# Patient Record
Sex: Female | Born: 1985 | Hispanic: Yes | Marital: Married | Smoking: Never smoker
Health system: Southern US, Community
[De-identification: ages and names within clinical notes are randomized; demographics above are authoritative.]

## PROBLEM LIST (undated history)

## (undated) ENCOUNTER — Inpatient Hospital Stay (HOSPITAL_COMMUNITY): Payer: Self-pay

## (undated) DIAGNOSIS — O09299 Supervision of pregnancy with other poor reproductive or obstetric history, unspecified trimester: Secondary | ICD-10-CM

## (undated) DIAGNOSIS — O139 Gestational [pregnancy-induced] hypertension without significant proteinuria, unspecified trimester: Secondary | ICD-10-CM

## (undated) DIAGNOSIS — Z789 Other specified health status: Secondary | ICD-10-CM

## (undated) HISTORY — DX: Supervision of pregnancy with other poor reproductive or obstetric history, unspecified trimester: O09.299

## (undated) HISTORY — PX: APPENDECTOMY: SHX54

---

## 2011-06-12 NOTE — L&D Delivery Note (Signed)
Attestation of Attending Supervision of Resident: Evaluation and management procedures were performed by the [facility] Resident under my supervision.  I have seen and examined the patient, reviewed the resident's note and chart, and I agree with the management and plan.  I was present for entire second and third stage of labor.  There were no complications.  Anibal Henderson, M.D. 03/29/2012 1:40 AM

## 2011-06-12 NOTE — L&D Delivery Note (Signed)
Delivery Note At 8:16 PM a viable and healthy female was delivered via Vaginal, Spontaneous Delivery (Presentation: Occiput Anterior).  APGAR: 8,9; weight 7 lb 1.6 oz (3221 g).   Placenta status: spontaneous, intact, with significant calcification.  Cord: 3-vessel.     Anesthesia: Epidural  Episiotomy:none   Lacerations: First degree vaginal laceration (repaired); bilateral periurethral lacerations, hemostatic (unrepaired) Suture Repair: 2.0 vicryl Est. Blood Loss (mL): 700  Mom to postpartum.  Baby to mother's skin. stable.  Felix Pacini 03/28/2012, 8:37 PM

## 2011-08-10 ENCOUNTER — Encounter (HOSPITAL_COMMUNITY): Payer: Self-pay | Admitting: *Deleted

## 2011-08-10 ENCOUNTER — Inpatient Hospital Stay (HOSPITAL_COMMUNITY)
Admission: AD | Admit: 2011-08-10 | Discharge: 2011-08-10 | Disposition: A | Discharge status: Home Health | Payer: Self-pay | Source: Ambulatory Visit | Attending: Family Medicine | Admitting: Family Medicine

## 2011-08-10 ENCOUNTER — Inpatient Hospital Stay (HOSPITAL_COMMUNITY): Payer: Self-pay

## 2011-08-10 DIAGNOSIS — B9689 Other specified bacterial agents as the cause of diseases classified elsewhere: Secondary | ICD-10-CM | POA: Insufficient documentation

## 2011-08-10 DIAGNOSIS — N760 Acute vaginitis: Secondary | ICD-10-CM | POA: Insufficient documentation

## 2011-08-10 DIAGNOSIS — O2390 Unspecified genitourinary tract infection in pregnancy, unspecified trimester: Secondary | ICD-10-CM | POA: Insufficient documentation

## 2011-08-10 DIAGNOSIS — O4690 Antepartum hemorrhage, unspecified, unspecified trimester: Secondary | ICD-10-CM

## 2011-08-10 DIAGNOSIS — A499 Bacterial infection, unspecified: Secondary | ICD-10-CM | POA: Insufficient documentation

## 2011-08-10 DIAGNOSIS — O209 Hemorrhage in early pregnancy, unspecified: Secondary | ICD-10-CM | POA: Insufficient documentation

## 2011-08-10 HISTORY — DX: Other specified health status: Z78.9

## 2011-08-10 LAB — URINALYSIS, ROUTINE W REFLEX MICROSCOPIC
Bilirubin Urine: NEGATIVE
Ketones, ur: NEGATIVE mg/dL
Nitrite: NEGATIVE
Protein, ur: NEGATIVE mg/dL
Urobilinogen, UA: 0.2 mg/dL (ref 0.0–1.0)
pH: 7 (ref 5.0–8.0)

## 2011-08-10 LAB — CBC
HCT: 39 % (ref 36.0–46.0)
Hemoglobin: 13.2 g/dL (ref 12.0–15.0)
MCH: 29.7 pg (ref 26.0–34.0)
MCHC: 33.8 g/dL (ref 30.0–36.0)
MCV: 87.8 fL (ref 78.0–100.0)
Platelets: 234 K/uL (ref 150–400)
RBC: 4.44 MIL/uL (ref 3.87–5.11)
RDW: 12.6 % (ref 11.5–15.5)
WBC: 13.2 K/uL — ABNORMAL HIGH (ref 4.0–10.5)

## 2011-08-10 LAB — WET PREP, GENITAL
Trich, Wet Prep: NONE SEEN
Yeast Wet Prep HPF POC: NONE SEEN

## 2011-08-10 LAB — ABO/RH: ABO/RH(D): AB NEG

## 2011-08-10 MED ORDER — METRONIDAZOLE 500 MG PO TABS: 500.0000 mg | ORAL_TABLET | Freq: Two times a day (BID) | ORAL | Status: AC

## 2011-08-10 MED ORDER — RHO D IMMUNE GLOBULIN 1500 UNIT/2ML IJ SOLN
300.0000 ug | Freq: Once | INTRAMUSCULAR | Status: AC
  Administered 2011-08-10: 300 ug via INTRAMUSCULAR

## 2011-08-10 MED ORDER — ACETAMINOPHEN-CODEINE #3 300-30 MG PO TABS
1.0000 | ORAL_TABLET | Freq: Once | ORAL | Status: AC
  Administered 2011-08-10: 1 via ORAL
  Filled 2011-08-10: qty 1

## 2011-08-10 MED ORDER — FLUCONAZOLE 150 MG PO TABS
150.0000 mg | ORAL_TABLET | Freq: Once | ORAL | Status: AC
  Administered 2011-08-10: 150 mg via ORAL
  Filled 2011-08-10: qty 1

## 2011-08-10 NOTE — ED Provider Notes (Signed)
History     Chief Complaint  Patient presents with  . Vaginal Bleeding  . Abdominal Cramping   HPI  Patient reports light bleeding on toilet paper at 10:00am with wiping. She is also having mild cramping that started at the same time.  No UTI symptoms.  +vaginal itching, no abnormal discharge.     Past Medical History  Diagnosis Date  . No pertinent past medical history     History reviewed. No pertinent past surgical history.  History reviewed. No pertinent family history.  History  Substance Use Topics  . Smoking status: Never Smoker   . Smokeless tobacco: Not on file  . Alcohol Use: No    Allergies: No Known Allergies  Prescriptions prior to admission  Medication Sig Dispense Refill  . Prenatal Vit-Fe Fumarate-FA (PRENATAL MULTIVITAMIN) TABS Take 1 tablet by mouth daily.        Review of Systems  Gastrointestinal: Positive for abdominal pain (cramping).  Genitourinary:       Vaginal spotting; vaginal itching  All other systems reviewed and are negative.   Physical Exam   Blood pressure 105/63, pulse 86, temperature 99.1 F (37.3 C), temperature source Oral, resp. rate 18, height 5' 4.5" (1.638 m), weight 75.524 kg (166 lb 8 oz), last menstrual period 06/14/2011, SpO2 100.00%.  Physical Exam  Constitutional: She is oriented to person, place, and time. She appears well-developed and well-nourished. No distress.  HENT:  Head: Normocephalic.  Neck: Normal range of motion. Neck supple.  Cardiovascular: Normal rate, regular rhythm and normal heart sounds.   Respiratory: Effort normal and breath sounds normal. No respiratory distress.  GI: Soft. She exhibits no mass. There is no tenderness. There is no rebound and no guarding.  Genitourinary: There is bleeding (scant) around the vagina.  Neurological: She is alert and oriented to person, place, and time.  Skin: Skin is warm and dry.    MAU Course  Procedures  Results for orders placed during the hospital  encounter of 08/10/11 (from the past 24 hour(s))  URINALYSIS, ROUTINE W REFLEX MICROSCOPIC     Status: Abnormal   Collection Time   08/10/11  3:45 PM      Component Value Range   Color, Urine YELLOW  YELLOW    APPearance HAZY (*) CLEAR    Specific Gravity, Urine 1.015  1.005 - 1.030    pH 7.0  5.0 - 8.0    Glucose, UA NEGATIVE  NEGATIVE (mg/dL)   Hgb urine dipstick LARGE (*) NEGATIVE    Bilirubin Urine NEGATIVE  NEGATIVE    Ketones, ur NEGATIVE  NEGATIVE (mg/dL)   Protein, ur NEGATIVE  NEGATIVE (mg/dL)   Urobilinogen, UA 0.2  0.0 - 1.0 (mg/dL)   Nitrite NEGATIVE  NEGATIVE    Leukocytes, UA TRACE (*) NEGATIVE   URINE MICROSCOPIC-ADD ON     Status: Abnormal   Collection Time   08/10/11  3:45 PM      Component Value Range   Squamous Epithelial / LPF MANY (*) RARE    WBC, UA 0-2  <3 (WBC/hpf)   Bacteria, UA FEW (*) RARE   ABO/RH     Status: Normal   Collection Time   08/10/11  5:37 PM      Component Value Range   ABO/RH(D) AB NEG    RH IG WORKUP     Status: Normal (Preliminary result)   Collection Time   08/10/11  5:37 PM      Component Value Range   Gestational  Age(Wks) 7     ABO/RH(D) AB NEG     Antibody Screen NEG     Unit Number [id number]     Blood Component Type RHIG     Unit division 00     Status of Unit ISSUED     Transfusion Status OK TO TRANSFUSE    CBC     Status: Abnormal   Collection Time   08/10/11  5:38 PM      Component Value Range   WBC 13.2 (*) 4.0 - 10.5 (K/uL)   RBC 4.44  3.87 - 5.11 (MIL/uL)   Hemoglobin 13.2  12.0 - 15.0 (g/dL)   HCT 63.0  16.0 - 10.9 (%)   MCV 87.8  78.0 - 100.0 (fL)   MCH 29.7  26.0 - 34.0 (pg)   MCHC 33.8  30.0 - 36.0 (g/dL)   RDW 32.3  55.7 - 32.2 (%)   Platelets 234  150 - 400 (K/uL)  HCG, QUANTITATIVE, PREGNANCY     Status: Abnormal   Collection Time   08/10/11  5:42 PM      Component Value Range   hCG, Beta Chain, Quant, S 02542 (*) <5 (mIU/mL)  WET PREP, GENITAL     Status: Abnormal   Collection Time   08/10/11  5:45 PM       Component Value Range   Yeast Wet Prep HPF POC NONE SEEN  NONE SEEN    Trich, Wet Prep NONE SEEN  NONE SEEN    Clue Cells Wet Prep HPF POC FEW (*) NONE SEEN    WBC, Wet Prep HPF POC FEW (*) NONE SEEN      Assessment and Plan  Viable Intrauterine Pregnancy Bleeding in Early Pregnancy Bacterial Vaginosis  Plan: DC to home RX Flagyl Begin prenatal care Bleeding precautions  [facility] 08/10/2011, 5:35 PM

## 2011-08-10 NOTE — Progress Notes (Signed)
Patient states she took a positive home pregnancy test on Feb. 13, 2013 and followed up with positive pregnancy test at [facility] on Feb. 18, 2013.  Patient started some light bleeding on toilet paper at 10:00 am only when she wipes. She is also having mild cramping that started at the same time.

## 2011-08-10 NOTE — Progress Notes (Signed)
Pt states, " I started having light vaginal bleeding at 10 am today with low abdominal cramping. This same happened two weeks ago."

## 2011-08-10 NOTE — Discharge Instructions (Signed)
Prenatal Care Providers °[facility]    [facility] ° Phone- [phone]     Phone: [phone] °         °[facility]                      [facility] ° @[place]     Phone: [phone] ° Phone: [phone] °        [facility] °[facility]     Phone: [phone] ° Phone: [phone]   °        [facility] °[facility] [place]                hone: [phone] ° Phone: [phone] °        [facility] °Dr. Bernard Marshall      Phone: [phone] ° Phone: [phone] °        [facility] °[facility]                Phone: [phone] ° [facility]’s [facility]  ° Phone:[phone]    [facility] °         Phone: [phone] °[facility] °  Phone: [phone] ° ° °Bacterial Vaginosis °Bacterial vaginosis (BV) is a vaginal infection where the normal balance of bacteria in the vagina is disrupted. The normal balance is then replaced by an overgrowth of certain bacteria. There are several different kinds of bacteria that can cause BV. BV is the most common vaginal infection in women of childbearing age. °CAUSES  °· The cause of BV is not fully understood. BV develops when there is an increase or imbalance of harmful bacteria.  °· Some activities or behaviors can upset the normal balance of bacteria in the vagina and put women at increased risk including:  °· Having a new sex partner or multiple sex partners.  °· Douching.  °· Using an intrauterine device (IUD) for contraception.  °· It is not clear what role sexual activity plays in the development of BV. However, women that have never had sexual intercourse are rarely infected with BV.  °Women do not get BV from toilet seats, bedding, swimming pools or from touching objects around them.  °SYMPTOMS  °· Grey vaginal discharge.  °· A fish-like odor with discharge, especially after sexual intercourse.   °· Itching or burning of the vagina and vulva.  °· Burning or pain with urination.  °· Some women have no signs or symptoms at all.  °DIAGNOSIS  °Your caregiver must examine the vagina for signs of BV. Your caregiver will perform lab tests and look at the sample of vaginal fluid through a microscope. They will look for bacteria and abnormal cells (clue cells), a pH test higher than 4.5, and a positive amine test all associated with BV.  °RISKS AND COMPLICATIONS  °· Pelvic inflammatory disease (PID).  °· Infections following gynecology surgery.  °· Developing HIV.  °· Developing herpes virus.  °TREATMENT  °Sometimes BV will clear up without treatment. However, all women with symptoms of BV should be treated to avoid complications, especially if gynecology surgery is planned. Female partners generally do not need to be treated. However, BV may spread between female sex partners so treatment is helpful in preventing a recurrence of BV.  °· BV may be treated with antibiotics. The antibiotics come in either pill or vaginal cream forms.   be used with nonpregnant or pregnant women, but the recommended dosages differ. These antibiotics are not harmful to the baby.   BV can recur after treatment. If this happens, a second round of antibiotics will often be prescribed.   Treatment is important for pregnant women. If not treated, BV can cause a premature delivery, especially for a pregnant woman who had a premature birth in the past. All pregnant women who have symptoms of BV should be checked and treated.   For chronic reoccurrence of BV, treatment with a type of prescribed gel vaginally twice a week is helpful.  HOME CARE INSTRUCTIONS   Finish all medication as directed by your caregiver.   Do not have sex until treatment is completed.   Tell your sexual partner that you have a vaginal infection. They should see their caregiver and be treated if they have problems, such as a mild rash or itching.    Practice safe sex. Use condoms. Only have 1 sex partner.  PREVENTION  Basic prevention steps can help reduce the risk of upsetting the natural balance of bacteria in the vagina and developing BV:  Do not have sexual intercourse (be abstinent).   Do not douche.   Use all of the medicine prescribed for treatment of BV, even if the signs and symptoms go away.   Tell your sex partner if you have BV. That way, they can be treated, if needed, to prevent reoccurrence.  SEEK MEDICAL CARE IF:   Your symptoms are not improving after 3 days of treatment.   You have increased discharge, pain, or fever.  MAKE SURE YOU:   Understand these instructions.   Will watch your condition.   Will get help right away if you are not doing well or get worse.  FOR MORE INFORMATION  Division of STD Prevention (DSTDP), Centers for Disease Control and Prevention: [employer] American Social Health Association (ASHA): www.ashastd.org  Document Released: 05/28/2005 Document Revised: 02/07/2011 Document Reviewed: 11/18/2008 [facility] Patient Information 2012 [place], [place].Bacterial Vaginosis Bacterial vaginosis (BV) is a vaginal infection where the normal balance of bacteria in the vagina is disrupted. The normal balance is then replaced by an overgrowth of certain bacteria. There are several different kinds of bacteria that can cause BV. BV is the most common vaginal infection in women of childbearing age. CAUSES   The cause of BV is not fully understood. BV develops when there is an increase or imbalance of harmful bacteria.   Some activities or behaviors can upset the normal balance of bacteria in the vagina and put women at increased risk including:   Having a new sex partner or multiple sex partners.   Douching.   Using an intrauterine device (IUD) for contraception.   It is not clear what role sexual activity plays in the development of BV. However, women that have never had sexual intercourse  are rarely infected with BV.  Women do not get BV from toilet seats, bedding, swimming pools or from touching objects around them.  SYMPTOMS   Grey vaginal discharge.   A fish-like odor with discharge, especially after sexual intercourse.   Itching or burning of the vagina and vulva.   Burning or pain with urination.   Some women have no signs or symptoms at all.  DIAGNOSIS  Your caregiver must examine the vagina for signs of BV. Your caregiver will perform lab tests and look at the sample of vaginal fluid through a microscope. They will look for bacteria and abnormal cells (clue  cells), a pH test higher than 4.5, and a positive amine test all associated with BV.  RISKS AND COMPLICATIONS   Pelvic inflammatory disease (PID).   Infections following gynecology surgery.   Developing HIV.   Developing herpes virus.  TREATMENT  Sometimes BV will clear up without treatment. However, all women with symptoms of BV should be treated to avoid complications, especially if gynecology surgery is planned. Female partners generally do not need to be treated. However, BV may spread between female sex partners so treatment is helpful in preventing a recurrence of BV.   BV may be treated with antibiotics. The antibiotics come in either pill or vaginal cream forms. Either can be used with nonpregnant or pregnant women, but the recommended dosages differ. These antibiotics are not harmful to the baby.   BV can recur after treatment. If this happens, a second round of antibiotics will often be prescribed.   Treatment is important for pregnant women. If not treated, BV can cause a premature delivery, especially for a pregnant woman who had a premature birth in the past. All pregnant women who have symptoms of BV should be checked and treated.   For chronic reoccurrence of BV, treatment with a type of prescribed gel vaginally twice a week is helpful.  HOME CARE INSTRUCTIONS   Finish all medication as  directed by your caregiver.   Do not have sex until treatment is completed.   Tell your sexual partner that you have a vaginal infection. They should see their caregiver and be treated if they have problems, such as a mild rash or itching.   Practice safe sex. Use condoms. Only have 1 sex partner.  PREVENTION  Basic prevention steps can help reduce the risk of upsetting the natural balance of bacteria in the vagina and developing BV:  Do not have sexual intercourse (be abstinent).   Do not douche.   Use all of the medicine prescribed for treatment of BV, even if the signs and symptoms go away.   Tell your sex partner if you have BV. That way, they can be treated, if needed, to prevent reoccurrence.  SEEK MEDICAL CARE IF:   Your symptoms are not improving after 3 days of treatment.   You have increased discharge, pain, or fever.  MAKE SURE YOU:   Understand these instructions.   Will watch your condition.   Will get help right away if you are not doing well or get worse.  FOR MORE INFORMATION  Division of STD Prevention (DSTDP), Centers for Disease Control and Prevention: [employer]Apps[employer] American Social Health Association (ASHA): www.ashastd.org  Document Released: 05/28/2005 Document Revised: 02/07/2011 Document Reviewed: 11/18/2008 [facility] Patient Information 2012 [place], [place].

## 2011-08-11 LAB — RH IG WORKUP (INCLUDES ABO/RH)
ABO/RH(D): AB NEG
Antibody Screen: NEGATIVE
Unit division: 0

## 2011-08-11 LAB — GC/CHLAMYDIA PROBE AMP, GENITAL
Chlamydia, DNA Probe: NEGATIVE
GC Probe Amp, Genital: NEGATIVE

## 2011-08-13 LAB — URINE CULTURE
Colony Count: 65000
Culture  Setup Time: 201303020247

## 2011-08-15 ENCOUNTER — Telehealth: Payer: Self-pay | Admitting: Advanced Practice Midwife

## 2011-08-15 NOTE — Telephone Encounter (Signed)
Pt to obtain [facility] records to begin prenatal care in [facility].

## 2011-08-19 NOTE — ED Provider Notes (Signed)
Chart reviewed and agree with management and plan.  

## 2011-08-20 LAB — OB RESULTS CONSOLE RPR: RPR: NONREACTIVE

## 2011-08-20 LAB — OB RESULTS CONSOLE RUBELLA ANTIBODY, IGM: Rubella: NON-IMMUNE/NOT IMMUNE

## 2011-08-20 LAB — OB RESULTS CONSOLE HIV ANTIBODY (ROUTINE TESTING): HIV: NONREACTIVE

## 2011-08-20 LAB — OB RESULTS CONSOLE ABO/RH: RH Type: NEGATIVE

## 2011-08-21 ENCOUNTER — Encounter: Payer: Self-pay | Admitting: Family

## 2011-08-21 DIAGNOSIS — O2340 Unspecified infection of urinary tract in pregnancy, unspecified trimester: Secondary | ICD-10-CM

## 2011-08-21 DIAGNOSIS — B951 Streptococcus, group B, as the cause of diseases classified elsewhere: Secondary | ICD-10-CM | POA: Insufficient documentation

## 2011-10-04 ENCOUNTER — Telehealth: Payer: Self-pay | Admitting: Family

## 2011-10-04 MED ORDER — PENICILLIN V POTASSIUM 500 MG PO TABS: 500.0000 mg | ORAL_TABLET | Freq: Three times a day (TID) | ORAL | Status: AC

## 2011-10-04 NOTE — Telephone Encounter (Signed)
Pt has a UTI; RX sent to [facility] on [address] for penicillin, three times a day x 7 days.

## 2011-10-15 ENCOUNTER — Other Ambulatory Visit (HOSPITAL_COMMUNITY): Payer: Self-pay | Admitting: *Deleted

## 2011-10-15 DIAGNOSIS — Z3689 Encounter for other specified antenatal screening: Secondary | ICD-10-CM

## 2011-11-06 ENCOUNTER — Ambulatory Visit (HOSPITAL_COMMUNITY)
Admission: RE | Admit: 2011-11-06 | Discharge: 2011-11-06 | Disposition: A | Discharge status: Home / Self Care | Payer: Self-pay | Source: Ambulatory Visit | Attending: Family | Admitting: Family

## 2011-11-06 DIAGNOSIS — Z363 Encounter for antenatal screening for malformations: Secondary | ICD-10-CM | POA: Insufficient documentation

## 2011-11-06 DIAGNOSIS — Z1389 Encounter for screening for other disorder: Secondary | ICD-10-CM | POA: Insufficient documentation

## 2011-11-06 DIAGNOSIS — Z3689 Encounter for other specified antenatal screening: Secondary | ICD-10-CM

## 2011-11-06 DIAGNOSIS — O358XX0 Maternal care for other (suspected) fetal abnormality and damage, not applicable or unspecified: Secondary | ICD-10-CM | POA: Insufficient documentation

## 2012-03-03 ENCOUNTER — Other Ambulatory Visit (HOSPITAL_COMMUNITY): Payer: Self-pay | Admitting: Family

## 2012-03-03 DIAGNOSIS — O321XX0 Maternal care for breech presentation, not applicable or unspecified: Secondary | ICD-10-CM

## 2012-03-05 ENCOUNTER — Ambulatory Visit (HOSPITAL_COMMUNITY)
Admission: RE | Admit: 2012-03-05 | Discharge: 2012-03-05 | Disposition: A | Discharge status: Home / Self Care | Payer: Self-pay | Source: Ambulatory Visit | Attending: Family | Admitting: Family

## 2012-03-05 DIAGNOSIS — Z3689 Encounter for other specified antenatal screening: Secondary | ICD-10-CM | POA: Insufficient documentation

## 2012-03-05 DIAGNOSIS — O321XX0 Maternal care for breech presentation, not applicable or unspecified: Secondary | ICD-10-CM

## 2012-03-27 ENCOUNTER — Inpatient Hospital Stay (HOSPITAL_COMMUNITY)
Admission: AD | Admit: 2012-03-27 | Discharge: 2012-03-27 | Disposition: A | Discharge status: Home / Self Care | Payer: Self-pay | Source: Ambulatory Visit | Attending: Family Medicine | Admitting: Family Medicine

## 2012-03-27 ENCOUNTER — Encounter (HOSPITAL_COMMUNITY): Payer: Self-pay | Admitting: *Deleted

## 2012-03-27 ENCOUNTER — Inpatient Hospital Stay (HOSPITAL_COMMUNITY)
Admission: AD | Admit: 2012-03-27 | Discharge: 2012-03-30 | DRG: 774 | Disposition: A | Discharge status: Home / Self Care | Payer: Medicaid Other | Source: Ambulatory Visit | Attending: Family Medicine | Admitting: Family Medicine

## 2012-03-27 DIAGNOSIS — IMO0002 Reserved for concepts with insufficient information to code with codable children: Secondary | ICD-10-CM | POA: Diagnosis present

## 2012-03-27 DIAGNOSIS — O1404 Mild to moderate pre-eclampsia, complicating childbirth: Secondary | ICD-10-CM

## 2012-03-27 DIAGNOSIS — O99892 Other specified diseases and conditions complicating childbirth: Secondary | ICD-10-CM | POA: Diagnosis present

## 2012-03-27 DIAGNOSIS — Z22330 Carrier of Group B streptococcus: Secondary | ICD-10-CM

## 2012-03-27 DIAGNOSIS — IMO0001 Reserved for inherently not codable concepts without codable children: Secondary | ICD-10-CM

## 2012-03-27 DIAGNOSIS — O479 False labor, unspecified: Secondary | ICD-10-CM | POA: Insufficient documentation

## 2012-03-27 LAB — URINALYSIS, ROUTINE W REFLEX MICROSCOPIC
Glucose, UA: NEGATIVE mg/dL
Leukocytes, UA: NEGATIVE
Protein, ur: 100 mg/dL — AB
pH: 6 (ref 5.0–8.0)

## 2012-03-27 LAB — URINE MICROSCOPIC-ADD ON

## 2012-03-27 LAB — CBC
HCT: 39.1 % (ref 36.0–46.0)
Hemoglobin: 13.5 g/dL (ref 12.0–15.0)
MCH: 29.9 pg (ref 26.0–34.0)
MCHC: 34.5 g/dL (ref 30.0–36.0)
MCV: 86.7 fL (ref 78.0–100.0)
RBC: 4.51 MIL/uL (ref 3.87–5.11)

## 2012-03-27 NOTE — MAU Note (Signed)
Pt states she started having contractions at 0430 this morning. Pt states contractions are irregular 10-30 min.

## 2012-03-27 NOTE — MAU Provider Note (Signed)
History     CSN: [id number]  Arrival date and time: 03/27/12 1421   None     Chief Complaint  Patient presents with  . Contractions   HPI  Bridget Huffman 26 y.o. G1P0. [redacted]w[redacted]d   Patient presents today having contractions that began this morning at 04:30 and have been constant since then, every 15 minutes. She has been able to talk through these contractions. She has had no leaking or gush of fluid. The has been no vaginal bleeding. She reports normal fetal movement. [facility]. Her next appointment is tomorrow. She reports that they checked her cervix 2 weeks ago and there was no dilation. She reports no complications with this pregnancy; no HTN, diabetes, or pre-eclampsia.   OB History    Grav Para Term Preterm Abortions TAB SAB Ect Mult Living   1               Past Medical History  Diagnosis Date  . No pertinent past medical history     Past Surgical History  Procedure Date  . No past surgeries     Family History  Problem Relation Age of Onset  . Alcohol abuse Neg Hx   . Arthritis Neg Hx   . Asthma Neg Hx   . Birth defects Neg Hx   . Cancer Neg Hx   . COPD Neg Hx   . Depression Neg Hx   . Diabetes Neg Hx   . Drug abuse Neg Hx   . Early death Neg Hx   . Hearing loss Neg Hx   . Heart disease Neg Hx   . Hyperlipidemia Neg Hx   . Hypertension Neg Hx   . Learning disabilities Neg Hx   . Kidney disease Neg Hx   . Mental illness Neg Hx   . Mental retardation Neg Hx   . Miscarriages / Stillbirths Neg Hx   . Stroke Neg Hx   . Vision loss Neg Hx     History  Substance Use Topics  . Smoking status: Never Smoker   . Smokeless tobacco: Not on file  . Alcohol Use: No    Allergies: No Known Allergies  Prescriptions prior to admission  Medication Sig Dispense Refill  . omega-3 acid ethyl esters (LOVAZA) 1 G capsule Take 1 g by mouth daily.      . Prenatal Vit-Fe Fumarate-FA (PRENATAL MULTIVITAMIN) TABS Take 1 tablet by  mouth daily.        Review of Systems  Constitutional: Negative for fever and chills.  Eyes: Negative for blurred vision and double vision.  Respiratory: Negative for shortness of breath.   Cardiovascular: Negative for chest pain, palpitations and leg swelling.  Gastrointestinal: Positive for abdominal pain. Negative for nausea, vomiting and diarrhea.  Genitourinary: Negative for dysuria, urgency and frequency.  Musculoskeletal: Negative for myalgias.  Neurological: Negative for dizziness, loss of consciousness and headaches.   Physical Exam   Blood pressure 142/88, pulse 74, temperature 98.8 F (37.1 C), temperature source Oral, resp. rate 18, height 5\' 3"  (1.6 m), weight 100.018 kg (220 lb 8 oz), last menstrual period 06/14/2011, SpO2 100.00%.  Physical Exam  Nursing note and vitals reviewed. Constitutional: She is oriented to person, place, and time. She appears well-developed and well-nourished. No distress (appears comfortable).  HENT:  Head: Normocephalic.  Eyes: Conjunctivae normal and EOM are normal. No scleral icterus.  Neck: Normal range of motion. Neck supple.  Cardiovascular: Normal rate, regular rhythm, normal heart  sounds and intact distal pulses.  Exam reveals no gallop and no friction rub.   No murmur heard. Respiratory: Effort normal and breath sounds normal. No respiratory distress. She has no wheezes. She has no rales.  GI: There is tenderness.  Musculoskeletal: Normal range of motion. She exhibits no edema and no tenderness.  Neurological: She is alert and oriented to person, place, and time.  Skin: Skin is warm and dry. No rash noted. She is not diaphoretic. No erythema.  Psychiatric: She has a normal mood and affect. Her behavior is normal. Thought content normal.    MAU Course  Procedures  Fetal monitoring:  Fetal heart rate - 130, variables, no decels, contractions noted every 5 minutes (patient not perceiving all of these contractions)  Cervix: 1cm  (external os), soft, thick, high, very posterior  Assessment and Plan  26 y.o. G1P0000 at [redacted]w[redacted]d with mild-moderate contractions every 15 minutes.  Likely early labor with some softening of cervix but remote from delivery. Discharge home with labor precautions.   Follow up at [facility] tomorrow.  Winfield Cunas 03/27/2012, 4:05 PM   I saw and examined patient and reviewed NST. I agree with above. Napoleon Form, MD

## 2012-03-27 NOTE — MAU Note (Signed)
Patient is in for labor eval. She states that she is having contractions q80m. Denies lof or vaginal bleeding. She reports good fetal movement

## 2012-03-27 NOTE — MAU Note (Signed)
Pt was here earlier today for labor check and sent home.  She states contractions are not 5 min apart and have been for the past two hours.

## 2012-03-27 NOTE — MAU Provider Note (Signed)
History     CSN: [id number]  Arrival date and time: 03/27/12 2132   None     Chief Complaint  Patient presents with  . Contractions   HPI26 y.o. G1P0000 at [redacted]w[redacted]d with contractions since 4:30 this morning. Seen in MAU this morning, contractions 15 minutes apart, now 5 minutes apart. No bleeding, loss of fluid. Baby moving well.  Prenatal care at [facility] Department. Checked last week, not dilated. This morning dilated to 1, thick, very posterior. No complications with this pregnancy.  OB History    Grav Para Term Preterm Abortions TAB SAB Ect Mult Living   1 0 0 0 0 0 0 0 0 0       Past Medical History  Diagnosis Date  . No pertinent past medical history     Past Surgical History  Procedure Date  . No past surgeries     Fam Hx:  Non-contributory  History  Substance Use Topics  . Smoking status: Never Smoker   . Smokeless tobacco: Not on file  . Alcohol Use: No    Allergies: No Known Allergies  Prescriptions prior to admission  Medication Sig Dispense Refill  . omega-3 acid ethyl esters (LOVAZA) 1 G capsule Take 1 g by mouth daily.      . Prenatal Vit-Fe Fumarate-FA (PRENATAL MULTIVITAMIN) TABS Take 1 tablet by mouth daily.        Review of Systems  Constitutional: Negative for fever and chills.  Eyes: Negative for blurred vision and double vision.  Respiratory: Negative for cough and shortness of breath.   Cardiovascular: Negative for chest pain.  Gastrointestinal: Negative for nausea, vomiting, diarrhea and constipation.  Genitourinary: Negative for dysuria.  Musculoskeletal: Negative for back pain.  Neurological: Negative for dizziness, weakness and headaches.   Physical Exam   Blood pressure 141/96, pulse 76, temperature 98.3 F (36.8 C), temperature source Oral, resp. rate 20, height 5\' 3"  (1.6 m), weight 99.791 kg (220 lb), last menstrual period 06/14/2011.  Physical Exam  Constitutional: She is oriented to person, place, and time. She appears  well-developed and well-nourished. No distress.  HENT:  Head: Normocephalic and atraumatic.  Eyes: Conjunctivae normal and EOM are normal.  Neck: Normal range of motion. Neck supple.  Cardiovascular: Normal rate, regular rhythm and normal heart sounds.   Respiratory: Effort normal and breath sounds normal. No respiratory distress.  GI: Soft. Bowel sounds are normal. There is no tenderness. There is no rebound.  Genitourinary: Vagina normal and uterus normal.  Musculoskeletal: Normal range of motion. She exhibits no edema and no tenderness.  Neurological: She is alert and oriented to person, place, and time.  Skin: Skin is warm and dry.  Psychiatric: She has a normal mood and affect.   Results for orders placed during the hospital encounter of 03/27/12 (from the past 24 hour(s))  URINALYSIS, ROUTINE W REFLEX MICROSCOPIC     Status: Abnormal   Collection Time   03/27/12 10:31 PM      Component Value Range   Color, Urine YELLOW  YELLOW   APPearance CLEAR  CLEAR   Specific Gravity, Urine 1.015  1.005 - 1.030   pH 6.0  5.0 - 8.0   Glucose, UA NEGATIVE  NEGATIVE mg/dL   Hgb urine dipstick TRACE (*) NEGATIVE   Bilirubin Urine NEGATIVE  NEGATIVE   Ketones, ur NEGATIVE  NEGATIVE mg/dL   Protein, ur 409 (*) NEGATIVE mg/dL   Urobilinogen, UA 0.2  0.0 - 1.0 mg/dL   Nitrite NEGATIVE  NEGATIVE  Leukocytes, UA NEGATIVE  NEGATIVE  URINE MICROSCOPIC-ADD ON     Status: Abnormal   Collection Time   03/27/12 10:31 PM      Component Value Range   Squamous Epithelial / LPF FEW (*) RARE   WBC, UA 0-2  <3 WBC/hpf   RBC / HPF 0-2  <3 RBC/hpf   Bacteria, UA FEW (*) RARE  PROTEIN / CREATININE RATIO, URINE     Status: Abnormal   Collection Time   03/27/12 10:45 PM      Component Value Range   Creatinine, Urine 45.35     Total Protein, Urine 86.9     PROTEIN CREATININE RATIO 1.92 (*) 0.00 - 0.15  COMPREHENSIVE METABOLIC PANEL     Status: Abnormal   Collection Time   03/27/12 11:35 PM       Component Value Range   Sodium 139  135 - 145 mEq/L   Potassium 3.8  3.5 - 5.1 mEq/L   Chloride 103  96 - 112 mEq/L   CO2 22  19 - 32 mEq/L   Glucose, Bld 85  70 - 99 mg/dL   BUN 8  6 - 23 mg/dL   Creatinine, Ser 5.40  0.50 - 1.10 mg/dL   Calcium 9.1  8.4 - 98.1 mg/dL   Total Protein 6.1  6.0 - 8.3 g/dL   Albumin 2.9 (*) 3.5 - 5.2 g/dL   AST 17  0 - 37 U/L   ALT 14  0 - 35 U/L   Alkaline Phosphatase 340 (*) 39 - 117 U/L   Total Bilirubin 0.2 (*) 0.3 - 1.2 mg/dL   GFR calc non Af Amer >90  >90 mL/min   GFR calc Af Amer >90  >90 mL/min  CBC     Status: Abnormal   Collection Time   03/27/12 11:35 PM      Component Value Range   WBC 15.1 (*) 4.0 - 10.5 K/uL   RBC 4.51  3.87 - 5.11 MIL/uL   Hemoglobin 13.5  12.0 - 15.0 g/dL   HCT 19.1  47.8 - 29.5 %   MCV 86.7  78.0 - 100.0 fL   MCH 29.9  26.0 - 34.0 pg   MCHC 34.5  30.0 - 36.0 g/dL   RDW 62.1  30.8 - 65.7 %   Platelets 176  150 - 400 K/uL  RPR     Status: Normal   Collection Time   03/27/12 11:35 PM      Component Value Range   RPR NON REACTIVE  NON REACTIVE     MAU Course  Procedures    Assessment and Plan  26 y.o. G1P0000 at [redacted]w[redacted]d with false labor vs early labor. Walk, recheck. High BP:  Four BP in 140s/90s and on earlier this morning. No BP over 130s/80s at prenatal visits.  100 mg/dl Proteinuria, has had only trace or no protein in office.   Admit to L&D, induce/augment labr  Napoleon Form 03/27/2012, 10:36 PM

## 2012-03-27 NOTE — MAU Note (Signed)
Patient is in with c/o ct x q66m. She denies any vaginal bleeding or lof. She reports good fetal movement. She gets her [facility] care [facility] de[facility].

## 2012-03-28 ENCOUNTER — Inpatient Hospital Stay (HOSPITAL_COMMUNITY): Payer: Medicaid Other | Admitting: Anesthesiology

## 2012-03-28 ENCOUNTER — Encounter (HOSPITAL_COMMUNITY): Payer: Self-pay | Admitting: Anesthesiology

## 2012-03-28 ENCOUNTER — Encounter (HOSPITAL_COMMUNITY): Payer: Self-pay

## 2012-03-28 DIAGNOSIS — IMO0002 Reserved for concepts with insufficient information to code with codable children: Secondary | ICD-10-CM

## 2012-03-28 DIAGNOSIS — O9989 Other specified diseases and conditions complicating pregnancy, childbirth and the puerperium: Secondary | ICD-10-CM

## 2012-03-28 LAB — CBC
HCT: 35.5 % — ABNORMAL LOW (ref 36.0–46.0)
MCH: 29.6 pg (ref 26.0–34.0)
MCHC: 33.8 g/dL (ref 30.0–36.0)
MCV: 87.7 fL (ref 78.0–100.0)
RDW: 14.2 % (ref 11.5–15.5)

## 2012-03-28 LAB — PREPARE RBC (CROSSMATCH)

## 2012-03-28 LAB — COMPREHENSIVE METABOLIC PANEL
ALT: 14 U/L (ref 0–35)
AST: 17 U/L (ref 0–37)
Albumin: 2.9 g/dL — ABNORMAL LOW (ref 3.5–5.2)
CO2: 22 mEq/L (ref 19–32)
Calcium: 9.1 mg/dL (ref 8.4–10.5)
Creatinine, Ser: 0.56 mg/dL (ref 0.50–1.10)
Sodium: 139 mEq/L (ref 135–145)
Total Protein: 6.1 g/dL (ref 6.0–8.3)

## 2012-03-28 LAB — PROTEIN / CREATININE RATIO, URINE: Creatinine, Urine: 45.35 mg/dL

## 2012-03-28 MED ORDER — LACTATED RINGERS IV SOLN: 500.0000 mL | Freq: Once | INTRAVENOUS | Status: DC

## 2012-03-28 MED ORDER — IBUPROFEN 600 MG PO TABS
600.0000 mg | ORAL_TABLET | Freq: Four times a day (QID) | ORAL | Status: DC
  Administered 2012-03-29 – 2012-03-30 (×7): 600 mg via ORAL
  Filled 2012-03-28 (×7): qty 1

## 2012-03-28 MED ORDER — IBUPROFEN 600 MG PO TABS: 600.0000 mg | ORAL_TABLET | Freq: Four times a day (QID) | ORAL | Status: DC | PRN

## 2012-03-28 MED ORDER — LABETALOL HCL 5 MG/ML IV SOLN
20.0000 mg | INTRAVENOUS | Status: DC | PRN
  Filled 2012-03-28: qty 4

## 2012-03-28 MED ORDER — SIMETHICONE 80 MG PO CHEW: 80.0000 mg | CHEWABLE_TABLET | ORAL | Status: DC | PRN

## 2012-03-28 MED ORDER — ONDANSETRON HCL 4 MG/2ML IJ SOLN: 4.0000 mg | INTRAMUSCULAR | Status: DC | PRN

## 2012-03-28 MED ORDER — OXYCODONE-ACETAMINOPHEN 5-325 MG PO TABS: 1.0000 | ORAL_TABLET | ORAL | Status: DC | PRN

## 2012-03-28 MED ORDER — ONDANSETRON HCL 4 MG/2ML IJ SOLN
4.0000 mg | Freq: Four times a day (QID) | INTRAMUSCULAR | Status: DC | PRN
  Administered 2012-03-28: 4 mg via INTRAVENOUS
  Filled 2012-03-28: qty 2

## 2012-03-28 MED ORDER — MAGNESIUM SULFATE 40 G IN LACTATED RINGERS - SIMPLE
2.0000 g/h | INTRAVENOUS | Status: AC
  Administered 2012-03-29: 2 g/h via INTRAVENOUS
  Filled 2012-03-28: qty 500

## 2012-03-28 MED ORDER — FENTANYL 2.5 MCG/ML BUPIVACAINE 1/10 % EPIDURAL INFUSION (WH - ANES)
INTRAMUSCULAR | Status: AC
  Filled 2012-03-28: qty 125

## 2012-03-28 MED ORDER — MISOPROSTOL 200 MCG PO TABS: 800.0000 ug | ORAL_TABLET | Freq: Once | ORAL | Status: DC

## 2012-03-28 MED ORDER — SENNOSIDES-DOCUSATE SODIUM 8.6-50 MG PO TABS
2.0000 | ORAL_TABLET | Freq: Every day | ORAL | Status: DC
  Administered 2012-03-28 – 2012-03-29 (×2): 2 via ORAL
  Filled 2012-03-28: qty 2

## 2012-03-28 MED ORDER — PHENYLEPHRINE 40 MCG/ML (10ML) SYRINGE FOR IV PUSH (FOR BLOOD PRESSURE SUPPORT): 80.0000 ug | PREFILLED_SYRINGE | INTRAVENOUS | Status: DC | PRN

## 2012-03-28 MED ORDER — PENICILLIN G POTASSIUM 5000000 UNITS IJ SOLR
5.0000 10*6.[IU] | Freq: Once | INTRAVENOUS | Status: AC
  Administered 2012-03-28: 5 10*6.[IU] via INTRAVENOUS
  Filled 2012-03-28: qty 5

## 2012-03-28 MED ORDER — DEXTROSE 5 % IV SOLN
2.5000 10*6.[IU] | INTRAVENOUS | Status: DC
  Administered 2012-03-28 (×4): 2.5 10*6.[IU] via INTRAVENOUS
  Filled 2012-03-28 (×6): qty 2.5

## 2012-03-28 MED ORDER — ONDANSETRON HCL 4 MG PO TABS: 4.0000 mg | ORAL_TABLET | ORAL | Status: DC | PRN

## 2012-03-28 MED ORDER — INFLUENZA VIRUS VACC SPLIT PF IM SUSP
0.5000 mL | INTRAMUSCULAR | Status: AC
  Administered 2012-03-30: 0.5 mL via INTRAMUSCULAR
  Filled 2012-03-28 (×2): qty 0.5

## 2012-03-28 MED ORDER — DIPHENHYDRAMINE HCL 50 MG/ML IJ SOLN: 12.5000 mg | INTRAMUSCULAR | Status: DC | PRN

## 2012-03-28 MED ORDER — LIDOCAINE HCL (PF) 1 % IJ SOLN
30.0000 mL | INTRAMUSCULAR | Status: DC | PRN
  Filled 2012-03-28: qty 30

## 2012-03-28 MED ORDER — CITRIC ACID-SODIUM CITRATE 334-500 MG/5ML PO SOLN: 30.0000 mL | ORAL | Status: DC | PRN

## 2012-03-28 MED ORDER — WITCH HAZEL-GLYCERIN EX PADS: 1.0000 "application " | MEDICATED_PAD | CUTANEOUS | Status: DC | PRN

## 2012-03-28 MED ORDER — MISOPROSTOL 200 MCG PO TABS
ORAL_TABLET | ORAL | Status: AC
  Administered 2012-03-28: 800 ug
  Filled 2012-03-28: qty 4

## 2012-03-28 MED ORDER — PRENATAL MULTIVITAMIN CH
1.0000 | ORAL_TABLET | Freq: Every day | ORAL | Status: DC
  Administered 2012-03-29 – 2012-03-30 (×2): 1 via ORAL
  Filled 2012-03-28 (×2): qty 1

## 2012-03-28 MED ORDER — LIDOCAINE HCL (PF) 1 % IJ SOLN
INTRAMUSCULAR | Status: DC | PRN
  Administered 2012-03-28 (×3): 4 mL

## 2012-03-28 MED ORDER — EPHEDRINE 5 MG/ML INJ: 10.0000 mg | INTRAVENOUS | Status: DC | PRN

## 2012-03-28 MED ORDER — FENTANYL 2.5 MCG/ML BUPIVACAINE 1/10 % EPIDURAL INFUSION (WH - ANES)
14.0000 mL/h | INTRAMUSCULAR | Status: DC
  Administered 2012-03-28 (×3): 14 mL/h via EPIDURAL
  Filled 2012-03-28 (×2): qty 125

## 2012-03-28 MED ORDER — OXYTOCIN BOLUS FROM INFUSION
500.0000 mL | INTRAVENOUS | Status: DC
  Administered 2012-03-28: 500 mL via INTRAVENOUS
  Filled 2012-03-28 (×72): qty 500

## 2012-03-28 MED ORDER — FENTANYL CITRATE 0.05 MG/ML IJ SOLN: 100.0000 ug | INTRAMUSCULAR | Status: DC | PRN

## 2012-03-28 MED ORDER — LANOLIN HYDROUS EX OINT: TOPICAL_OINTMENT | CUTANEOUS | Status: DC | PRN

## 2012-03-28 MED ORDER — TERBUTALINE SULFATE 1 MG/ML IJ SOLN: 0.2500 mg | Freq: Once | INTRAMUSCULAR | Status: DC | PRN

## 2012-03-28 MED ORDER — TETANUS-DIPHTH-ACELL PERTUSSIS 5-2.5-18.5 LF-MCG/0.5 IM SUSP
0.5000 mL | Freq: Once | INTRAMUSCULAR | Status: AC
  Administered 2012-03-29: 0.5 mL via INTRAMUSCULAR
  Filled 2012-03-28 (×2): qty 0.5

## 2012-03-28 MED ORDER — OXYTOCIN 40 UNITS IN LACTATED RINGERS INFUSION - SIMPLE MED
62.5000 mL/h | INTRAVENOUS | Status: DC
  Administered 2012-03-28 (×2): 62.5 mL/h via INTRAVENOUS

## 2012-03-28 MED ORDER — PHENYLEPHRINE 40 MCG/ML (10ML) SYRINGE FOR IV PUSH (FOR BLOOD PRESSURE SUPPORT)
PREFILLED_SYRINGE | INTRAVENOUS | Status: AC
  Filled 2012-03-28: qty 5

## 2012-03-28 MED ORDER — OXYCODONE-ACETAMINOPHEN 5-325 MG PO TABS
1.0000 | ORAL_TABLET | ORAL | Status: DC | PRN
  Administered 2012-03-30 (×3): 1 via ORAL
  Filled 2012-03-28 (×3): qty 1

## 2012-03-28 MED ORDER — LACTATED RINGERS IV SOLN
INTRAVENOUS | Status: DC
  Administered 2012-03-28 (×2): via INTRAVENOUS

## 2012-03-28 MED ORDER — OXYTOCIN 40 UNITS IN LACTATED RINGERS INFUSION - SIMPLE MED
1.0000 m[IU]/min | INTRAVENOUS | Status: DC
  Administered 2012-03-28: 2 m[IU]/min via INTRAVENOUS
  Filled 2012-03-28 (×2): qty 1000

## 2012-03-28 MED ORDER — MAGNESIUM SULFATE BOLUS VIA INFUSION
6.0000 g | Freq: Once | INTRAVENOUS | Status: AC
  Administered 2012-03-28: 6 g via INTRAVENOUS
  Filled 2012-03-28: qty 500

## 2012-03-28 MED ORDER — LACTATED RINGERS IV SOLN: 500.0000 mL | INTRAVENOUS | Status: DC | PRN

## 2012-03-28 MED ORDER — MAGNESIUM SULFATE 40 G IN LACTATED RINGERS - SIMPLE
2.0000 g/h | INTRAVENOUS | Status: DC
  Administered 2012-03-28 (×2): 2 g/h via INTRAVENOUS
  Filled 2012-03-28 (×2): qty 500

## 2012-03-28 MED ORDER — OXYTOCIN 40 UNITS IN LACTATED RINGERS INFUSION - SIMPLE MED: 62.5000 mL/h | INTRAVENOUS | Status: DC | PRN

## 2012-03-28 MED ORDER — ACETAMINOPHEN 325 MG PO TABS: 650.0000 mg | ORAL_TABLET | ORAL | Status: DC | PRN

## 2012-03-28 MED ORDER — BENZOCAINE-MENTHOL 20-0.5 % EX AERO
1.0000 "application " | INHALATION_SPRAY | CUTANEOUS | Status: DC | PRN
  Administered 2012-03-30: 1 via TOPICAL
  Filled 2012-03-28 (×2): qty 56

## 2012-03-28 MED ORDER — DIBUCAINE 1 % RE OINT
1.0000 "application " | TOPICAL_OINTMENT | RECTAL | Status: DC | PRN
  Filled 2012-03-28: qty 28

## 2012-03-28 MED ORDER — DIPHENHYDRAMINE HCL 25 MG PO CAPS: 25.0000 mg | ORAL_CAPSULE | Freq: Four times a day (QID) | ORAL | Status: DC | PRN

## 2012-03-28 MED ORDER — EPHEDRINE 5 MG/ML INJ
INTRAVENOUS | Status: AC
  Filled 2012-03-28: qty 4

## 2012-03-28 MED ORDER — ZOLPIDEM TARTRATE 5 MG PO TABS: 5.0000 mg | ORAL_TABLET | Freq: Every evening | ORAL | Status: DC | PRN

## 2012-03-28 MED ORDER — OXYTOCIN 10 UNIT/ML IJ SOLN: 40.0000 [IU] | INTRAVENOUS | Status: DC

## 2012-03-28 NOTE — Progress Notes (Signed)
Patient ID: Bridget Huffman, female   DOB: Jul 31, 1985, 26 y.o.   MRN: [id number] Bridget Huffman is a 26 y.o. G1P0000 at [redacted]w[redacted]d admitted for IOL due to pre-eclampsia  Subjective: She is feeling a little pressure. She is tired.  Objective: BP 155/76  Pulse 102  Temp 99.7 F (37.6 C) (Oral)  Resp 18  Ht 5\' 3"  (1.6 m)  Wt 99.791 kg (220 lb)  BMI 38.97 kg/m2  SpO2 98%  LMP 06/14/2011 I/O last 3 completed shifts: In: 905 [P.O.:240; I.V.:415; IV Piggyback:250] Out: 400 [Urine:400] Total I/O In: 2627.1 [P.O.:820; I.V.:1507.1; IV Piggyback:300] Out: 550 [Urine:550]  FHT:  FHR: 140 bpm, variability: moderate,  accelerations:  Present,  decelerations:  Absent UC:   Every 3-5 minutes SVE:   10/100/0 Labs: Lab Results  Component Value Date   WBC 15.1* 03/27/2012   HGB 13.5 03/27/2012   HCT 39.1 03/27/2012   MCV 86.7 03/27/2012   PLT 176 03/27/2012    Assessment / Plan: 26 y.o. G1P0000 at [redacted]w[redacted]d admitted for IOL due to pre-eclampsia AROM at 0905, clear fluid Labor: start pitocin Preeclampsia:  on magnesium sulfate and no signs or symptoms of toxicity Fetal Wellbeing:  Category I Pain Control:  Epidural I/D:  GBS+, PCN Anticipated MOD:  NSVD  Vamsi Apfel 03/28/2012, 5:34 PM

## 2012-03-28 NOTE — Progress Notes (Signed)
Bridget Huffman is a 26 y.o. G1P0000 at [redacted]w[redacted]d admitted for IOL due to pre-eclampsia  Subjective: Epidural placed  Objective: BP 143/75  Pulse 101  Temp 98.3 F (36.8 C) (Oral)  Resp 18  Ht 5\' 3"  (1.6 m)  Wt 99.791 kg (220 lb)  BMI 38.97 kg/m2  SpO2 98%  LMP 06/14/2011   Total I/O In: 635 [P.O.:120; I.V.:265; IV Piggyback:250] Out: -   FHT:  FHR: 125 bpm, variability: moderate,  accelerations:  Present,  decelerations:  Absent UC:   none SVE:   Dilation: 3 Effacement (%): 20 Station: -2 Exam by:: Erasmo Downer RN  Labs: Lab Results  Component Value Date   WBC 15.1* 03/27/2012   HGB 13.5 03/27/2012   HCT 39.1 03/27/2012   MCV 86.7 03/27/2012   PLT 176 03/27/2012    Assessment / Plan: 26 y.o. G1P0000 at [redacted]w[redacted]d admitted for IOL due to pre-eclampsia  Labor: start pitocin Preeclampsia:  on magnesium sulfate and no signs or symptoms of toxicity Fetal Wellbeing:  Category I Pain Control:  Epidural I/D:  GBS+, PCN Anticipated MOD:  NSVD  Sonia Side 03/28/2012, 5:21 AM

## 2012-03-28 NOTE — Progress Notes (Addendum)
Patient ID: Bridget Huffman, female   DOB: Dec 18, 1985, 26 y.o.   MRN: [id number] Bridget Huffman is a 26 y.o. G1P0000 at [redacted]w[redacted]d admitted for IOL due to pre-eclampsia  Subjective: Patient is comfortable wit no complaints. Blood pressures are stable.  Objective: BP 131/87  Pulse 92  Temp 98.1 F (36.7 C) (Oral)  Resp 18  Ht 5\' 3"  (1.6 m)  Wt 99.791 kg (220 lb)  BMI 38.97 kg/m2  SpO2 98%  LMP 06/14/2011 I/O last 3 completed shifts: In: 905 [P.O.:240; I.V.:415; IV Piggyback:250] Out: 400 [Urine:400] Total I/O In: 914.6 [P.O.:360; I.V.:454.6; IV Piggyback:100] Out: -   FHT:  FHR: 125 bpm, variability: moderate,  accelerations:  Present,  decelerations:  Absent UC:   Every 1-2 minutes SVE:   Dilation: 4.5 Effacement (%): 90 Station: -2 Exam by:: Dr. Claiborne Billings  Labs: Lab Results  Component Value Date   WBC 15.1* 03/27/2012   HGB 13.5 03/27/2012   HCT 39.1 03/27/2012   MCV 86.7 03/27/2012   PLT 176 03/27/2012    Assessment / Plan: 26 y.o. G1P0000 at [redacted]w[redacted]d admitted for IOL due to pre-eclampsia AROM at 0905, clear fluid Labor: start pitocin Preeclampsia:  on magnesium sulfate and no signs or symptoms of toxicity Fetal Wellbeing:  Category I Pain Control:  Epidural I/D:  GBS+, PCN Anticipated MOD:  NSVD  Kuneff, Renee 03/28/2012, 9:44 AM

## 2012-03-28 NOTE — MAU Note (Signed)
OUT TO WALK WITH INSTRUCTIONS AND FAMILY

## 2012-03-28 NOTE — Progress Notes (Signed)
Patient ID: [name], female   DOB: [birth year], 26 y.o.   MRN: [id number] Bridget Huffman is a 26 y.o. G1P0000 at [redacted]w[redacted]d admitted for IOL due to pre-eclampsia  Subjective: She is feeling a little pressure. Having a small epidural window midline.  Objective: BP 140/94  Pulse 99  Temp 99 F (37.2 C) (Oral)  Resp 20  Ht 5\' 3"  (1.6 m)  Wt 99.791 kg (220 lb)  BMI 38.97 kg/m2  SpO2 98%  LMP 06/14/2011 I/O last 3 completed shifts: In: 905 [P.O.:240; I.V.:415; IV Piggyback:250] Out: 400 [Urine:400] Total I/O In: 1787.1 [P.O.:580; I.V.:1007.1; IV Piggyback:200] Out: 325 [Urine:325]  FHT:  FHR: 125 bpm, variability: moderate,  accelerations:  Present,  decelerations:  Absent UC:   Every 1-2 minutes SVE:   9/100/0 Labs: Lab Results  Component Value Date   WBC 15.1* 03/27/2012   HGB 13.5 03/27/2012   HCT 39.1 03/27/2012   MCV 86.7 03/27/2012   PLT 176 03/27/2012    Assessment / Plan: 26 y.o. G1P0000 at [redacted]w[redacted]d admitted for IOL due to pre-eclampsia AROM at 0905, clear fluid Labor: start pitocin Preeclampsia:  on magnesium sulfate and no signs or symptoms of toxicity Fetal Wellbeing:  Category I Pain Control:  Epidural I/D:  GBS+, PCN Anticipated MOD:  NSVD  Kuneff, Renee 03/28/2012, 2:14 PM

## 2012-03-28 NOTE — Anesthesia Procedure Notes (Signed)
Epidural Patient location during procedure: OB Start time: 03/28/2012 3:53 AM  Staffing Performed by: anesthesiologist   Preanesthetic Checklist Completed: patient identified, site marked, surgical consent, pre-op evaluation, timeout performed, IV checked, risks and benefits discussed and monitors and equipment checked  Epidural Patient position: sitting Prep: site prepped and draped and DuraPrep Patient monitoring: continuous pulse ox and blood pressure Approach: midline Injection technique: LOR air  Needle:  Needle type: Tuohy  Needle gauge: 17 G Needle length: 9 cm and 9 Needle insertion depth: 7 cm Catheter type: closed end flexible Catheter size: 19 Gauge Catheter at skin depth: 12 cm Test dose: negative  Assessment Events: blood not aspirated, injection not painful, no injection resistance, negative IV test and no paresthesia  Additional Notes Discussed risk of headache, infection, bleeding, nerve injury and failed or incomplete block.  Patient voices understanding and wishes to proceed. Reason for block:procedure for pain

## 2012-03-28 NOTE — Anesthesia Preprocedure Evaluation (Signed)
Anesthesia Evaluation  Patient identified by MRN, date of birth, ID band Patient awake    Reviewed: Allergy & Precautions, H&P , NPO status , Patient's Chart, lab work & pertinent test results, reviewed documented beta blocker date and time   History of Anesthesia Complications Negative for: history of anesthetic complications  Airway Mallampati: III TM Distance: >3 FB Neck ROM: full    Dental  (+) Teeth Intact   Pulmonary neg pulmonary ROS,  breath sounds clear to auscultation        Cardiovascular hypertension (severe preeclampsia on magnesium), Rhythm:regular Rate:Normal     Neuro/Psych negative neurological ROS  negative psych ROS   GI/Hepatic negative GI ROS, Neg liver ROS,   Endo/Other  Morbid obesity  Renal/GU negative Renal ROS     Musculoskeletal   Abdominal   Peds  Hematology negative hematology ROS (+)   Anesthesia Other Findings   Reproductive/Obstetrics (+) Pregnancy                           Anesthesia Physical Anesthesia Plan  ASA: III  Anesthesia Plan: Epidural   Post-op Pain Management:    Induction:   Airway Management Planned:   Additional Equipment:   Intra-op Plan:   Post-operative Plan:   Informed Consent: I have reviewed the patients History and Physical, chart, labs and discussed the procedure including the risks, benefits and alternatives for the proposed anesthesia with the patient or authorized representative who has indicated his/her understanding and acceptance.     Plan Discussed with:   Anesthesia Plan Comments:         Anesthesia Quick Evaluation

## 2012-03-28 NOTE — Progress Notes (Signed)
I have reviewed records and fetal heart tracings. Agree with above. Napoleon Form, MD

## 2012-03-28 NOTE — H&P (Signed)
Bridget Huffman is a 26 y.o. female presenting for contractions.   Seen in MAU this morning, contractions 15 minutes apart, now 5 minutes apart. No bleeding, loss of fluid. Baby moving well.   Noted to have BP 140s/90s in MAU. Had one high BP during prior MAU admission. Denies headache, vision changes, RUQ pain.  Prenatal care at [facility] Department. Checked last week, not dilated. This morning dilated to 1, thick, very posterior. No complications with this pregnancy.   Maternal Medical History:  Reason for admission: Reason for admission: contractions.  Reason for Admission:   nauseaContractions: Onset was 13-24 hours ago.   Frequency: regular.   Perceived severity is strong.    Fetal activity: Perceived fetal activity is normal.   Last perceived fetal movement was within the past hour.    Prenatal complications: no prenatal complications Prenatal Complications - Diabetes: none.    OB History    Grav Para Term Preterm Abortions TAB SAB Ect Mult Living   1 0 0 0 0 0 0 0 0 0      Past Medical History  Diagnosis Date  . No pertinent past medical history    Past Surgical History  Procedure Date  . No past surgeries    Family History: family history is negative for Alcohol abuse, and Arthritis, and Asthma, and Birth defects, and Cancer, and COPD, and Depression, and Diabetes, and Drug abuse, and Early death, and Hearing loss, and Heart disease, and Hyperlipidemia, and Hypertension, and Learning disabilities, and Kidney disease, and Mental illness, and Mental retardation, and Miscarriages / Stillbirths, and Stroke, and Vision loss, . Social History:  reports that she has never smoked. She does not have any smokeless tobacco history on file. She reports that she does not drink alcohol or use illicit drugs.   Prenatal Transfer Tool  Maternal Diabetes: No Genetic Screening: Declined Too late Maternal Ultrasounds/Referrals: Normal Fetal Ultrasounds or other Referrals:   None Maternal Substance Abuse:  No Significant Maternal Medications:  None Significant Maternal Lab Results:  Lab values include: Group B Strep positive, Rh negative Other Comments:  None  Review of Systems  Constitutional: Negative for fever and chills.  Eyes: Negative for blurred vision and double vision.  Respiratory: Negative for cough and shortness of breath.   Cardiovascular: Negative for chest pain.  Gastrointestinal: Positive for abdominal pain. Negative for nausea, vomiting, diarrhea and constipation.  Genitourinary: Negative for dysuria.  Musculoskeletal: Negative for back pain.  Neurological: Negative for dizziness and headaches.    Dilation: 3 Effacement (%): 20 Station: -2 Exam by:: D Nelson RN Blood pressure 143/75, pulse 101, temperature 98.3 F (36.8 C), temperature source Oral, resp. rate 18, height 5\' 3"  (1.6 m), weight 99.791 kg (220 lb), last menstrual period 06/14/2011, SpO2 98.00%. Maternal Exam:  Uterine Assessment: Contraction strength is moderate.  Contraction frequency is regular.   Abdomen: Fundal height is Appropriate for gestational age.   Fetal presentation: vertex  Pelvis: adequate for delivery.   Cervix: Cervix evaluated by digital exam.     Fetal Exam Fetal Monitor Review: Mode: ultrasound.   Baseline rate: 125.  Variability: moderate (6-25 bpm).   Pattern: accelerations present and no decelerations.    Fetal State Assessment: Category I - tracings are normal.     Physical Exam  Constitutional: She is oriented to person, place, and time. She appears well-developed and well-nourished. No distress.  HENT:  Head: Normocephalic and atraumatic.  Eyes: Conjunctivae normal and EOM are normal.  Neck: Normal range of motion.  Neck supple.  Cardiovascular: Normal rate, regular rhythm and normal heart sounds.   Respiratory: Effort normal and breath sounds normal. No respiratory distress.  GI: Soft. There is no tenderness. There is no rebound  and no guarding.  Musculoskeletal: Normal range of motion. She exhibits no edema and no tenderness.  Neurological: She is alert and oriented to person, place, and time.  Skin: Skin is warm and dry.  Psychiatric: She has a normal mood and affect.   Cervix:  1.5/80/-3  Results for orders placed during the hospital encounter of 03/27/12 (from the past 24 hour(s))  URINALYSIS, ROUTINE W REFLEX MICROSCOPIC     Status: Abnormal   Collection Time   03/27/12 10:31 PM      Component Value Range   Color, Urine YELLOW  YELLOW   APPearance CLEAR  CLEAR   Specific Gravity, Urine 1.015  1.005 - 1.030   pH 6.0  5.0 - 8.0   Glucose, UA NEGATIVE  NEGATIVE mg/dL   Hgb urine dipstick TRACE (*) NEGATIVE   Bilirubin Urine NEGATIVE  NEGATIVE   Ketones, ur NEGATIVE  NEGATIVE mg/dL   Protein, ur 454 (*) NEGATIVE mg/dL   Urobilinogen, UA 0.2  0.0 - 1.0 mg/dL   Nitrite NEGATIVE  NEGATIVE   Leukocytes, UA NEGATIVE  NEGATIVE  URINE MICROSCOPIC-ADD ON     Status: Abnormal   Collection Time   03/27/12 10:31 PM      Component Value Range   Squamous Epithelial / LPF FEW (*) RARE   WBC, UA 0-2  <3 WBC/hpf   RBC / HPF 0-2  <3 RBC/hpf   Bacteria, UA FEW (*) RARE  PROTEIN / CREATININE RATIO, URINE     Status: Abnormal   Collection Time   03/27/12 10:45 PM      Component Value Range   Creatinine, Urine 45.35     Total Protein, Urine 86.9     PROTEIN CREATININE RATIO 1.92 (*) 0.00 - 0.15  COMPREHENSIVE METABOLIC PANEL     Status: Abnormal   Collection Time   03/27/12 11:35 PM      Component Value Range   Sodium 139  135 - 145 mEq/L   Potassium 3.8  3.5 - 5.1 mEq/L   Chloride 103  96 - 112 mEq/L   CO2 22  19 - 32 mEq/L   Glucose, Bld 85  70 - 99 mg/dL   BUN 8  6 - 23 mg/dL   Creatinine, Ser 0.98  0.50 - 1.10 mg/dL   Calcium 9.1  8.4 - 11.9 mg/dL   Total Protein 6.1  6.0 - 8.3 g/dL   Albumin 2.9 (*) 3.5 - 5.2 g/dL   AST 17  0 - 37 U/L   ALT 14  0 - 35 U/L   Alkaline Phosphatase 340 (*) 39 - 117 U/L    Total Bilirubin 0.2 (*) 0.3 - 1.2 mg/dL   GFR calc non Af Amer >90  >90 mL/min   GFR calc Af Amer >90  >90 mL/min  CBC     Status: Abnormal   Collection Time   03/27/12 11:35 PM      Component Value Range   WBC 15.1 (*) 4.0 - 10.5 K/uL   RBC 4.51  3.87 - 5.11 MIL/uL   Hemoglobin 13.5  12.0 - 15.0 g/dL   HCT 14.7  82.9 - 56.2 %   MCV 86.7  78.0 - 100.0 fL   MCH 29.9  26.0 - 34.0 pg   MCHC 34.5  30.0 - 36.0 g/dL   RDW 16.1  09.6 - 04.5 %   Platelets 176  150 - 400 K/uL    Prenatal labs: ABO, Rh: AB/Negative/-- (03/11 0000) Antibody: Positive (03/11 0000) Rubella: Nonimmune (03/11 0000) RPR: Nonreactive (03/11 0000)  HBsAg: Negative (03/11 0000)  HIV: Non-reactive (03/11 0000)  GBS: Positive (03/01 0000)   Assessment/Plan: 26 y.o. G1P0000 at [redacted]w[redacted]d with 1. Early labor. Changed from 1/thick/high in AM 10/17 to 3/80/-2, regular contractions. 2.  Mild Preeclampsia:  BP in 140s/90s in AM and again in PM. Proteinuria.  Admit for labor/labor augmentation. Start magnesium. Monitor BP. Labetalol IV for >160/110. 3.  GBS positive:  PCN 4.  RhNeg - received Rhogam at 28 weeks. Rh work-up after delivery and treat with Rhogam if needed.   Napoleon Form 03/28/2012, 5:05 AM

## 2012-03-29 DIAGNOSIS — O1404 Mild to moderate pre-eclampsia, complicating childbirth: Secondary | ICD-10-CM

## 2012-03-29 LAB — CBC
HCT: 31.4 % — ABNORMAL LOW (ref 36.0–46.0)
MCH: 30.1 pg (ref 26.0–34.0)
MCV: 87.5 fL (ref 78.0–100.0)
Platelets: 168 10*3/uL (ref 150–400)
RBC: 3.59 MIL/uL — ABNORMAL LOW (ref 3.87–5.11)

## 2012-03-29 MED ORDER — LACTATED RINGERS IV SOLN
INTRAVENOUS | Status: DC
  Administered 2012-03-29: 15:00:00 via INTRAVENOUS

## 2012-03-29 NOTE — Progress Notes (Signed)
1600- pt c/o dizziness when assist5ed to stand at the side of the bed. Unable to walk to the bathroom with assistance. Assisted to the bedside commode.

## 2012-03-29 NOTE — Progress Notes (Signed)
1500- pt assited to sit up on the side of the bed. C/o dizziness. Instructed to call rn for assistance when getting out of bed.

## 2012-03-29 NOTE — Progress Notes (Signed)
Post Partum Day 1 Subjective: no complaints, tolerating PO and Foley catheter in place this am  Objective: Blood pressure 103/58, pulse 99, temperature 99.1 F (37.3 C), temperature source Oral, resp. rate 18, height 5\' 3"  (1.6 m), weight 99.791 kg (220 lb), last menstrual period 06/14/2011, SpO2 98.00%, unknown if currently breastfeeding.  Physical Exam:  General: alert, cooperative and no distress Lochia: appropriate, small amount Uterine Fundus: firm, -1 Incision: N/A DVT Evaluation: No evidence of DVT seen on physical exam. Negative Homan's sign. No cords or calf tenderness. No significant calf/ankle edema.   Basename 03/29/12 0548 03/28/12 2230  HGB 10.8* 12.0  HCT 31.4* 35.5*    Assessment/Plan: Plan for discharge tomorrow and Breastfeeding Foley catheter to be removed this am   LOS: 2 days   LEFTWICH-KIRBY, LISA 03/29/2012, 7:25 AM

## 2012-03-30 LAB — TYPE AND SCREEN
ABO/RH(D): AB NEG
Antibody Screen: NEGATIVE
Unit division: 0
Unit division: 0
Unit division: 0
Unit division: 0

## 2012-03-30 MED ORDER — IBUPROFEN 600 MG PO TABS: 600.0000 mg | ORAL_TABLET | Freq: Four times a day (QID) | ORAL | Status: DC | PRN

## 2012-03-30 MED ORDER — MEASLES, MUMPS & RUBELLA VAC ~~LOC~~ INJ
0.5000 mL | INJECTION | Freq: Once | SUBCUTANEOUS | Status: AC
  Administered 2012-03-30: 0.5 mL via SUBCUTANEOUS
  Filled 2012-03-30: qty 0.5

## 2012-03-30 MED ORDER — RHO D IMMUNE GLOBULIN 1500 UNIT/2ML IJ SOLN
300.0000 ug | Freq: Once | INTRAMUSCULAR | Status: AC
  Administered 2012-03-30: 300 ug via INTRAMUSCULAR
  Filled 2012-03-30: qty 2

## 2012-03-30 NOTE — Discharge Summary (Signed)
Obstetric Discharge Summary Reason for Admission: onset of labor Prenatal Procedures: none Intrapartum Procedures: spontaneous vaginal delivery and GBS prophylaxis Postpartum Procedures: 24hrs magnesium postpartum Complications-Operative and Postpartum: 1st degree perineal laceration Hemoglobin  Date Value Range Status  03/29/2012 10.8* 12.0 - 15.0 g/dL Final     HCT  Date Value Range Status  03/29/2012 31.4* 36.0 - 46.0 % Final   Temp:  [97.7 F (36.5 C)-98.9 F (37.2 C)] 97.7 F (36.5 C) (10/20 0520) Pulse Rate:  [78-109] 78  (10/20 0520) Resp:  [18-20] 18  (10/20 0520) BP: (113-133)/(59-89) 133/77 mmHg (10/20 0520) SpO2:  [98 %-99 %] 99 % (10/20 0520)  Denies ha, scotomata, ruq/epigastric pain, vomiting.  Slightly nauseated this am, drinking ginger ale.  Physical Exam:  General: alert, cooperative and no distress Lochia: appropriate Uterine Fundus: firm Incision: n/a DVT Evaluation: No evidence of DVT seen on physical exam. Negative Homan's sign. No cords or calf tenderness. No significant calf/ankle edema.  Discharge Diagnoses: Term Pregnancy-delivered and Preelampsia  Discharge Information: Date: 03/30/2012 Activity: pelvic rest Diet: routine Medications: PNV and Ibuprofen Condition: stable Instructions: refer to practice specific booklet Discharge to: home Follow-up Information    Follow up with [facility] [place]. Schedule an appointment as soon as possible for a visit in 1 week. (to check blood pressure.  Then 4-6 weeks for postpartum visit. Return to hospital for headache not relieved by tylenol, vision changes, pain under your right breast, nausea and vomiting, or other concerns. )    Contact information:   [address] [place] [address] [phone]         Newborn Data: Live born female  Birth Weight: 7 lb 1.6 oz (3221 g) APGAR: 8, 9  Home with mother.  Breastfeeding, condoms for contraception, OP circumcision.  Marge Duncans 03/30/2012, 8:33 AM

## 2012-03-30 NOTE — Progress Notes (Addendum)
Post Partum Day 2 Subjective: up ad lib, voiding and tolerating PO, a little nauseated this am. Denies ha, scotomata, ruq/epigastric pain, or vomiting.   Objective: Blood pressure 133/77, pulse 78, temperature 97.7 F (36.5 C), temperature source Oral, resp. rate 18, height 5\' 3"  (1.6 m), weight 99.791 kg (220 lb), last menstrual period 06/14/2011, SpO2 99.00%, unknown if currently breastfeeding. BPs last 24hrs 113-120s/60-80s  Physical Exam:  General: alert, cooperative and no distress Lochia: appropriate Uterine Fundus: firm Incision: n/a DVT Evaluation: No evidence of DVT seen on physical exam. Negative Homan's sign. No cords or calf tenderness. No significant calf/ankle edema.   Basename 03/29/12 0548 03/28/12 2230  HGB 10.8* 12.0  HCT 31.4* 35.5*    Assessment/Plan: Breastfeeding, lactation Research scientist (medical). D/C home today. Condoms for contraception. OP circumcision.    LOS: 3 days   Marge Duncans 03/30/2012, 7:25 AM

## 2012-03-30 NOTE — Anesthesia Postprocedure Evaluation (Signed)
  Anesthesia Post-op Note  Patient: Bridget Huffman  Procedure(s) Performed: * No procedures listed *  Patient Location: Mother/Baby  Anesthesia Type: Epidural  Level of Consciousness: awake  Airway and Oxygen Therapy: Patient Spontanous Breathing  Post-op Pain: mild  Post-op Assessment: Patient's Cardiovascular Status Stable and Respiratory Function Stable  Post-op Vital Signs: stable  Complications: No apparent anesthesia complications

## 2012-03-31 LAB — RH IG WORKUP (INCLUDES ABO/RH): Unit division: 0

## 2012-03-31 NOTE — Progress Notes (Signed)
Post discharge chart review completed.  

## 2012-04-02 ENCOUNTER — Ambulatory Visit (HOSPITAL_COMMUNITY): Admission: RE | Admit: 2012-04-02 | Payer: Self-pay | Source: Ambulatory Visit

## 2012-04-02 ENCOUNTER — Ambulatory Visit (HOSPITAL_COMMUNITY)
Admission: RE | Admit: 2012-04-02 | Discharge: 2012-04-02 | Disposition: A | Discharge status: Home / Self Care | Payer: Self-pay | Source: Ambulatory Visit | Attending: Family Medicine | Admitting: Family Medicine

## 2012-04-02 NOTE — Progress Notes (Addendum)
Adult Lactation Consultation Outpatient Visit Note  Patient Name: Bridget Huffman Date of Birth: [birth year]                                      BW 7-4 Gestational Age at Delivery: [redacted]w[redacted]d                  todays weight 6-10.1 Type of Delivery:   Breastfeeding History: Frequency of Breastfeeding: every 2-3 hours  Length of Feeding: 10 mins Voids: 3-4 Stools: 4 dark green and 1 yellow  Supplementing / Method: Pumping:  Type of Pump:Medela    Frequency:none  Volume:    Comments:  Mother was discharged from hospital with sore nipples and 7% weight loss. [facility] was [facility] at [facility] yesterday and infant lost up to 10 %. Md informed mother to return on Friday and states she may need to supplement. Mother very concerned and doesn't want to give infant formula. She states she gave one bottle with one ounce yesterday. Mother has slight positional strips on both nipples,  Mother states she had breast implants and is concerned that she will not have enough milk,. Consultation Evaluation:  Observed mother latching infant with shallow latch in cradle hold. Inst mother in proper latch with good depth. Infant sustained latch for 15 mins. Mother has good milk supply . Breast are firm and full. Hand expressed 8 ml into bottle and infant was fed with #5 french feeding tube.Infant took 8ml of EBM. Also inst mother in use of SNS as needed.  Initial Feeding Assessment: Pre-feed Weight:3008 Post-feed Weight:3030 Amount Transferred:77ml Comments:infant sustained latch for 15 mins.  Additional Feeding Assessment: Pre-feed Weight:3030 Post-feed [id number] Amount Transferred:68ml Comments:latched well for 20 mins.  Additional Feeding Assessment: Pre-feed Weight: Post-feed Weight: Amount Transferred: Comments:  Total Breast milk Transferred this Visit: 50 ml  Total Supplement Given: 8ml EBM with #5 french feeding tube  Additional Interventions: Mother inst  to cue base feed infant and at least every 3hours. Encouraged wake up exercises with infant as well as skin to skin with all feedings for stimulation. Mother was taught breast compression . inst mother to use #5 french feeding tube to supplement infant with at least 15-20 ml of EBM with each feeding. Mother to pump at least 1-2 times daily . Mother very receptive to teaching. Follow up in one week .  Follow-Up  October 30 at 9am    Stevan Born [facility] 04/02/2012, 1:10 PM

## 2012-04-03 ENCOUNTER — Ambulatory Visit (HOSPITAL_COMMUNITY): Payer: Medicaid Other

## 2012-04-09 ENCOUNTER — Ambulatory Visit (HOSPITAL_COMMUNITY)
Admission: RE | Admit: 2012-04-09 | Discharge: 2012-04-09 | Disposition: A | Discharge status: Home / Self Care | Payer: Self-pay | Source: Ambulatory Visit | Attending: Internal Medicine | Admitting: Internal Medicine

## 2012-04-09 NOTE — Progress Notes (Addendum)
Adult Lactation Consultation Outpatient Visit Note  Patient Name: Bridget Huffman Date of Birth: 1986/05/20 Gestational Age at Delivery: [redacted]w[redacted]d Type of Delivery: Vaginal del on 03/28/2012 -baby Boy "Bridget Huffman" -Birth  weight =6-8 oz,                             Consult today is a F/U [facility] consult - Per mom Bridget Huffman feeds well at the breast,but I don't think he is getting enough so I've been supplementing a few times. Per mom I had implants 7 years ago and I had no  Breast changes during my pregnancy.                             Breastfeeding History: Frequency of Breastfeeding:  Length of Feeding:  Voids: >5  Stools: 4-5 ( Yellowish seedy )   Supplementing / Method: DEBP Medela ( Advanced)  Pumping:  Type of Pump:see above    Frequency:after most feeding ( days and evenings ), 20 mins   Volume:  3 oz in am , 1 oz in the evening   Comments: LC recommended to mom to continue pumping ( see below for details )     Consultation Evaluation: Assessing both breast - appear Full bilaterally but tubular in shape . Left nipple small positional strip upper portion of nipple ( scabbed) , Right breast pinky red but no break down , mom denies engorgement or plugged ducts. After mom massage breast and hand express, quick flow of milk noted bilaterally                                                 Last feeding per mom - 12 N- 8 mins , and at 11am 10-15 mins - due to 2 recent feedings baby may not be overly hungry for consult  Initial Feeding Assessment: Left breast - cross cradle position  Pre-feed Weight: 7.5.6 oz 3336g  Post-feed Weight: 7.6.6 oz 3360g  Amount Transferred:24 ml  Comments: Prior to latch - had mom massage breast , hand express, and then latch worked on depth . Infant sustained latch for 15 mins and was in a consistent swallowing pattern , increased with breast compressions.   Additional Feeding Assessment: Right breast  Pre-feed Weight: reweigh after wet diaper- 7.6.0 oz 3344g    Post-feed Weight: 7.6.7 oz 3364g  Amount Transferred:59ml  Comments: Switched to right breast - Infant latched deeper with flanged lips and sustained a consistent latch with multiply swallows. Mom reported comfort .   Additional Feeding Assessment: re- latched  Pre-feed Weight: 7.6.7 oz 3364g  Post-feed Weight:7.6.7 oz 3366g  Amount Transferred:25ml  Comments: Consistent pattern , latch with depth .   Total Breast milk Transferred this Visit: 48 ml  Total Supplement Given: none   Lactation Plan of Care: Working on increasing milk supply                                            1) For mom - naps , rest , plenty flds, esp. H20 , Nutritious measl and snacks  2) Feed every 2-3 hours and when showing feeding cues                                             3) Skln to skin feedings                                             4) Prior to latch Breast massage , hand express, Latch ( work on depth at the breast ), checks babies lip line                                                 For flanged lips , breast compressions during feeding.                                             5) Feed 1st breast 15-20 mins if in a consistent pattern let the baby finish and then offer 2nd breast                                             6) Due to questionable milk supply challenges supplement with expressed with EBM or formula 25-30 ml                                                   If feeding going on longer than 40 mins and Bridget Huffman doesn't seem satisfied at the breast                                             7) Post pump after 4-6 feedings for 10-15 mins and save milk    Follow-Up- 11/6 Wednesday 9am lactation consult at [facility]                    - Per mo 11/18 @ [facility] with dr. Tobey Bride     Lactation Concerns - due to report of no breast changes - extra pumping is indicated and supplemented as needed . In the mean time monitor weight and  F/U .    Kathrin Greathouse 04/09/2012, 1:32 PM

## 2012-04-16 ENCOUNTER — Ambulatory Visit (HOSPITAL_COMMUNITY)
Admission: RE | Admit: 2012-04-16 | Discharge: 2012-04-16 | Disposition: A | Discharge status: Home / Self Care | Payer: Self-pay | Source: Ambulatory Visit | Attending: Family Medicine | Admitting: Family Medicine

## 2012-04-16 NOTE — Progress Notes (Signed)
Adult Lactation Consultation Outpatient Visit Note  Patient Name: Bridget Huffman Date of Birth: Apr 17, 1986 Gestational Age at Delivery: Unknown Type of Delivery: vag  Breastfeeding History: Frequency of Breastfeeding: 14 times in 24 Length of Feeding: 15-20 Voids: 6+ Stools: 5  Supplementing / Method: Bottle gets 2 ounces of formula every other day. Pumping:  Type of Pump:PIS   Frequency: once every other day  Volume:2-3 oz    Comments:  Encouraged mom to empty her breast at least every 4 hours over night. Occasionally her mother feeds the baby while she sleeps.  Explained FIL in layman's terms to her.  Consultation Evaluation:  Initial Feeding Assessment: Pre-feed Weight:3560 Post-feed Weight:3610 Amount Transferred:50 Comments: Great job.  Additional Feeding Assessment: Pre-feed Weight:3610 Post-feed [id number]:[id number] Amount Transferred:34 Comments:  Total Breast milk Transferred this Visit: 84 Total Supplement Given: 0  Additional Interventions:  Helped to fine tune postioning so that pt would have greater comfort and the baby would achieve a deeper latch.  Pt reassured about MS.  Couplet is doing great.  Encouraged BF support group. Follow-Up PRN   Soyla Dryer 04/16/2012, 9:39 AM

## 2013-11-13 ENCOUNTER — Ambulatory Visit: Payer: Medicaid Other | Attending: Internal Medicine

## 2014-02-08 ENCOUNTER — Ambulatory Visit: Payer: Self-pay | Admitting: Internal Medicine

## 2014-02-09 ENCOUNTER — Encounter: Payer: Self-pay | Admitting: Internal Medicine

## 2014-02-09 ENCOUNTER — Other Ambulatory Visit (HOSPITAL_COMMUNITY)
Admission: RE | Admit: 2014-02-09 | Discharge: 2014-02-09 | Disposition: A | Discharge status: Home / Self Care | Payer: Self-pay | Source: Ambulatory Visit | Attending: Internal Medicine | Admitting: Internal Medicine

## 2014-02-09 ENCOUNTER — Ambulatory Visit: Payer: Self-pay | Attending: Internal Medicine | Admitting: Internal Medicine

## 2014-02-09 VITALS — BP 109/78 | HR 78 | Temp 98.1°F | Resp 16 | Ht 65.0 in | Wt 169.0 lb

## 2014-02-09 DIAGNOSIS — Z01419 Encounter for gynecological examination (general) (routine) without abnormal findings: Secondary | ICD-10-CM | POA: Insufficient documentation

## 2014-02-09 DIAGNOSIS — Z0000 Encounter for general adult medical examination without abnormal findings: Secondary | ICD-10-CM

## 2014-02-09 DIAGNOSIS — Z124 Encounter for screening for malignant neoplasm of cervix: Secondary | ICD-10-CM | POA: Insufficient documentation

## 2014-02-09 LAB — COMPLETE METABOLIC PANEL WITH GFR
ALBUMIN: 4.5 g/dL (ref 3.5–5.2)
ALK PHOS: 56 U/L (ref 39–117)
ALT: 13 U/L (ref 0–35)
AST: 15 U/L (ref 0–37)
BUN: 8 mg/dL (ref 6–23)
CO2: 28 mEq/L (ref 19–32)
CREATININE: 0.76 mg/dL (ref 0.50–1.10)
Calcium: 9.1 mg/dL (ref 8.4–10.5)
Chloride: 103 mEq/L (ref 96–112)
GFR, Est African American: >89 mL/min
GLUCOSE: 90 mg/dL (ref 70–99)
POTASSIUM: 4.6 meq/L (ref 3.5–5.3)
Sodium: 137 mEq/L (ref 135–145)
Total Bilirubin: 0.5 mg/dL (ref 0.2–1.2)
Total Protein: 6.6 g/dL (ref 6.0–8.3)

## 2014-02-09 LAB — CBC WITH DIFFERENTIAL/PLATELET
BASOS PCT: 0 % (ref 0–1)
Basophils Absolute: 0 10*3/uL (ref 0.0–0.1)
EOS ABS: 0.6 10*3/uL (ref 0.0–0.7)
EOS PCT: 8 % — AB (ref 0–5)
HEMATOCRIT: 40.2 % (ref 36.0–46.0)
HEMOGLOBIN: 14 g/dL (ref 12.0–15.0)
Lymphocytes Relative: 42 % (ref 12–46)
Lymphs Abs: 3 10*3/uL (ref 0.7–4.0)
MCH: 30.2 pg (ref 26.0–34.0)
MCHC: 34.8 g/dL (ref 30.0–36.0)
MCV: 86.8 fL (ref 78.0–100.0)
MONO ABS: 0.6 10*3/uL (ref 0.1–1.0)
MONOS PCT: 8 % (ref 3–12)
NEUTROS ABS: 3 10*3/uL (ref 1.7–7.7)
Neutrophils Relative %: 42 % — ABNORMAL LOW (ref 43–77)
Platelets: 258 10*3/uL (ref 150–400)
RBC: 4.63 MIL/uL (ref 3.87–5.11)
RDW: 13.4 % (ref 11.5–15.5)
WBC: 7.2 10*3/uL (ref 4.0–10.5)

## 2014-02-09 LAB — LIPID PANEL
CHOL/HDL RATIO: 2.7 ratio
CHOLESTEROL: 104 mg/dL (ref 0–200)
HDL: 39 mg/dL — ABNORMAL LOW (ref >39)
LDL Cholesterol: 50 mg/dL (ref 0–99)
TRIGLYCERIDES: 74 mg/dL (ref <150)
VLDL: 15 mg/dL (ref 0–40)

## 2014-02-09 LAB — TSH: TSH: 2.456 u[IU]/mL (ref 0.350–4.500)

## 2014-02-09 NOTE — Progress Notes (Signed)
Pt comes in to establish care for annual physical with pap smear Refused STD screening this time No medical hx reported Denies vaginal d/c or abdominal pain LMP- 2 weeks ago. Last pap normal C/o irregular menses after having baby x 1 year ago. Denies clots

## 2014-02-09 NOTE — Progress Notes (Signed)
Patient ID: Bridget Huffman, female   DOB: 08-Sep-1985, 28 y.o.   MRN: [id number]   Bridget Huffman, is a 28 y.o. female  [id number]  [id number]  DOB - 04/04/1986  CC:  Chief Complaint  Patient presents with  . Establish Care  . Annual Exam  . Gynecologic Exam       HPI: Bridget Huffman is a 28 y.o. female here today to establish medical care. Patient has no significant past medical history here today for annual physical examination. She is also requesting Pap smear. She is not on any medication. Last Pap smear was normal. Last menstrual period was 2 weeks ago normal. She does not smoke cigarettes, she does not drink alcohol. Last childbirth was one year ago, menstrual period not regular since that delivery. Patient has No headache, No chest pain, No abdominal pain - No Nausea, No new weakness tingling or numbness, No Cough - SOB.  No Known Allergies Past Medical History  Diagnosis Date  . No pertinent past medical history    Current Outpatient Prescriptions on File Prior to Visit  Medication Sig Dispense Refill  . ibuprofen (ADVIL,MOTRIN) 600 MG tablet Take 1 tablet (600 mg total) by mouth every 6 (six) hours as needed for pain.  30 tablet  1  . omega-3 acid ethyl esters (LOVAZA) 1 G capsule Take 1 g by mouth daily.      . Prenatal Vit-Fe Fumarate-FA (PRENATAL MULTIVITAMIN) TABS Take 1 tablet by mouth daily.       No current facility-administered medications on file prior to visit.   Family History  Problem Relation Age of Onset  . Alcohol abuse Neg Hx   . Arthritis Neg Hx   . Asthma Neg Hx   . Birth defects Neg Hx   . Cancer Neg Hx   . COPD Neg Hx   . Depression Neg Hx   . Diabetes Neg Hx   . Drug abuse Neg Hx   . Early death Neg Hx   . Hearing loss Neg Hx   . Heart disease Neg Hx   . Hyperlipidemia Neg Hx   . Learning disabilities Neg Hx   . Kidney disease Neg Hx   . Mental illness Neg Hx   . Mental retardation Neg Hx   . Miscarriages /  Stillbirths Neg Hx   . Stroke Neg Hx   . Vision loss Neg Hx   . Hypertension Mother    History   Social History  . Marital Status: Married    Spouse Name: N/A    Number of Children: N/A  . Years of Education: N/A   Occupational History  . Not on file.   Social History Main Topics  . Smoking status: Never Smoker   . Smokeless tobacco: Not on file  . Alcohol Use: No  . Drug Use: No  . Sexual Activity: Yes   Other Topics Concern  . Not on file   Social History Narrative  . No narrative on file    Review of Systems: Constitutional: Negative for fever, chills, diaphoresis, activity change, appetite change and fatigue. HENT: Negative for ear pain, nosebleeds, congestion, facial swelling, rhinorrhea, neck pain, neck stiffness and ear discharge.  Eyes: Negative for pain, discharge, redness, itching and visual disturbance. Respiratory: Negative for cough, choking, chest tightness, shortness of breath, wheezing and stridor.  Cardiovascular: Negative for chest pain, palpitations and leg swelling. Gastrointestinal: Negative for abdominal distention. Genitourinary: Negative for dysuria, urgency, frequency, hematuria, flank pain, decreased urine volume, difficulty urinating  and dyspareunia.  Musculoskeletal: Negative for back pain, joint swelling, arthralgia and gait problem. Neurological: Negative for dizziness, tremors, seizures, syncope, facial asymmetry, speech difficulty, weakness, light-headedness, numbness and headaches.  Hematological: Negative for adenopathy. Does not bruise/bleed easily. Psychiatric/Behavioral: Negative for hallucinations, behavioral problems, confusion, dysphoric mood, decreased concentration and agitation.    Objective:   Filed Vitals:   02/09/14 1107  BP: 109/78  Pulse: 78  Temp: 98.1 F (36.7 C)  Resp: 16    Physical Exam: Constitutional: Patient appears well-developed and well-nourished. No distress. HENT: Normocephalic, atraumatic, External  right and left ear normal. Oropharynx is clear and moist.  Eyes: Conjunctivae and EOM are normal. PERRLA, no scleral icterus. Neck: Normal ROM. Neck supple. No JVD. No tracheal deviation. No thyromegaly. CVS: RRR, S1/S2 +, no murmurs, no gallops, no carotid bruit.  Pulmonary: Effort and breath sounds normal, no stridor, rhonchi, wheezes, rales.  Abdominal: Soft. BS +, no distension, tenderness, rebound or guarding.  Musculoskeletal: Normal range of motion. No edema and no tenderness.  Lymphadenopathy: No lymphadenopathy noted, cervical, inguinal or axillary Neuro: Alert. Normal reflexes, muscle tone coordination. No cranial nerve deficit. Skin: Skin is warm and dry. No rash noted. Not diaphoretic. No erythema. No pallor. Psychiatric: Normal mood and affect. Behavior, judgment, thought content normal. Pelvic Exam: Cervix normal in appearance, external genitalia normal, no adnexal masses or tenderness, no cervical motion tenderness, rectovaginal septum normal, uterus normal size, shape, and consistency and vagina normal without discharge   Lab Results  Component Value Date   WBC 22.5* 03/29/2012   HGB 10.8* 03/29/2012   HCT 31.4* 03/29/2012   MCV 87.5 03/29/2012   PLT 168 03/29/2012   Lab Results  Component Value Date   CREATININE 0.56 03/27/2012   BUN 8 03/27/2012   NA 139 03/27/2012   K 3.8 03/27/2012   CL 103 03/27/2012   CO2 22 03/27/2012    No results found for this basename: HGBA1C   Lipid Panel  No results found for this basename: chol, trig, hdl, cholhdl, vldl, ldlcalc       Assessment and plan:   1. Annual physical exam  - CBC with Differential - COMPLETE METABOLIC PANEL WITH GFR - POCT glycosylated hemoglobin (Hb A1C) - Lipid panel - TSH - Urinalysis, Complete  2. Pap smear for cervical cancer screening  - Cytology - PAP  Patient was extensively counseled about nutrition and exercise.  Return in about 1 year (around 02/10/2015), or if symptoms worsen or  fail to improve, for Annual Physical.  The patient was given clear instructions to go to ER or return to medical center if symptoms don't improve, worsen or new problems develop. The patient verbalized understanding. The patient was told to call to get lab results if they haven't heard anything in the next week.     This note has been created with Education officer, environmental. Any transcriptional errors are unintentional.    Jeanann Lewandowsky, MD, MHA, FACP, FAAP [facility] [place], [place] [phone]   02/09/2014, 11:45 AM

## 2014-02-09 NOTE — Patient Instructions (Signed)
Exercise to Stay Healthy Exercise helps you become and stay healthy. EXERCISE IDEAS AND TIPS Choose exercises that:  You enjoy.  Fit into your day. You do not need to exercise really hard to be healthy. You can do exercises at a slow or medium level and stay healthy. You can:  Stretch before and after working out.  Try yoga, Pilates, or tai chi.  Lift weights.  Walk fast, swim, jog, run, climb stairs, bicycle, dance, or rollerskate.  Take aerobic classes. Exercises that burn about 150 calories:  Running 1  miles in 15 minutes.  Playing volleyball for 45 to 60 minutes.  Washing and waxing a car for 45 to 60 minutes.  Playing touch football for 45 minutes.  Walking 1  miles in 35 minutes.  Pushing a stroller 1  miles in 30 minutes.  Playing basketball for 30 minutes.  Raking leaves for 30 minutes.  Bicycling 5 miles in 30 minutes.  Walking 2 miles in 30 minutes.  Dancing for 30 minutes.  Shoveling snow for 15 minutes.  Swimming laps for 20 minutes.  Walking up stairs for 15 minutes.  Bicycling 4 miles in 15 minutes.  Gardening for 30 to 45 minutes.  Jumping rope for 15 minutes.  Washing windows or floors for 45 to 60 minutes. Document Released: 06/30/2010 Document Revised: 08/20/2011 Document Reviewed: 06/30/2010 Exit[employer] Patient Information 2015 [employer]. This information is not intended to replace advice given to you by your health care provider. Make sure you discuss any questions you have with your health care provider.  

## 2014-02-10 LAB — URINALYSIS, COMPLETE
Bacteria, UA: NONE SEEN
Bilirubin Urine: NEGATIVE
CASTS: NONE SEEN
Crystals: NONE SEEN
GLUCOSE, UA: NEGATIVE mg/dL
Ketones, ur: NEGATIVE mg/dL
LEUKOCYTES UA: NEGATIVE
Nitrite: NEGATIVE
PH: 6 (ref 5.0–8.0)
Protein, ur: NEGATIVE mg/dL
Specific Gravity, Urine: 1.022 (ref 1.005–1.030)
Urobilinogen, UA: 0.2 mg/dL (ref 0.0–1.0)

## 2014-02-10 LAB — CYTOLOGY - PAP

## 2014-03-10 ENCOUNTER — Ambulatory Visit: Payer: Self-pay

## 2014-04-12 ENCOUNTER — Encounter: Payer: Self-pay | Admitting: Internal Medicine

## 2014-05-30 ENCOUNTER — Emergency Department (HOSPITAL_COMMUNITY)
Admission: EM | Admit: 2014-05-30 | Discharge: 2014-05-30 | Disposition: A | Discharge status: Home / Self Care | Payer: Medicaid Other | Attending: Emergency Medicine | Admitting: Emergency Medicine

## 2014-05-30 ENCOUNTER — Encounter (HOSPITAL_COMMUNITY): Payer: Self-pay | Admitting: Nurse Practitioner

## 2014-05-30 ENCOUNTER — Emergency Department (HOSPITAL_COMMUNITY): Payer: Medicaid Other

## 2014-05-30 DIAGNOSIS — N8329 Other ovarian cysts: Secondary | ICD-10-CM | POA: Diagnosis not present

## 2014-05-30 DIAGNOSIS — N83209 Unspecified ovarian cyst, unspecified side: Secondary | ICD-10-CM

## 2014-05-30 DIAGNOSIS — R102 Pelvic and perineal pain: Secondary | ICD-10-CM

## 2014-05-30 LAB — WET PREP, GENITAL
TRICH WET PREP: NONE SEEN
YEAST WET PREP: NONE SEEN

## 2014-05-30 LAB — BASIC METABOLIC PANEL
ANION GAP: 12 (ref 5–15)
BUN: 11 mg/dL (ref 6–23)
CHLORIDE: 99 meq/L (ref 96–112)
CO2: 24 mEq/L (ref 19–32)
CREATININE: 0.61 mg/dL (ref 0.50–1.10)
Calcium: 9.1 mg/dL (ref 8.4–10.5)
GFR calc Af Amer: >90 mL/min (ref >90)
GFR calc non Af Amer: >90 mL/min (ref >90)
Glucose, Bld: 111 mg/dL — ABNORMAL HIGH (ref 70–99)
Potassium: 3.8 mEq/L (ref 3.7–5.3)
Sodium: 135 mEq/L — ABNORMAL LOW (ref 137–147)

## 2014-05-30 LAB — CBC WITH DIFFERENTIAL/PLATELET
BASOS ABS: 0 10*3/uL (ref 0.0–0.1)
Basophils Relative: 0 % (ref 0–1)
Eosinophils Absolute: 0.1 10*3/uL (ref 0.0–0.7)
Eosinophils Relative: 0 % (ref 0–5)
HCT: 40.4 % (ref 36.0–46.0)
HEMOGLOBIN: 13.8 g/dL (ref 12.0–15.0)
LYMPHS PCT: 11 % — AB (ref 12–46)
Lymphs Abs: 1.8 10*3/uL (ref 0.7–4.0)
MCH: 29.6 pg (ref 26.0–34.0)
MCHC: 34.2 g/dL (ref 30.0–36.0)
MCV: 86.7 fL (ref 78.0–100.0)
Monocytes Absolute: 0.6 10*3/uL (ref 0.1–1.0)
Monocytes Relative: 4 % (ref 3–12)
NEUTROS ABS: 13.4 10*3/uL — AB (ref 1.7–7.7)
Neutrophils Relative %: 85 % — ABNORMAL HIGH (ref 43–77)
Platelets: 230 10*3/uL (ref 150–400)
RBC: 4.66 MIL/uL (ref 3.87–5.11)
RDW: 12.4 % (ref 11.5–15.5)
WBC: 15.9 10*3/uL — ABNORMAL HIGH (ref 4.0–10.5)

## 2014-05-30 LAB — URINALYSIS, ROUTINE W REFLEX MICROSCOPIC
BILIRUBIN URINE: NEGATIVE
GLUCOSE, UA: NEGATIVE mg/dL
Hgb urine dipstick: NEGATIVE
Leukocytes, UA: NEGATIVE
Nitrite: NEGATIVE
PH: 5.5 (ref 5.0–8.0)
Protein, ur: NEGATIVE mg/dL
Specific Gravity, Urine: 1.028 (ref 1.005–1.030)
Urobilinogen, UA: 0.2 mg/dL (ref 0.0–1.0)

## 2014-05-30 LAB — POC URINE PREG, ED: Preg Test, Ur: NEGATIVE

## 2014-05-30 MED ORDER — ONDANSETRON HCL 4 MG/2ML IJ SOLN
4.0000 mg | Freq: Once | INTRAMUSCULAR | Status: AC
  Administered 2014-05-30: 4 mg via INTRAVENOUS
  Filled 2014-05-30: qty 2

## 2014-05-30 MED ORDER — ONDANSETRON 4 MG PO TBDP: 4.0000 mg | ORAL_TABLET | Freq: Three times a day (TID) | ORAL | Status: DC | PRN

## 2014-05-30 MED ORDER — KETOROLAC TROMETHAMINE 30 MG/ML IJ SOLN
30.0000 mg | Freq: Once | INTRAMUSCULAR | Status: AC
  Administered 2014-05-30: 30 mg via INTRAVENOUS
  Filled 2014-05-30: qty 1

## 2014-05-30 MED ORDER — HYDROCODONE-ACETAMINOPHEN 5-325 MG PO TABS: 1.0000 | ORAL_TABLET | ORAL | Status: DC | PRN

## 2014-05-30 MED ORDER — MORPHINE SULFATE 4 MG/ML IJ SOLN
4.0000 mg | INTRAMUSCULAR | Status: DC | PRN
  Administered 2014-05-30 (×2): 4 mg via INTRAVENOUS
  Filled 2014-05-30 (×2): qty 1

## 2014-05-30 MED ORDER — ONDANSETRON 4 MG PO TBDP
8.0000 mg | ORAL_TABLET | Freq: Once | ORAL | Status: AC
  Administered 2014-05-30: 8 mg via ORAL
  Filled 2014-05-30: qty 2

## 2014-05-30 MED ORDER — NAPROXEN 500 MG PO TABS: 500.0000 mg | ORAL_TABLET | Freq: Two times a day (BID) | ORAL | Status: DC

## 2014-05-30 MED ORDER — SODIUM CHLORIDE 0.9 % IV SOLN
Freq: Once | INTRAVENOUS | Status: AC
  Administered 2014-05-30: 13:00:00 via INTRAVENOUS

## 2014-05-30 NOTE — ED Notes (Signed)
Patient transported to Ultrasound 

## 2014-05-30 NOTE — ED Provider Notes (Addendum)
CSN: [id number]     Arrival date & time 05/30/14  1148 History   First MD Initiated Contact with Patient 05/30/14 1221     Chief Complaint  Patient presents with  . Pelvic Pain      HPI  Patient presents for evaluation of pelvic pain. She wakened this morning and upon turning over in bed had a sudden onset of midline lower abdominal pelvic pain. No vaginal bleeding or discharge. Last period 2 months ago. Not unusual for her. States she typically has a period about every 2-3 months. No pain with ambulation or cough. Pain is not changed in location since onset.  No recent discharge. No history of STDs. Denies any new partners or change in sexual habits. No diarrhea. Some mild nausea vomiting after her pain started.  Past Medical History  Diagnosis Date  . No pertinent past medical history    Past Surgical History  Procedure Laterality Date  . No past surgeries     Family History  Problem Relation Age of Onset  . Alcohol abuse Neg Hx   . Arthritis Neg Hx   . Asthma Neg Hx   . Birth defects Neg Hx   . Cancer Neg Hx   . COPD Neg Hx   . Depression Neg Hx   . Diabetes Neg Hx   . Drug abuse Neg Hx   . Early death Neg Hx   . Hearing loss Neg Hx   . Heart disease Neg Hx   . Hyperlipidemia Neg Hx   . Learning disabilities Neg Hx   . Kidney disease Neg Hx   . Mental illness Neg Hx   . Mental retardation Neg Hx   . Miscarriages / Stillbirths Neg Hx   . Stroke Neg Hx   . Vision loss Neg Hx   . Hypertension Mother    History  Substance Use Topics  . Smoking status: Never Smoker   . Smokeless tobacco: Not on file  . Alcohol Use: No   OB History    Gravida Para Term Preterm AB TAB SAB Ectopic Multiple Living   1 1 1  0 0 0 0 0 0 1     Review of Systems  Constitutional: Negative for fever, chills, diaphoresis, appetite change and fatigue.  HENT: Negative for mouth sores, sore throat and trouble swallowing.   Eyes: Negative for visual disturbance.  Respiratory: Negative for  cough, chest tightness, shortness of breath and wheezing.   Cardiovascular: Negative for chest pain.  Gastrointestinal: Positive for nausea and vomiting. Negative for abdominal pain, diarrhea and abdominal distention.  Endocrine: Negative for polydipsia, polyphagia and polyuria.  Genitourinary: Positive for pelvic pain. Negative for dysuria, frequency and hematuria.  Musculoskeletal: Negative for gait problem.  Skin: Negative for color change, pallor and rash.  Neurological: Negative for dizziness, syncope, light-headedness and headaches.  Hematological: Does not bruise/bleed easily.  Psychiatric/Behavioral: Negative for behavioral problems and confusion.      Allergies  Review of patient's allergies indicates no known allergies.  Home Medications   Prior to Admission medications   Medication Sig Start Date End Date Taking? Authorizing Provider  HYDROcodone-acetaminophen (NORCO/VICODIN) 5-325 MG per tablet Take 1 tablet by mouth every 4 (four) hours as needed. 05/30/14   Rolland PorterMark Keylin Podolsky, MD  ibuprofen (ADVIL,MOTRIN) 600 MG tablet Take 1 tablet (600 mg total) by mouth every 6 (six) hours as needed for pain. 03/30/12   Marge DuncansKimberly Randall Booker, CNM  naproxen (NAPROSYN) 500 MG tablet Take 1 tablet (500 mg total)  by mouth 2 (two) times daily. 05/30/14   Rolland PorterMark Allecia Bells, MD  ondansetron (ZOFRAN ODT) 4 MG disintegrating tablet Take 1 tablet (4 mg total) by mouth every 8 (eight) hours as needed for nausea. 05/30/14   Rolland PorterMark Jakie Debow, MD   BP 119/84 mmHg  Pulse 74  Temp(Src) 98 F (36.7 C) (Oral)  Resp 20  Ht 5\' 3"  (1.6 m)  Wt 165 lb (74.844 kg)  BMI 29.24 kg/m2  SpO2 100% Physical Exam  Constitutional: She is oriented to person, place, and time. She appears well-developed and well-nourished. No distress.  HENT:  Head: Normocephalic.  Eyes: Conjunctivae are normal. Pupils are equal, round, and reactive to light. No scleral icterus.  Neck: Normal range of motion. Neck supple. No thyromegaly present.    Cardiovascular: Normal rate and regular rhythm.  Exam reveals no gallop and no friction rub.   No murmur heard. Pulmonary/Chest: Effort normal and breath sounds normal. No respiratory distress. She has no wheezes. She has no rales.  Abdominal: Soft. Bowel sounds are normal. She exhibits no distension. There is no tenderness. There is no rebound.    Genitourinary:    Musculoskeletal: Normal range of motion.  Neurological: She is alert and oriented to person, place, and time.  Skin: Skin is warm and dry. No rash noted.  Psychiatric: She has a normal mood and affect. Her behavior is normal.    ED Course  Procedures (including critical care time) Labs Review Labs Reviewed  WET PREP, GENITAL - Abnormal; Notable for the following:    Clue Cells Wet Prep HPF POC FEW (*)    WBC, Wet Prep HPF POC FEW (*)    All other components within normal limits  URINALYSIS, ROUTINE W REFLEX MICROSCOPIC - Abnormal; Notable for the following:    APPearance CLOUDY (*)    Ketones, ur >80 (*)    All other components within normal limits  CBC WITH DIFFERENTIAL - Abnormal; Notable for the following:    WBC 15.9 (*)    Neutrophils Relative % 85 (*)    Neutro Abs 13.4 (*)    Lymphocytes Relative 11 (*)    All other components within normal limits  BASIC METABOLIC PANEL - Abnormal; Notable for the following:    Sodium 135 (*)    Glucose, Bld 111 (*)    All other components within normal limits  GC/CHLAMYDIA PROBE AMP  POC URINE PREG, ED    Imaging Review [place] Transvaginal Non-ob  05/30/2014   CLINICAL DATA:  Pelvic pain for 1 day.  Bilateral.  EXAM: TRANSABDOMINAL AND TRANSVAGINAL ULTRASOUND OF PELVIS  TECHNIQUE: Both transabdominal and transvaginal ultrasound examinations of the pelvis were performed. Transabdominal technique was performed for global imaging of the pelvis including uterus, ovaries, adnexal regions, and pelvic cul-de-sac. It was necessary to proceed with endovaginal exam following the  transabdominal exam to visualize the ovaries.  COMPARISON:  None  FINDINGS: Uterus  Measurements: Normal in size at 8.2 x 3.4 x 5.7 cm. No fibroids or other mass visualized.  Endometrium  Thickness: Normal in thickness for premenopausal female at 8 mm. No focal abnormality visualized.  Right ovary  Measurements: Normal in size at 3.6 x 2.6 x 2.6 cm. Normal small follicles.  Left ovary  Measurements: Normal in size at 3.7 x 1.8 x 2.6 cm. Normal small follicles.  Other findings  Small amount free fluid the posterior cul-de-sac  IMPRESSION: 1. Small amount free fluid. 2. Normal uterus and ovaries.   Electronically Signed   By:  Genevive Bi M.D.   On: 05/30/2014 14:43   US Pelvis Complete  05/30/2014   CLINICAL DATA:  Pelvic pain for 1 day.  Bilateral.  EXAM: TRANSABDOMINAL AND TRANSVAGINAL ULTRASOUND OF PELVIS  TECHNIQUE: Both transabdominal and transvaginal ultrasound examinations of the pelvis were performed. Transabdominal technique was performed for global imaging of the pelvis including uterus, ovaries, adnexal regions, and pelvic cul-de-sac. It was necessary to proceed with endovaginal exam following the transabdominal exam to visualize the ovaries.  COMPARISON:  None  FINDINGS: Uterus  Measurements: Normal in size at 8.2 x 3.4 x 5.7 cm. No fibroids or other mass visualized.  Endometrium  Thickness: Normal in thickness for premenopausal female at 8 mm. No focal abnormality visualized.  Right ovary  Measurements: Normal in size at 3.6 x 2.6 x 2.6 cm. Normal small follicles.  Left ovary  Measurements: Normal in size at 3.7 x 1.8 x 2.6 cm. Normal small follicles.  Other findings  Small amount free fluid the posterior cul-de-sac  IMPRESSION: 1. Small amount free fluid. 2. Normal uterus and ovaries.   Electronically Signed   By: Genevive Bi M.D.   On: 05/30/2014 14:43     EKG Interpretation None      MDM   Final diagnoses:  Pelvic pain in female  Cyst of ovary, unspecified laterality     Pelvic exam shows only mild midline tenderness. No marked cervical motion tenderness. No adnexal tenderness. No asymmetry right greater than left. Repeat abdominal exam does not show any localization or concerning right lower quadrant tenderness. With this being sudden onset of symptoms, abnormal menstrual cycle, and pelvic fluid I think this is very likely rupture of ovarian cyst. Discussed this with her at length. I told her that this should really do nothing but continue to improve. If she has any worsening pain or symptoms, develops fever, redeveloped nausea or any other symptoms and asked her to recheck here. Otherwise home, rest, anti-inflammatories, limited pain medication.    Rolland Porter, MD 05/30/14 1609  Rolland Porter, MD 05/31/14 [phone]

## 2014-05-30 NOTE — ED Notes (Signed)
She c/o sudden onset lower abd/pelvic pain this morning with n/v. She denies bowel/bladder changes, denies vaginal discharge or bleeding. She reports her last menstrual period was 2 months ago but her cycles are irregular.

## 2014-05-30 NOTE — ED Notes (Signed)
Patient returned from Ultrasound. 

## 2014-05-30 NOTE — Discharge Instructions (Signed)
When an ovarian cyst ruptures, it can cause sudden pain similar to what you experience this morning. Your ultrasound shows fluid, likely from the ruptured cyst. No current ovarian cyst is noted. Pain from a ruptured ovarian cyst can be sudden and severe, but should do nothing but slowly get better. If you have any worsening of pain. Change in location of pain, fever, recurrence of nausea or vomiting, or any other worsening symptoms and please return for reevaluation.  Abdominal Pain Many things can cause abdominal pain. Usually, abdominal pain is not caused by a disease and will improve without treatment. It can often be observed and treated at home. Your health care provider will do a physical exam and possibly order blood tests and X-rays to help determine the seriousness of your pain. However, in many cases, more time must pass before a clear cause of the pain can be found. Before that point, your health care provider may not know if you need more testing or further treatment. HOME CARE INSTRUCTIONS  Monitor your abdominal pain for any changes. The following actions may help to alleviate any discomfort you are experiencing:  Only take over-the-counter or prescription medicines as directed by your health care provider.  Do not take laxatives unless directed to do so by your health care provider.  Try a clear liquid diet (broth, tea, or water) as directed by your health care provider. Slowly move to a bland diet as tolerated. SEEK MEDICAL CARE IF:  You have unexplained abdominal pain.  You have abdominal pain associated with nausea or diarrhea.  You have pain when you urinate or have a bowel movement.  You experience abdominal pain that wakes you in the night.  You have abdominal pain that is worsened or improved by eating food.  You have abdominal pain that is worsened with eating fatty foods.  You have a fever. SEEK IMMEDIATE MEDICAL CARE IF:   Your pain does not go away within 2  hours.  You keep throwing up (vomiting).  Your pain is felt only in portions of the abdomen, such as the right side or the left lower portion of the abdomen.  You pass bloody or black tarry stools. MAKE SURE YOU:  Understand these instructions.   Will watch your condition.   Will get help right away if you are not doing well or get worse.  Document Released: 03/07/2005 Document Revised: 06/02/2013 Document Reviewed: 02/04/2013 [facility] Patient Information 2015 [place], [place]. This information is not intended to replace advice given to you by your health care provider. Make sure you discuss any questions you have with your health care provider.  Pelvic Pain Female pelvic pain can be caused by many different things and start from a variety of places. Pelvic pain refers to pain that is located in the lower half of the abdomen and between your hips. The pain may occur over a short period of time (acute) or may be reoccurring (chronic). The cause of pelvic pain may be related to disorders affecting the female reproductive organs (gynecologic), but it may also be related to the bladder, kidney stones, an intestinal complication, or muscle or skeletal problems. Getting help right away for pelvic pain is important, especially if there has been severe, sharp, or a sudden onset of unusual pain. It is also important to get help right away because some types of pelvic pain can be life threatening.  CAUSES  Below are only some of the causes of pelvic pain. The causes of pelvic pain  can be in one of several categories.   Gynecologic.  Pelvic inflammatory disease.  Sexually transmitted infection.  Ovarian cyst or a twisted ovarian ligament (ovarian torsion).  Uterine lining that grows outside the uterus (endometriosis).  Fibroids, cysts, or tumors.  Ovulation.  Pregnancy.  Pregnancy that occurs outside the uterus (ectopic pregnancy).  Miscarriage.  Labor.  Abruption of the placenta  or ruptured uterus.  Infection.  Uterine infection (endometritis).  Bladder infection.  Diverticulitis.  Miscarriage related to a uterine infection (septic abortion).  Bladder.  Inflammation of the bladder (cystitis).  Kidney stone(s).  Gastrointestinal.  Constipation.  Diverticulitis.  Neurologic.  Trauma.  Feeling pelvic pain because of mental or emotional causes (psychosomatic).  Cancers of the bowel or pelvis. EVALUATION  Your caregiver will want to take a careful history of your concerns. This includes recent changes in your health, a careful gynecologic history of your periods (menses), and a sexual history. Obtaining your family history and medical history is also important. Your caregiver may suggest a pelvic exam. A pelvic exam will help identify the location and severity of the pain. It also helps in the evaluation of which organ system may be involved. In order to identify the cause of the pelvic pain and be properly treated, your caregiver may order tests. These tests may include:   A pregnancy test.  Pelvic ultrasonography.  An X-ray exam of the abdomen.  A urinalysis or evaluation of vaginal discharge.  Blood tests. HOME CARE INSTRUCTIONS   Only take over-the-counter or prescription medicines for pain, discomfort, or fever as directed by your caregiver.   Rest as directed by your caregiver.   Eat a balanced diet.   Drink enough fluids to make your urine clear or pale yellow, or as directed.   Avoid sexual intercourse if it causes pain.   Apply warm or cold compresses to the lower abdomen depending on which one helps the pain.   Avoid stressful situations.   Keep a journal of your pelvic pain. Write down when it started, where the pain is located, and if there are things that seem to be associated with the pain, such as food or your menstrual cycle.  Follow up with your caregiver as directed.  SEEK MEDICAL CARE IF:  Your medicine  does not help your pain.  You have abnormal vaginal discharge. SEEK IMMEDIATE MEDICAL CARE IF:   You have heavy bleeding from the vagina.   Your pelvic pain increases.   You feel light-headed or faint.   You have chills.   You have pain with urination or blood in your urine.   You have uncontrolled diarrhea or vomiting.   You have a fever or persistent symptoms for more than 3 days.  You have a fever and your symptoms suddenly get worse.   You are being physically or sexually abused.  MAKE SURE YOU:  Understand these instructions.  Will watch your condition.  Will get help if you are not doing well or get worse. Document Released: 04/24/2004 Document Revised: 10/12/2013 Document Reviewed: 09/17/2011 [facility] Patient Information 2015 [place], [place]. This information is not intended to replace advice given to you by your health care provider. Make sure you discuss any questions you have with your health care provider.  Ovarian Cyst An ovarian cyst is a sac filled with fluid or blood. This sac is attached to the ovary. Some cysts go away on their own. Other cysts need treatment.  HOME CARE   Only take medicine as told  by your doctor.  Follow up with your doctor as told.  Get regular pelvic exams and Pap tests. GET HELP IF:  Your periods are late, not regular, or painful.  You stop having periods.  Your belly (abdominal) or pelvic pain does not go away.  Your belly becomes large or puffy (swollen).  You have a hard time peeing (totally emptying your bladder).  You have pressure on your bladder.  You have pain during sex.  You feel fullness, pressure, or discomfort in your belly.  You lose weight for no reason.  You feel sick most of the time.  You have a hard time pooping (constipation).  You do not feel like eating.  You develop pimples (acne).  You have an increase in hair on your body and face.  You are gaining weight for no  reason.  You think you are pregnant. GET HELP RIGHT AWAY IF:   Your belly pain gets worse.  You feel sick to your stomach (nauseous), and you throw up (vomit).  You have a fever that comes on fast.  You have belly pain while pooping (bowel movement).  Your periods are heavier than usual. MAKE SURE YOU:   Understand these instructions.  Will watch your condition.  Will get help right away if you are not doing well or get worse. Document Released: 11/14/2007 Document Revised: 03/18/2013 Document Reviewed: 02/02/2013 [facility] Patient Information 2015 [place], [place]. This information is not intended to replace advice given to you by your health care provider. Make sure you discuss any questions you have with your health care provider.

## 2014-05-31 ENCOUNTER — Inpatient Hospital Stay (HOSPITAL_COMMUNITY)
Admission: EM | Admit: 2014-05-31 | Discharge: 2014-06-03 | DRG: 340 | Disposition: A | Discharge status: Home / Self Care | Payer: Medicaid Other | Attending: General Surgery | Admitting: General Surgery

## 2014-05-31 ENCOUNTER — Encounter (HOSPITAL_COMMUNITY): Admission: EM | Disposition: A | Discharge status: Home / Self Care | Payer: Self-pay | Source: Home / Self Care

## 2014-05-31 ENCOUNTER — Observation Stay (HOSPITAL_COMMUNITY): Payer: Medicaid Other | Admitting: Certified Registered Nurse Anesthetist

## 2014-05-31 ENCOUNTER — Encounter (HOSPITAL_COMMUNITY): Payer: Self-pay | Admitting: Family Medicine

## 2014-05-31 ENCOUNTER — Emergency Department (HOSPITAL_COMMUNITY): Payer: Medicaid Other

## 2014-05-31 DIAGNOSIS — K353 Acute appendicitis with localized peritonitis, without perforation or gangrene: Secondary | ICD-10-CM

## 2014-05-31 DIAGNOSIS — K352 Acute appendicitis with generalized peritonitis: Principal | ICD-10-CM | POA: Diagnosis present

## 2014-05-31 DIAGNOSIS — T1490XA Injury, unspecified, initial encounter: Secondary | ICD-10-CM

## 2014-05-31 DIAGNOSIS — K3532 Acute appendicitis with perforation and localized peritonitis, without abscess: Secondary | ICD-10-CM | POA: Diagnosis present

## 2014-05-31 DIAGNOSIS — K3580 Unspecified acute appendicitis: Secondary | ICD-10-CM | POA: Diagnosis present

## 2014-05-31 DIAGNOSIS — Z8249 Family history of ischemic heart disease and other diseases of the circulatory system: Secondary | ICD-10-CM

## 2014-05-31 HISTORY — PX: LAPAROSCOPIC APPENDECTOMY: SHX408

## 2014-05-31 LAB — URINALYSIS, ROUTINE W REFLEX MICROSCOPIC
Bilirubin Urine: NEGATIVE
Glucose, UA: NEGATIVE mg/dL
Hgb urine dipstick: NEGATIVE
Ketones, ur: 15 mg/dL — AB
LEUKOCYTES UA: NEGATIVE
Nitrite: NEGATIVE
PH: 6.5 (ref 5.0–8.0)
PROTEIN: NEGATIVE mg/dL
Specific Gravity, Urine: <1.005 — ABNORMAL LOW (ref 1.005–1.030)
Urobilinogen, UA: 1 mg/dL (ref 0.0–1.0)

## 2014-05-31 LAB — COMPREHENSIVE METABOLIC PANEL
ALT: 10 U/L (ref 0–35)
AST: 15 U/L (ref 0–37)
Albumin: 4.1 g/dL (ref 3.5–5.2)
Alkaline Phosphatase: 62 U/L (ref 39–117)
Anion gap: 14 (ref 5–15)
BUN: 14 mg/dL (ref 6–23)
CALCIUM: 8.9 mg/dL (ref 8.4–10.5)
CO2: 25 mEq/L (ref 19–32)
Chloride: 97 mEq/L (ref 96–112)
Creatinine, Ser: 0.72 mg/dL (ref 0.50–1.10)
GFR calc non Af Amer: >90 mL/min (ref >90)
Glucose, Bld: 115 mg/dL — ABNORMAL HIGH (ref 70–99)
Potassium: 3.7 mEq/L (ref 3.7–5.3)
Sodium: 136 mEq/L — ABNORMAL LOW (ref 137–147)
TOTAL PROTEIN: 7.4 g/dL (ref 6.0–8.3)
Total Bilirubin: 1.1 mg/dL (ref 0.3–1.2)

## 2014-05-31 LAB — LIPASE, BLOOD: Lipase: 24 U/L (ref 11–59)

## 2014-05-31 LAB — CBC WITH DIFFERENTIAL/PLATELET
BASOS ABS: 0.1 10*3/uL (ref 0.0–0.1)
BASOS PCT: 1 % (ref 0–1)
EOS ABS: 0 10*3/uL (ref 0.0–0.7)
Eosinophils Relative: 0 % (ref 0–5)
HCT: 39 % (ref 36.0–46.0)
Hemoglobin: 13.6 g/dL (ref 12.0–15.0)
Lymphocytes Relative: 12 % (ref 12–46)
Lymphs Abs: 1.4 10*3/uL (ref 0.7–4.0)
MCH: 30.2 pg (ref 26.0–34.0)
MCHC: 34.9 g/dL (ref 30.0–36.0)
MCV: 86.5 fL (ref 78.0–100.0)
Monocytes Absolute: 0.8 10*3/uL (ref 0.1–1.0)
Monocytes Relative: 7 % (ref 3–12)
Neutro Abs: 9.7 10*3/uL — ABNORMAL HIGH (ref 1.7–7.7)
Neutrophils Relative %: 80 % — ABNORMAL HIGH (ref 43–77)
PLATELETS: 203 10*3/uL (ref 150–400)
RBC: 4.51 MIL/uL (ref 3.87–5.11)
RDW: 12.4 % (ref 11.5–15.5)
WBC: 12 10*3/uL — AB (ref 4.0–10.5)

## 2014-05-31 LAB — GC/CHLAMYDIA PROBE AMP
CT PROBE, AMP APTIMA: NEGATIVE
GC Probe RNA: NEGATIVE

## 2014-05-31 SURGERY — APPENDECTOMY, LAPAROSCOPIC
Anesthesia: General | Site: Abdomen

## 2014-05-31 MED ORDER — PROPOFOL 10 MG/ML IV BOLUS
INTRAVENOUS | Status: AC
  Filled 2014-05-31: qty 20

## 2014-05-31 MED ORDER — OXYCODONE HCL 5 MG/5ML PO SOLN: 5.0000 mg | Freq: Once | ORAL | Status: DC | PRN

## 2014-05-31 MED ORDER — HYDROMORPHONE HCL 1 MG/ML IJ SOLN
0.2500 mg | INTRAMUSCULAR | Status: DC | PRN
  Administered 2014-05-31: 0.5 mg via INTRAVENOUS

## 2014-05-31 MED ORDER — OXYCODONE HCL 5 MG PO TABS: 5.0000 mg | ORAL_TABLET | Freq: Once | ORAL | Status: DC | PRN

## 2014-05-31 MED ORDER — DEXAMETHASONE SODIUM PHOSPHATE 4 MG/ML IJ SOLN
INTRAMUSCULAR | Status: AC
  Filled 2014-05-31: qty 2

## 2014-05-31 MED ORDER — SUCCINYLCHOLINE CHLORIDE 20 MG/ML IJ SOLN
INTRAMUSCULAR | Status: DC | PRN
  Administered 2014-05-31: 100 mg via INTRAVENOUS

## 2014-05-31 MED ORDER — LACTATED RINGERS IV SOLN
INTRAVENOUS | Status: DC | PRN
  Administered 2014-05-31 (×2): via INTRAVENOUS

## 2014-05-31 MED ORDER — LIDOCAINE HCL (CARDIAC) 20 MG/ML IV SOLN
INTRAVENOUS | Status: DC | PRN
  Administered 2014-05-31: 80 mg via INTRAVENOUS

## 2014-05-31 MED ORDER — PROPOFOL 10 MG/ML IV BOLUS
INTRAVENOUS | Status: DC | PRN
  Administered 2014-05-31: 150 mg via INTRAVENOUS

## 2014-05-31 MED ORDER — MIDAZOLAM HCL 2 MG/2ML IJ SOLN
INTRAMUSCULAR | Status: AC
  Filled 2014-05-31: qty 2

## 2014-05-31 MED ORDER — IOHEXOL 300 MG/ML  SOLN
100.0000 mL | Freq: Once | INTRAMUSCULAR | Status: AC | PRN
  Administered 2014-05-31: 100 mL via INTRAVENOUS

## 2014-05-31 MED ORDER — ONDANSETRON HCL 4 MG/2ML IJ SOLN
INTRAMUSCULAR | Status: DC | PRN
  Administered 2014-05-31: 4 mg via INTRAVENOUS

## 2014-05-31 MED ORDER — DEXAMETHASONE SODIUM PHOSPHATE 4 MG/ML IJ SOLN
INTRAMUSCULAR | Status: DC | PRN
  Administered 2014-05-31: 4 mg via INTRAVENOUS

## 2014-05-31 MED ORDER — SODIUM CHLORIDE 0.9 % IV SOLN
Freq: Once | INTRAVENOUS | Status: AC
  Administered 2014-05-31: 18:00:00 via INTRAVENOUS

## 2014-05-31 MED ORDER — MIDAZOLAM HCL 5 MG/5ML IJ SOLN
INTRAMUSCULAR | Status: DC | PRN
Start: 2014-05-31 — End: 2014-05-31
  Administered 2014-05-31: 2 mg via INTRAVENOUS

## 2014-05-31 MED ORDER — PROMETHAZINE HCL 25 MG/ML IJ SOLN: 6.2500 mg | INTRAMUSCULAR | Status: DC | PRN

## 2014-05-31 MED ORDER — NEOSTIGMINE METHYLSULFATE 10 MG/10ML IV SOLN
INTRAVENOUS | Status: AC
  Filled 2014-05-31: qty 1

## 2014-05-31 MED ORDER — ROCURONIUM BROMIDE 100 MG/10ML IV SOLN
INTRAVENOUS | Status: DC | PRN
  Administered 2014-05-31: 20 mg via INTRAVENOUS
  Administered 2014-05-31 (×2): 5 mg via INTRAVENOUS

## 2014-05-31 MED ORDER — FENTANYL CITRATE 0.05 MG/ML IJ SOLN
INTRAMUSCULAR | Status: DC | PRN
  Administered 2014-05-31 (×2): 50 ug via INTRAVENOUS

## 2014-05-31 MED ORDER — GLYCOPYRROLATE 0.2 MG/ML IJ SOLN
INTRAMUSCULAR | Status: DC | PRN
  Administered 2014-05-31: .4 mg via INTRAVENOUS

## 2014-05-31 MED ORDER — PHENYLEPHRINE HCL 10 MG/ML IJ SOLN
INTRAMUSCULAR | Status: DC | PRN
Start: 2014-05-31 — End: 2014-05-31
  Administered 2014-05-31 (×2): 80 ug via INTRAVENOUS

## 2014-05-31 MED ORDER — PIPERACILLIN-TAZOBACTAM 3.375 G IVPB
3.3750 g | Freq: Three times a day (TID) | INTRAVENOUS | Status: DC
  Administered 2014-06-01 – 2014-06-03 (×8): 3.375 g via INTRAVENOUS
  Filled 2014-05-31 (×12): qty 50

## 2014-05-31 MED ORDER — HYDROMORPHONE HCL 1 MG/ML IJ SOLN
1.0000 mg | INTRAMUSCULAR | Status: AC | PRN
  Administered 2014-05-31 (×2): 1 mg via INTRAVENOUS
  Filled 2014-05-31 (×2): qty 1

## 2014-05-31 MED ORDER — MORPHINE SULFATE 4 MG/ML IJ SOLN
4.0000 mg | INTRAMUSCULAR | Status: AC | PRN
  Administered 2014-05-31 – 2014-06-01 (×3): 4 mg via INTRAVENOUS
  Filled 2014-05-31 (×3): qty 1

## 2014-05-31 MED ORDER — ONDANSETRON HCL 4 MG/2ML IJ SOLN
INTRAMUSCULAR | Status: AC
  Filled 2014-05-31: qty 2

## 2014-05-31 MED ORDER — DEXAMETHASONE SODIUM PHOSPHATE 10 MG/ML IJ SOLN
INTRAMUSCULAR | Status: AC
  Filled 2014-05-31: qty 1

## 2014-05-31 MED ORDER — HYDROMORPHONE HCL 1 MG/ML IJ SOLN
INTRAMUSCULAR | Status: AC
  Filled 2014-05-31: qty 1

## 2014-05-31 MED ORDER — LIDOCAINE HCL (CARDIAC) 20 MG/ML IV SOLN
INTRAVENOUS | Status: AC
  Filled 2014-05-31: qty 15

## 2014-05-31 MED ORDER — BUPIVACAINE HCL 0.25 % IJ SOLN
INTRAMUSCULAR | Status: DC | PRN
Start: 2014-05-31 — End: 2014-05-31
  Administered 2014-05-31: 7 mL

## 2014-05-31 MED ORDER — DEXTROSE-NACL 5-0.9 % IV SOLN
INTRAVENOUS | Status: DC
  Administered 2014-06-01: 23:00:00 via INTRAVENOUS

## 2014-05-31 MED ORDER — PIPERACILLIN-TAZOBACTAM 3.375 G IVPB
3.3750 g | Freq: Once | INTRAVENOUS | Status: AC
  Administered 2014-05-31: 3.375 g via INTRAVENOUS
  Filled 2014-05-31: qty 50

## 2014-05-31 MED ORDER — FENTANYL CITRATE 0.05 MG/ML IJ SOLN
INTRAMUSCULAR | Status: AC
  Filled 2014-05-31: qty 5

## 2014-05-31 MED ORDER — ROCURONIUM BROMIDE 50 MG/5ML IV SOLN
INTRAVENOUS | Status: AC
  Filled 2014-05-31: qty 1

## 2014-05-31 MED ORDER — PHENYLEPHRINE 40 MCG/ML (10ML) SYRINGE FOR IV PUSH (FOR BLOOD PRESSURE SUPPORT)
PREFILLED_SYRINGE | INTRAVENOUS | Status: AC
  Filled 2014-05-31: qty 20

## 2014-05-31 MED ORDER — BUPIVACAINE-EPINEPHRINE (PF) 0.25% -1:200000 IJ SOLN
INTRAMUSCULAR | Status: AC
  Filled 2014-05-31: qty 30

## 2014-05-31 MED ORDER — GLYCOPYRROLATE 0.2 MG/ML IJ SOLN
INTRAMUSCULAR | Status: AC
  Filled 2014-05-31: qty 2

## 2014-05-31 MED ORDER — ONDANSETRON HCL 4 MG/2ML IJ SOLN
4.0000 mg | Freq: Once | INTRAMUSCULAR | Status: AC
  Administered 2014-05-31: 4 mg via INTRAVENOUS
  Filled 2014-05-31: qty 2

## 2014-05-31 MED ORDER — NEOSTIGMINE METHYLSULFATE 10 MG/10ML IV SOLN
INTRAVENOUS | Status: DC | PRN
  Administered 2014-05-31: 3 mg via INTRAVENOUS

## 2014-05-31 MED ORDER — SODIUM CHLORIDE 0.9 % IR SOLN
Status: DC | PRN
  Administered 2014-05-31: 1

## 2014-05-31 SURGICAL SUPPLY — 42 items
BENZOIN TINCTURE PRP APPL 2/3 (GAUZE/BANDAGES/DRESSINGS) ×3 IMPLANT
CANISTER SUCTION 2500CC (MISCELLANEOUS) ×3 IMPLANT
CHLORAPREP W/TINT 26ML (MISCELLANEOUS) ×3 IMPLANT
COVER SURGICAL LIGHT HANDLE (MISCELLANEOUS) ×3 IMPLANT
COVER TRANSDUCER ULTRASND (DRAPES) ×3 IMPLANT
DEVICE TROCAR PUNCTURE CLOSURE (ENDOMECHANICALS) ×3 IMPLANT
DRAIN CHANNEL 19F RND (DRAIN) ×3 IMPLANT
DRAPE LAPAROSCOPIC ABDOMINAL (DRAPES) ×3 IMPLANT
DRAPE UTILITY XL STRL (DRAPES) ×6 IMPLANT
ELECT REM PT RETURN 9FT ADLT (ELECTROSURGICAL) ×3
ELECTRODE REM PT RTRN 9FT ADLT (ELECTROSURGICAL) ×1 IMPLANT
ENDOLOOP SUT PDS II  0 18 (SUTURE) ×6
ENDOLOOP SUT PDS II 0 18 (SUTURE) ×3 IMPLANT
EVACUATOR SILICONE 100CC (DRAIN) ×3 IMPLANT
GLOVE BIO SURGEON STRL SZ 6.5 (GLOVE) ×2 IMPLANT
GLOVE BIO SURGEON STRL SZ7.5 (GLOVE) ×9 IMPLANT
GLOVE BIO SURGEONS STRL SZ 6.5 (GLOVE) ×1
GOWN STRL REUS W/ TWL LRG LVL3 (GOWN DISPOSABLE) ×2 IMPLANT
GOWN STRL REUS W/ TWL XL LVL3 (GOWN DISPOSABLE) ×1 IMPLANT
GOWN STRL REUS W/TWL LRG LVL3 (GOWN DISPOSABLE) ×4
GOWN STRL REUS W/TWL XL LVL3 (GOWN DISPOSABLE) ×2
KIT BASIN OR (CUSTOM PROCEDURE TRAY) ×3 IMPLANT
KIT ROOM TURNOVER OR (KITS) ×3 IMPLANT
NEEDLE INSUFFLATION 14GA 120MM (NEEDLE) ×3 IMPLANT
NS IRRIG 1000ML POUR BTL (IV SOLUTION) ×3 IMPLANT
PAD ARMBOARD 7.5X6 YLW CONV (MISCELLANEOUS) ×6 IMPLANT
POUCH RETRIEVAL ECOSAC 10 (ENDOMECHANICALS) ×1 IMPLANT
POUCH RETRIEVAL ECOSAC 10MM (ENDOMECHANICALS) ×2
SCISSORS LAP 5X35 DISP (ENDOMECHANICALS) ×3 IMPLANT
SET IRRIG TUBING LAPAROSCOPIC (IRRIGATION / IRRIGATOR) ×3 IMPLANT
SLEEVE ENDOPATH XCEL 5M (ENDOMECHANICALS) ×3 IMPLANT
SPECIMEN JAR SMALL (MISCELLANEOUS) ×3 IMPLANT
SUT ETHILON 2 0 FS 18 (SUTURE) ×3 IMPLANT
SUT MNCRL AB 3-0 PS2 18 (SUTURE) ×3 IMPLANT
SUT VIC AB 1 BRD 54 (SUTURE) IMPLANT
TOWEL OR 17X24 6PK STRL BLUE (TOWEL DISPOSABLE) ×3 IMPLANT
TOWEL OR 17X26 10 PK STRL BLUE (TOWEL DISPOSABLE) ×3 IMPLANT
TRAY FOLEY CATH 16FR SILVER (SET/KITS/TRAYS/PACK) ×3 IMPLANT
TRAY LAPAROSCOPIC (CUSTOM PROCEDURE TRAY) ×3 IMPLANT
TROCAR XCEL NON-BLD 11X100MML (ENDOMECHANICALS) ×3 IMPLANT
TROCAR XCEL NON-BLD 5MMX100MML (ENDOMECHANICALS) ×3 IMPLANT
TUBING INSUFFLATION (TUBING) ×3 IMPLANT

## 2014-05-31 NOTE — ED Notes (Signed)
Pt not in room.

## 2014-05-31 NOTE — ED Provider Notes (Addendum)
CSN: [id number]     Arrival date & time 05/31/14  1648 History   First MD Initiated Contact with Patient 05/31/14 1751     Chief Complaint  Patient presents with  . Pelvic Pain      HPI  Patient presents for reevaluation. Patient seen by myself yesterday after awakening with sudden onset of suprapubic abdominal pain. Had a leukocytosis. Over, did not have surgical abdomen or peritoneal irritation on exam.  Ultrasound showed trace of pelvic fluid. No cyst noted. However, with her sudden onset of symptoms by diagnosis that time was possible ruptured cyst. Did discuss with her returning if her persist or worsen. She states that today her symptoms have been worse. She is nauseated. Her pain has become more right lower quadrant adnexal or right upper quadrant as well and she presents for reevaluation.  Past Medical History  Diagnosis Date  . No pertinent past medical history    Past Surgical History  Procedure Laterality Date  . No past surgeries     Family History  Problem Relation Age of Onset  . Alcohol abuse Neg Hx   . Arthritis Neg Hx   . Asthma Neg Hx   . Birth defects Neg Hx   . Cancer Neg Hx   . COPD Neg Hx   . Depression Neg Hx   . Diabetes Neg Hx   . Drug abuse Neg Hx   . Early death Neg Hx   . Hearing loss Neg Hx   . Heart disease Neg Hx   . Hyperlipidemia Neg Hx   . Learning disabilities Neg Hx   . Kidney disease Neg Hx   . Mental illness Neg Hx   . Mental retardation Neg Hx   . Miscarriages / Stillbirths Neg Hx   . Stroke Neg Hx   . Vision loss Neg Hx   . Hypertension Mother    History  Substance Use Topics  . Smoking status: Never Smoker   . Smokeless tobacco: Not on file  . Alcohol Use: No   OB History    Gravida Para Term Preterm AB TAB SAB Ectopic Multiple Living   1 1 1  0 0 0 0 0 0 1     Review of Systems  Constitutional: Negative for fever, chills, diaphoresis, appetite change and fatigue.  HENT: Negative for mouth sores, sore throat and  trouble swallowing.   Eyes: Negative for visual disturbance.  Respiratory: Negative for cough, chest tightness, shortness of breath and wheezing.   Cardiovascular: Negative for chest pain.  Gastrointestinal: Positive for nausea and abdominal pain. Negative for vomiting, diarrhea and abdominal distention.  Endocrine: Negative for polydipsia, polyphagia and polyuria.  Genitourinary: Positive for pelvic pain. Negative for dysuria, urgency, frequency, hematuria, decreased urine volume, vaginal bleeding and vaginal discharge.  Musculoskeletal: Negative for gait problem.  Skin: Negative for color change, pallor and rash.  Neurological: Negative for dizziness, syncope, light-headedness and headaches.  Hematological: Does not bruise/bleed easily.  Psychiatric/Behavioral: Negative for behavioral problems and confusion.      Allergies  Review of patient's allergies indicates no known allergies.  Home Medications   Prior to Admission medications   Medication Sig Start Date End Date Taking? Authorizing Provider  HYDROcodone-acetaminophen (NORCO/VICODIN) 5-325 MG per tablet Take 1 tablet by mouth every 4 (four) hours as needed. 05/30/14  Yes Rolland PorterMark Jerlisa Diliberto, MD  ibuprofen (ADVIL,MOTRIN) 600 MG tablet Take 1 tablet (600 mg total) by mouth every 6 (six) hours as needed for pain. 03/30/12   Cala BradfordKimberly  Hulan Fray, CNM  naproxen (NAPROSYN) 500 MG tablet Take 1 tablet (500 mg total) by mouth 2 (two) times daily. 05/30/14   Rolland Porter, MD  ondansetron (ZOFRAN ODT) 4 MG disintegrating tablet Take 1 tablet (4 mg total) by mouth every 8 (eight) hours as needed for nausea. 05/30/14   Rolland Porter, MD   BP 111/65 mmHg  Pulse 120  Temp(Src) 98.5 F (36.9 C) (Oral)  Resp 18  SpO2 97%  LMP  Physical Exam  Constitutional: She is oriented to person, place, and time. She appears well-developed and well-nourished. No distress.  HENT:  Head: Normocephalic.  Eyes: Conjunctivae are normal. Pupils are equal, round,  and reactive to light. No scleral icterus.  Neck: Normal range of motion. Neck supple. No thyromegaly present.  Cardiovascular: Normal rate and regular rhythm.  Exam reveals no gallop and no friction rub.   No murmur heard. Pulmonary/Chest: Effort normal and breath sounds normal. No respiratory distress. She has no wheezes. She has no rales.  Abdominal: Soft. Bowel sounds are normal. She exhibits no distension. There is tenderness in the right upper quadrant, right lower quadrant and suprapubic area. There is no rebound.    Musculoskeletal: Normal range of motion.  Neurological: She is alert and oriented to person, place, and time.  Skin: Skin is warm and dry. No rash noted.  Psychiatric: She has a normal mood and affect. Her behavior is normal.    ED Course  Procedures (including critical care time) Labs Review Labs Reviewed  CBC WITH DIFFERENTIAL - Abnormal; Notable for the following:    WBC 12.0 (*)    Neutrophils Relative % 80 (*)    Neutro Abs 9.7 (*)    All other components within normal limits  COMPREHENSIVE METABOLIC PANEL - Abnormal; Notable for the following:    Sodium 136 (*)    Glucose, Bld 115 (*)    All other components within normal limits  LIPASE, BLOOD  URINALYSIS, ROUTINE W REFLEX MICROSCOPIC    Imaging Review US Transvaginal Non-ob  05/30/2014   CLINICAL DATA:  Pelvic pain for 1 day.  Bilateral.  EXAM: TRANSABDOMINAL AND TRANSVAGINAL ULTRASOUND OF PELVIS  TECHNIQUE: Both transabdominal and transvaginal ultrasound examinations of the pelvis were performed. Transabdominal technique was performed for global imaging of the pelvis including uterus, ovaries, adnexal regions, and pelvic cul-de-sac. It was necessary to proceed with endovaginal exam following the transabdominal exam to visualize the ovaries.  COMPARISON:  None  FINDINGS: Uterus  Measurements: Normal in size at 8.2 x 3.4 x 5.7 cm. No fibroids or other mass visualized.  Endometrium  Thickness: Normal in  thickness for premenopausal female at 8 mm. No focal abnormality visualized.  Right ovary  Measurements: Normal in size at 3.6 x 2.6 x 2.6 cm. Normal small follicles.  Left ovary  Measurements: Normal in size at 3.7 x 1.8 x 2.6 cm. Normal small follicles.  Other findings  Small amount free fluid the posterior cul-de-sac  IMPRESSION: 1. Small amount free fluid. 2. Normal uterus and ovaries.   Electronically Signed   By: Genevive Bi M.D.   On: 05/30/2014 14:43   US Pelvis Complete  05/30/2014   CLINICAL DATA:  Pelvic pain for 1 day.  Bilateral.  EXAM: TRANSABDOMINAL AND TRANSVAGINAL ULTRASOUND OF PELVIS  TECHNIQUE: Both transabdominal and transvaginal ultrasound examinations of the pelvis were performed. Transabdominal technique was performed for global imaging of the pelvis including uterus, ovaries, adnexal regions, and pelvic cul-de-sac. It was necessary to proceed with endovaginal  exam following the transabdominal exam to visualize the ovaries.  COMPARISON:  None  FINDINGS: Uterus  Measurements: Normal in size at 8.2 x 3.4 x 5.7 cm. No fibroids or other mass visualized.  Endometrium  Thickness: Normal in thickness for premenopausal female at 8 mm. No focal abnormality visualized.  Right ovary  Measurements: Normal in size at 3.6 x 2.6 x 2.6 cm. Normal small follicles.  Left ovary  Measurements: Normal in size at 3.7 x 1.8 x 2.6 cm. Normal small follicles.  Other findings  Small amount free fluid the posterior cul-de-sac  IMPRESSION: 1. Small amount free fluid. 2. Normal uterus and ovaries.   Electronically Signed   By: Genevive BiStewart  Edmunds M.D.   On: 05/30/2014 14:43     EKG Interpretation None      MDM   Final diagnoses:  Trauma    WBC 12.0. CT scan shows appendicolith with dilated appendix. + Peri-appendiceal fluid consistent with acute appendicitis with probable perforation.  I placed a call to general surgery regarding treatment and admission.  19:35:  Discussed with Dr. Derrell Lollingamirez. He is  en route. I discussed this at length with the patient. We discussed her presentation yesterday and evolution of her symptoms through today. IV Zosyn has been started. I have given her Dilaudid for her pain.    Rolland PorterMark Reona Zendejas, MD 05/31/14 1918  Rolland PorterMark Crestina Strike, MD 05/31/14 [phone]

## 2014-05-31 NOTE — Transfer of Care (Signed)
Immediate Anesthesia Transfer of Care Note  Patient: Bridget Huffman  Procedure(s) Performed: Procedure(s): APPENDECTOMY LAPAROSCOPIC (N/A)  Patient Location: PACU  Anesthesia Type:General  Level of Consciousness: awake, alert  and oriented  Airway & Oxygen Therapy: Patient Spontanous Breathing  Post-op Assessment: Report given to PACU RN and Post -op Vital signs reviewed and stable  Post vital signs: Reviewed and stable  Complications: No apparent anesthesia complications

## 2014-05-31 NOTE — Op Note (Signed)
05/31/2014  10:23 PM  PATIENT:  Bridget Huffman  28 y.o. female  PRE-OPERATIVE DIAGNOSIS:  Acute Appendicitis  POST-OPERATIVE DIAGNOSIS: Perforated Appendicitis  PROCEDURE:  Procedure(s): APPENDECTOMY LAPAROSCOPIC (N/A)  SURGEON:  Surgeon(s) and Role:    * Axel FillerArmando Maudine Kluesner, MD - Primary  ANESTHESIA:   local and general  EBL:  Total I/O In: 700 [I.V.:700] Out: 210 [Urine:200; Blood:10]  BLOOD ADMINISTERED:none  DRAINS: (19Fr) Jackson-Pratt drain(s) with closed bulb suction in the RLQ   LOCAL MEDICATIONS USED:  MARCAINE     SPECIMEN:  Source of Specimen:  appendix  DISPOSITION OF SPECIMEN:  PATHOLOGY  COUNTS:  YES  TOURNIQUET:  * No tourniquets in log *  DICTATION: .Dragon Dictation  Findings:  The patient had a acutely inflamed, perforated appendix  Indications for procedure:  The patient is a 28 year old female with a history of right lower quadrant pain.  Patient had a CT scan which revealed signs consistent with acute appendicitis, perforated.   Details of the procedure:The patient was taken back to the operating room. The patient was placed in supine position with bilateral SCDs in place. The patient was prepped and draped in the usual sterile fashion.  After appropriate anitbiotics were confirmed, a time-out was confirmed and all facts were verified.  A pneumoperitoneum of 14 mmHg was obtained via a Veress needle technique in the left lower quadrant quadrant.  A 5 mm trocar and 5 mm camera then placed intra-abdominally there is no injury to any intra-abdominal organs a 10 mm infraumbilical port was placed and direct visualization as was a 5 mm port in the suprapubic area.  There was a large amount of purulence in the pelvis and periappendiceal.  This was evacuated and the areas were washed with sterile saline.  The appendix was identified  The appendix identified and cleaned down to the appendiceal base. The appendiceal artery was taken with Bovie cautery  maintaining hemostasis, the mesoappendix was then incised.  The the appendiceal base was clean.  At this time an Endoloop was placed proximallyx2 and one distally and the appendix was transected between these 2. A retreivial bag was then placed into the abdomen and the specimen placed in the bag. The appendiceal stump was cauterized. We evacuate the fluid from the pelvis until the effluent was clear. The omentum was brought over the appendiceal stump. The appendix a latex retrieval  bag was then retrieved via the supraumbilical port. #1 Vicryl was used to reapproximate the fascia at the umbilical port site x2. The skin was reapproximated all port sites 3-0 Monocryl subcuticular fashion. The skin was dressed with Steri-Strips gauze and tape. The patient was awakened from general anesthesia was taken to recovery room in stable condition.    PLAN OF CARE: Admit for overnight observation  PATIENT DISPOSITION:  PACU - hemodynamically stable.   Delay start of Pharmacological VTE agent (>24hrs) due to surgical blood loss or risk of bleeding: not applicable

## 2014-05-31 NOTE — Anesthesia Procedure Notes (Signed)
Procedure Name: Intubation Date/Time: 05/31/2014 9:32 PM Performed by: Minus LibertyAVENEL, Jaydrian Corpening Pre-anesthesia Checklist: Patient identified, Timeout performed, Emergency Drugs available, Suction available and Patient being monitored Patient Re-evaluated:Patient Re-evaluated prior to inductionOxygen Delivery Method: Circle system utilized Preoxygenation: Pre-oxygenation with 100% oxygen Intubation Type: IV induction Laryngoscope Size: Mac and 3 Grade View: Grade III Tube type: Oral Tube size: 7.0 mm Number of attempts: 1 Airway Equipment and Method: Stylet Placement Confirmation: ETT inserted through vocal cords under direct vision,  breath sounds checked- equal and bilateral,  positive ETCO2 and CO2 detector Secured at: 22 cm Tube secured with: Tape Dental Injury: Teeth and Oropharynx as per pre-operative assessment  Comments: RSI with cricoid pressure

## 2014-05-31 NOTE — ED Notes (Signed)
Pt here for returning pelvic pain. Seen here yesterday and dx with ovarian cyst.

## 2014-05-31 NOTE — H&P (Signed)
Bridget Huffman is an 28 y.o. female.   Chief Complaint: Abdominal pain HPI: Patient is a 28 year old female who is seen in the ER yesterday secondary to abdominal pain and the supraumbilical/right lower quadrant area. Patient states the pain began yesterday morning. She states that this was accompanied with nausea and vomiting. Upon evaluation ER patient underwent ultrasound.  Patient was directed to return today should the pain continued. Patient states that the pain continued with right lower quadrant and right upper quadrant pain. Patient underwent CT scan today which revealed 2 large fecaliths and some fluid around the appendix.  Past Medical History  Diagnosis Date  . No pertinent past medical history     Past Surgical History  Procedure Laterality Date  . No past surgeries      Family History  Problem Relation Age of Onset  . Alcohol abuse Neg Hx   . Arthritis Neg Hx   . Asthma Neg Hx   . Birth defects Neg Hx   . Cancer Neg Hx   . COPD Neg Hx   . Depression Neg Hx   . Diabetes Neg Hx   . Drug abuse Neg Hx   . Early death Neg Hx   . Hearing loss Neg Hx   . Heart disease Neg Hx   . Hyperlipidemia Neg Hx   . Learning disabilities Neg Hx   . Kidney disease Neg Hx   . Mental illness Neg Hx   . Mental retardation Neg Hx   . Miscarriages / Stillbirths Neg Hx   . Stroke Neg Hx   . Vision loss Neg Hx   . Hypertension Mother    Social History:  reports that she has never smoked. She does not have any smokeless tobacco history on file. She reports that she does not drink alcohol or use illicit drugs.  Allergies: No Known Allergies   (Not in a hospital admission)  Results for orders placed or performed during the hospital encounter of 05/31/14 (from the past 48 hour(s))  CBC with Differential     Status: Abnormal   Collection Time: 05/31/14  5:53 PM  Result Value Ref Range   WBC 12.0 (H) 4.0 - 10.5 K/uL   RBC 4.51 3.87 - 5.11 MIL/uL   Hemoglobin 13.6 12.0 - 15.0  g/dL   HCT 39.0 36.0 - 46.0 %   MCV 86.5 78.0 - 100.0 fL   MCH 30.2 26.0 - 34.0 pg   MCHC 34.9 30.0 - 36.0 g/dL   RDW 12.4 11.5 - 15.5 %   Platelets 203 150 - 400 K/uL   Neutrophils Relative % 80 (H) 43 - 77 %   Neutro Abs 9.7 (H) 1.7 - 7.7 K/uL   Lymphocytes Relative 12 12 - 46 %   Lymphs Abs 1.4 0.7 - 4.0 K/uL   Monocytes Relative 7 3 - 12 %   Monocytes Absolute 0.8 0.1 - 1.0 K/uL   Eosinophils Relative 0 0 - 5 %   Eosinophils Absolute 0.0 0.0 - 0.7 K/uL   Basophils Relative 1 0 - 1 %   Basophils Absolute 0.1 0.0 - 0.1 K/uL  Lipase, blood     Status: None   Collection Time: 05/31/14  5:53 PM  Result Value Ref Range   Lipase 24 11 - 59 U/L  Comprehensive metabolic panel     Status: Abnormal   Collection Time: 05/31/14  6:27 PM  Result Value Ref Range   Sodium 136 (L) 137 - 147 mEq/L   Potassium  3.7 3.7 - 5.3 mEq/L   Chloride 97 96 - 112 mEq/L   CO2 25 19 - 32 mEq/L   Glucose, Bld 115 (H) 70 - 99 mg/dL   BUN 14 6 - 23 mg/dL   Creatinine, Ser 0.72 0.50 - 1.10 mg/dL   Calcium 8.9 8.4 - 10.5 mg/dL   Total Protein 7.4 6.0 - 8.3 g/dL   Albumin 4.1 3.5 - 5.2 g/dL   AST 15 0 - 37 U/L   ALT 10 0 - 35 U/L   Alkaline Phosphatase 62 39 - 117 U/L   Total Bilirubin 1.1 0.3 - 1.2 mg/dL   GFR calc non Af Amer >90 >90 mL/min   GFR calc Af Amer >90 >90 mL/min    Comment: (NOTE) The eGFR has been calculated using the CKD EPI equation. This calculation has not been validated in all clinical situations. eGFR's persistently <90 mL/min signify possible Chronic Kidney Disease.    Anion gap 14 5 - 15   US Transvaginal Non-ob  05/30/2014   CLINICAL DATA:  Pelvic pain for 1 day.  Bilateral.  EXAM: TRANSABDOMINAL AND TRANSVAGINAL ULTRASOUND OF PELVIS  TECHNIQUE: Both transabdominal and transvaginal ultrasound examinations of the pelvis were performed. Transabdominal technique was performed for global imaging of the pelvis including uterus, ovaries, adnexal regions, and pelvic cul-de-sac. It  was necessary to proceed with endovaginal exam following the transabdominal exam to visualize the ovaries.  COMPARISON:  None  FINDINGS: Uterus  Measurements: Normal in size at 8.2 x 3.4 x 5.7 cm. No fibroids or other mass visualized.  Endometrium  Thickness: Normal in thickness for premenopausal female at 8 mm. No focal abnormality visualized.  Right ovary  Measurements: Normal in size at 3.6 x 2.6 x 2.6 cm. Normal small follicles.  Left ovary  Measurements: Normal in size at 3.7 x 1.8 x 2.6 cm. Normal small follicles.  Other findings  Small amount free fluid the posterior cul-de-sac  IMPRESSION: 1. Small amount free fluid. 2. Normal uterus and ovaries.   Electronically Signed   By: Suzy Bouchard M.D.   On: 05/30/2014 14:43   US Pelvis Complete  05/30/2014   CLINICAL DATA:  Pelvic pain for 1 day.  Bilateral.  EXAM: TRANSABDOMINAL AND TRANSVAGINAL ULTRASOUND OF PELVIS  TECHNIQUE: Both transabdominal and transvaginal ultrasound examinations of the pelvis were performed. Transabdominal technique was performed for global imaging of the pelvis including uterus, ovaries, adnexal regions, and pelvic cul-de-sac. It was necessary to proceed with endovaginal exam following the transabdominal exam to visualize the ovaries.  COMPARISON:  None  FINDINGS: Uterus  Measurements: Normal in size at 8.2 x 3.4 x 5.7 cm. No fibroids or other mass visualized.  Endometrium  Thickness: Normal in thickness for premenopausal female at 8 mm. No focal abnormality visualized.  Right ovary  Measurements: Normal in size at 3.6 x 2.6 x 2.6 cm. Normal small follicles.  Left ovary  Measurements: Normal in size at 3.7 x 1.8 x 2.6 cm. Normal small follicles.  Other findings  Small amount free fluid the posterior cul-de-sac  IMPRESSION: 1. Small amount free fluid. 2. Normal uterus and ovaries.   Electronically Signed   By: Suzy Bouchard M.D.   On: 05/30/2014 14:43   Ct Abdomen Pelvis W Contrast  05/31/2014   CLINICAL DATA:  Lower  abdominal and pelvic pain  EXAM: CT ABDOMEN AND PELVIS WITH CONTRAST  TECHNIQUE: Multidetector CT imaging of the abdomen and pelvis was performed using the standard protocol following bolus administration of  intravenous contrast.  CONTRAST:  125m OMNIPAQUE IOHEXOL 300 MG/ML  SOLN  COMPARISON:  None.  FINDINGS: Lung bases are clear.  There are bilateral breast implants.  No focal liver lesions are identified. Gallbladder wall is not appreciably thickened. There is no biliary duct dilatation.  Spleen, pancreas, and adrenals appear normal. There is a focal area of decreased attenuation in the anterior left mid kidney measuring 1.5 x 0.8 cm which has attenuation values higher than is expected with a simple cyst. There is no other renal mass. There is no hydronephrosis on either side. No renal or ureteral calculus is identified on either side.  In the pelvis, there is mild thickening of the urinary bladder wall. There is moderate free fluid in the cul-de-sac. The endometrium appears rather prominent.  There are two large appendicolith in the appendix. The appendix is diffusely enlarged with surrounding mesenteric thickening and surrounding fluid consistent with appendicitis. Inflammation surrounds the right ovary. There is a probable follicle in the right ovary which is distorted by the surrounding inflammation.  There is some wall thickening in the lateral cecum, felt to be due to the adjacent appendicitis. There is no frank bowel obstruction. No free air or portal venous air.  There is no appreciable adenopathy in the abdomen or pelvis. A well-defined abscess is not seen.  Aorta shows no aneurysm. There are no blastic or lytic bone lesions.  IMPRESSION: Evidence of acute appendicitis. There may well be appendiceal rupture. Fluid surrounds the right ovary. Fluid also tracks around the uterus into the cul-de-sac. Mild wall thickening in the urinary bladder may be secondary to fluid from the appendicitis. No frank bowel  obstruction.  There is a mass in the medial mid left kidney measuring 1.5 x 0.8 cm which cannot be classified as a cyst. This mass warrants nonemergent further surveillance. Further evaluation with pre and post contrast MRI or CT should be considered. MRI is preferred in younger patients (due to lack of ionizing radiation) and for evaluating calcified lesion(s).  Critical Value/emergent results were called by telephone at the time of interpretation on 05/31/2014 at 7:13 pm to Dr. MTanna Furry, who verbally acknowledged these results.   Electronically Signed   By: WLowella GripM.D.   On: 05/31/2014 19:15    Review of Systems  Constitutional: Negative for weight loss.  HENT: Negative for ear discharge, ear pain, hearing loss and tinnitus.   Eyes: Negative for blurred vision, double vision, photophobia and pain.  Respiratory: Negative for cough, sputum production and shortness of breath.   Cardiovascular: Negative for chest pain.  Gastrointestinal: Positive for nausea, vomiting and abdominal pain.  Genitourinary: Negative for dysuria, urgency, frequency and flank pain.  Musculoskeletal: Negative for myalgias, back pain, joint pain, falls and neck pain.  Neurological: Negative for dizziness, tingling, sensory change, focal weakness, loss of consciousness and headaches.  Endo/Heme/Allergies: Does not bruise/bleed easily.  Psychiatric/Behavioral: Negative for depression, memory loss and substance abuse. The patient is not nervous/anxious.     Blood pressure 110/70, pulse 114, temperature 98.5 F (36.9 C), temperature source Oral, resp. rate 18, SpO2 100 %, unknown if currently breastfeeding. Physical Exam  Vitals reviewed. Constitutional: She is oriented to person, place, and time. She appears well-developed and well-nourished. She is cooperative. No distress. Cervical collar and nasal cannula in place.  HENT:  Head: Normocephalic and atraumatic. Head is without raccoon's eyes, without Battle's  sign, without abrasion, without contusion and without laceration.  Right Ear: Hearing, tympanic membrane, external ear  and ear canal normal. No lacerations. No drainage or tenderness. No foreign bodies. Tympanic membrane is not perforated. No hemotympanum.  Left Ear: Hearing, tympanic membrane, external ear and ear canal normal. No lacerations. No drainage or tenderness. No foreign bodies. Tympanic membrane is not perforated. No hemotympanum.  Nose: Nose normal. No nose lacerations, sinus tenderness, nasal deformity or nasal septal hematoma. No epistaxis.  Mouth/Throat: Uvula is midline, oropharynx is clear and moist and mucous membranes are normal. No lacerations.  Eyes: Conjunctivae, EOM and lids are normal. Pupils are equal, round, and reactive to light. No scleral icterus.  Neck: Trachea normal. No JVD present. No spinous process tenderness and no muscular tenderness present. Carotid bruit is not present. No thyromegaly present.  Cardiovascular: Normal rate, regular rhythm, normal heart sounds, intact distal pulses and normal pulses.   Respiratory: Effort normal and breath sounds normal. No respiratory distress. She exhibits no tenderness, no bony tenderness, no laceration and no crepitus.  GI: Soft. Normal appearance. She exhibits no distension. Bowel sounds are decreased. Tenderness: RLQ. There is no rigidity, no rebound, no guarding and no CVA tenderness.  Musculoskeletal: Normal range of motion. She exhibits no edema or tenderness.  Lymphadenopathy:    She has no cervical adenopathy.  Neurological: She is alert and oriented to person, place, and time. She has normal strength. No cranial nerve deficit or sensory deficit. GCS eye subscore is 4. GCS verbal subscore is 5. GCS motor subscore is 6.  Skin: Skin is warm, dry and intact. She is not diaphoretic.  Psychiatric: She has a normal mood and affect. Her speech is normal and behavior is normal.     Assessment/Plan 28 year old female with  acute appendicitis, possibly perforated.  1. We'll proceed to the operative for lap appendectomy 2. I discussed the risks benefits of the procedure with the patient to include but not limited to: Infection, bleeding, damage to surrounding structures, possible ileus, possible future abscesses. The patient was understanding and wishes to proceed.  Rosario Jacks., Candy Ziegler 05/31/2014, 8:28 PM

## 2014-05-31 NOTE — Anesthesia Preprocedure Evaluation (Signed)
Anesthesia Evaluation  Patient identified by MRN, date of birth, ID band Patient awake    Airway Mallampati: I  TM Distance: >3 FB Neck ROM: Full    Dental  (+) Teeth Intact, Dental Advisory Given   Pulmonary          Cardiovascular hypertension,     Neuro/Psych negative neurological ROS  negative psych ROS   GI/Hepatic negative GI ROS, Neg liver ROS,   Endo/Other  negative endocrine ROS  Renal/GU negative Renal ROS     Musculoskeletal negative musculoskeletal ROS (+)   Abdominal   Peds  Hematology negative hematology ROS (+)   Anesthesia Other Findings   Reproductive/Obstetrics negative OB ROS                             Anesthesia Physical Anesthesia Plan  ASA: II  Anesthesia Plan:    Post-op Pain Management:    Induction: Intravenous  Airway Management Planned: Oral ETT  Additional Equipment:   Intra-op Plan:   Post-operative Plan: Extubation in OR  Informed Consent: I have reviewed the patients History and Physical, chart, labs and discussed the procedure including the risks, benefits and alternatives for the proposed anesthesia with the patient or authorized representative who has indicated his/her understanding and acceptance.   Dental advisory given  Plan Discussed with: CRNA, Anesthesiologist and Surgeon  Anesthesia Plan Comments:         Anesthesia Quick Evaluation

## 2014-06-01 ENCOUNTER — Encounter (HOSPITAL_COMMUNITY): Payer: Self-pay | Admitting: General Surgery

## 2014-06-01 DIAGNOSIS — R1031 Right lower quadrant pain: Secondary | ICD-10-CM | POA: Diagnosis not present

## 2014-06-01 DIAGNOSIS — K3532 Acute appendicitis with perforation and localized peritonitis, without abscess: Secondary | ICD-10-CM | POA: Diagnosis present

## 2014-06-01 DIAGNOSIS — K352 Acute appendicitis with generalized peritonitis: Secondary | ICD-10-CM | POA: Diagnosis present

## 2014-06-01 DIAGNOSIS — Z8249 Family history of ischemic heart disease and other diseases of the circulatory system: Secondary | ICD-10-CM | POA: Diagnosis not present

## 2014-06-01 LAB — BASIC METABOLIC PANEL
ANION GAP: 4 — AB (ref 5–15)
BUN: 7 mg/dL (ref 6–23)
CALCIUM: 7.9 mg/dL — AB (ref 8.4–10.5)
CO2: 26 mmol/L (ref 19–32)
CREATININE: 0.74 mg/dL (ref 0.50–1.10)
Chloride: 103 mEq/L (ref 96–112)
GFR calc non Af Amer: >90 mL/min (ref >90)
Glucose, Bld: 143 mg/dL — ABNORMAL HIGH (ref 70–99)
Potassium: 3.5 mmol/L (ref 3.5–5.1)
Sodium: 133 mmol/L — ABNORMAL LOW (ref 135–145)

## 2014-06-01 LAB — CBC
HCT: 34.3 % — ABNORMAL LOW (ref 36.0–46.0)
Hemoglobin: 11.4 g/dL — ABNORMAL LOW (ref 12.0–15.0)
MCH: 28.9 pg (ref 26.0–34.0)
MCHC: 33.2 g/dL (ref 30.0–36.0)
MCV: 87.1 fL (ref 78.0–100.0)
PLATELETS: 185 10*3/uL (ref 150–400)
RBC: 3.94 MIL/uL (ref 3.87–5.11)
RDW: 12.5 % (ref 11.5–15.5)
WBC: 10.7 10*3/uL — AB (ref 4.0–10.5)

## 2014-06-01 MED ORDER — OXYCODONE-ACETAMINOPHEN 5-325 MG PO TABS
1.0000 | ORAL_TABLET | ORAL | Status: DC | PRN
  Administered 2014-06-01 – 2014-06-02 (×7): 2 via ORAL
  Filled 2014-06-01 (×7): qty 2

## 2014-06-01 MED ORDER — HYDROMORPHONE HCL 1 MG/ML IJ SOLN: 1.0000 mg | INTRAMUSCULAR | Status: DC | PRN

## 2014-06-01 MED ORDER — ONDANSETRON HCL 4 MG PO TABS
4.0000 mg | ORAL_TABLET | Freq: Four times a day (QID) | ORAL | Status: DC | PRN
Start: 2014-06-01 — End: 2014-06-03

## 2014-06-01 MED ORDER — ONDANSETRON HCL 4 MG/2ML IJ SOLN: 4.0000 mg | Freq: Four times a day (QID) | INTRAMUSCULAR | Status: DC | PRN

## 2014-06-01 NOTE — Progress Notes (Signed)
UR completed 

## 2014-06-01 NOTE — Progress Notes (Signed)
Patient ID: Bridget PilgrimYaneli Huffman, female   DOB: 05/11/1986, 28 y.o.   MRN: [id number] 1 Day Post-Op  Subjective: Bridget Huffman feels ok this morning, just having some pain.  Tolerating her clear liquids  Objective: Vital signs in last 24 hours: Temp:  [98.1 F (36.7 C)-100.1 F (37.8 C)] 98.2 F (36.8 C) (12/22 0621) Pulse Rate:  [85-120] 85 (12/22 0621) Resp:  [16-21] 17 (12/22 0621) BP: (101-121)/(59-73) 101/59 mmHg (12/22 0621) SpO2:  [96 %-100 %] 99 % (12/22 0621) Weight:  [165 lb (74.844 kg)] 165 lb (74.844 kg) (12/21 2340)    Intake/Output from previous day: 12/21 0701 - 12/22 0700 In: 1200 [I.V.:1200] Out: 347 [Urine:200; Drains:137; Blood:10] Intake/Output this shift: Total I/O In: -  Out: 50 [Drains:50]  PE: Abd: soft, appropriately tender, incisions c/d/i, +BS, ND  Lab Results:   Recent Labs  05/31/14 1753 06/01/14 0450  WBC 12.0* 10.7*  HGB 13.6 11.4*  HCT 39.0 34.3*  PLT 203 185   BMET  Recent Labs  05/31/14 1827 06/01/14 0450  NA 136* 133*  K 3.7 3.5  CL 97 103  CO2 25 26  GLUCOSE 115* 143*  BUN 14 7  CREATININE 0.72 0.74  CALCIUM 8.9 7.9*   Bridget Huffman/INR No results for input(s): LABPROT, INR in the last 72 hours. CMP     Component Value Date/Time   NA 133* 06/01/2014 0450   K 3.5 06/01/2014 0450   CL 103 06/01/2014 0450   CO2 26 06/01/2014 0450   GLUCOSE 143* 06/01/2014 0450   BUN 7 06/01/2014 0450   CREATININE 0.74 06/01/2014 0450   CREATININE 0.76 02/09/2014 1140   CALCIUM 7.9* 06/01/2014 0450   PROT 7.4 05/31/2014 1827   ALBUMIN 4.1 05/31/2014 1827   AST 15 05/31/2014 1827   ALT 10 05/31/2014 1827   ALKPHOS 62 05/31/2014 1827   BILITOT 1.1 05/31/2014 1827   GFRNONAA >90 06/01/2014 0450   GFRNONAA >89 02/09/2014 1140   GFRAA >90 06/01/2014 0450   GFRAA >89 02/09/2014 1140   Lipase     Component Value Date/Time   LIPASE 24 05/31/2014 1753       Studies/Results: [place] Transvaginal Non-ob  05/30/2014   CLINICAL DATA:  Pelvic pain for  1 day.  Bilateral.  EXAM: TRANSABDOMINAL AND TRANSVAGINAL ULTRASOUND OF PELVIS  TECHNIQUE: Both transabdominal and transvaginal ultrasound examinations of the pelvis were performed. Transabdominal technique was performed for global imaging of the pelvis including uterus, ovaries, adnexal regions, and pelvic cul-de-sac. It was necessary to proceed with endovaginal exam following the transabdominal exam to visualize the ovaries.  COMPARISON:  None  FINDINGS: Uterus  Measurements: Normal in size at 8.2 x 3.4 x 5.7 cm. No fibroids or other mass visualized.  Endometrium  Thickness: Normal in thickness for premenopausal female at 8 mm. No focal abnormality visualized.  Right ovary  Measurements: Normal in size at 3.6 x 2.6 x 2.6 cm. Normal small follicles.  Left ovary  Measurements: Normal in size at 3.7 x 1.8 x 2.6 cm. Normal small follicles.  Other findings  Small amount free fluid the posterior cul-de-sac  IMPRESSION: 1. Small amount free fluid. 2. Normal uterus and ovaries.   Electronically Signed   By: Genevive BiStewart  Edmunds M.D.   On: 05/30/2014 14:43   [place] Pelvis Complete  05/30/2014   CLINICAL DATA:  Pelvic pain for 1 day.  Bilateral.  EXAM: TRANSABDOMINAL AND TRANSVAGINAL ULTRASOUND OF PELVIS  TECHNIQUE: Both transabdominal and transvaginal ultrasound examinations of the pelvis were performed. Transabdominal technique  was performed for global imaging of the pelvis including uterus, ovaries, adnexal regions, and pelvic cul-de-sac. It was necessary to proceed with endovaginal exam following the transabdominal exam to visualize the ovaries.  COMPARISON:  None  FINDINGS: Uterus  Measurements: Normal in size at 8.2 x 3.4 x 5.7 cm. No fibroids or other mass visualized.  Endometrium  Thickness: Normal in thickness for premenopausal female at 8 mm. No focal abnormality visualized.  Right ovary  Measurements: Normal in size at 3.6 x 2.6 x 2.6 cm. Normal small follicles.  Left ovary  Measurements: Normal in size at 3.7 x 1.8  x 2.6 cm. Normal small follicles.  Other findings  Small amount free fluid the posterior cul-de-sac  IMPRESSION: 1. Small amount free fluid. 2. Normal uterus and ovaries.   Electronically Signed   By: Genevive BiStewart  Edmunds M.D.   On: 05/30/2014 14:43   Ct Abdomen Pelvis W Contrast  05/31/2014   CLINICAL DATA:  Lower abdominal and pelvic pain  EXAM: CT ABDOMEN AND PELVIS WITH CONTRAST  TECHNIQUE: Multidetector CT imaging of the abdomen and pelvis was performed using the standard protocol following bolus administration of intravenous contrast.  CONTRAST:  100mL OMNIPAQUE IOHEXOL 300 MG/ML  SOLN  COMPARISON:  None.  FINDINGS: Lung bases are clear.  There are bilateral breast implants.  No focal liver lesions are identified. Gallbladder wall is not appreciably thickened. There is no biliary duct dilatation.  Spleen, pancreas, and adrenals appear normal. There is a focal area of decreased attenuation in the anterior left mid kidney measuring 1.5 x 0.8 cm which has attenuation values higher than is expected with a simple cyst. There is no other renal mass. There is no hydronephrosis on either side. No renal or ureteral calculus is identified on either side.  In the pelvis, there is mild thickening of the urinary bladder wall. There is moderate free fluid in the cul-de-sac. The endometrium appears rather prominent.  There are two large appendicolith in the appendix. The appendix is diffusely enlarged with surrounding mesenteric thickening and surrounding fluid consistent with appendicitis. Inflammation surrounds the right ovary. There is a probable follicle in the right ovary which is distorted by the surrounding inflammation.  There is some wall thickening in the lateral cecum, felt to be due to the adjacent appendicitis. There is no frank bowel obstruction. No free air or portal venous air.  There is no appreciable adenopathy in the abdomen or pelvis. A well-defined abscess is not seen.  Aorta shows no aneurysm. There are  no blastic or lytic bone lesions.  IMPRESSION: Evidence of acute appendicitis. There may well be appendiceal rupture. Fluid surrounds the right ovary. Fluid also tracks around the uterus into the cul-de-sac. Mild wall thickening in the urinary bladder may be secondary to fluid from the appendicitis. No frank bowel obstruction.  There is a mass in the medial mid left kidney measuring 1.5 x 0.8 cm which cannot be classified as a cyst. This mass warrants nonemergent further surveillance. Further evaluation with pre and post contrast MRI or CT should be considered. MRI is preferred in younger patients (due to lack of ionizing radiation) and for evaluating calcified lesion(s).  Critical Value/emergent results were called by telephone at the time of interpretation on 05/31/2014 at 7:13 pm to Dr. Rolland PorterMARK JAMES , who verbally acknowledged these results.   Electronically Signed   By: Bretta BangWilliam  Woodruff M.D.   On: 05/31/2014 19:15    Anti-infectives: Anti-infectives    Start     Dose/Rate  Route Frequency Ordered Stop   05/31/14 2315  piperacillin-tazobactam (ZOSYN) IVPB 3.375 g     3.375 g12.5 mL/hr over 240 Minutes Intravenous 3 times per day 05/31/14 2313     05/31/14 1945  piperacillin-tazobactam (ZOSYN) IVPB 3.375 g     3.375 g12.5 mL/hr over 240 Minutes Intravenous  Once 05/31/14 1940 05/31/14 2030       Assessment/Plan  1.  POD 1, s/p lap appy for perforated appendicitis  Plan: 1. Cont IV abx therapy, D1 zosyn 2. Advance diet as tolerates.  Maybe home tomorrow, if able.  LOS: 1 day    Breyon Blass E 06/01/2014, 9:39 AM Pager: [phone]

## 2014-06-01 NOTE — Progress Notes (Signed)
ANTIBIOTIC CONSULT NOTE - INITIAL  Pharmacy Consult for zosyn Indication: intraabdominal infection  No Known Allergies  Patient Measurements: Height: 5\' 3"  (160 cm) Weight: 165 lb (74.844 kg) IBW/kg (Calculated) : 52.4   Vital Signs: Temp: 98.2 F (36.8 C) (12/22 0621) Temp Source: Oral (12/21 2322) BP: 101/59 mmHg (12/22 0621) Pulse Rate: 85 (12/22 0621) Intake/Output from previous day: 12/21 0701 - 12/22 0700 In: 1200 [I.V.:1200] Out: 347 [Urine:200; Drains:137; Blood:10] Intake/Output from this shift: Total I/O In: 1200 [I.V.:1200] Out: 347 [Urine:200; Drains:137; Blood:10]  Labs:  Recent Labs  05/30/14 1238 05/31/14 1753 05/31/14 1827 06/01/14 0450  WBC 15.9* 12.0*  --  10.7*  HGB 13.8 13.6  --  11.4*  PLT 230 203  --  185  CREATININE 0.61  --  0.72 0.74   Estimated Creatinine Clearance: 101.5 mL/min (by C-G formula based on Cr of 0.74). No results for input(s): VANCOTROUGH, VANCOPEAK, VANCORANDOM, GENTTROUGH, GENTPEAK, GENTRANDOM, TOBRATROUGH, TOBRAPEAK, TOBRARND, AMIKACINPEAK, AMIKACINTROU, AMIKACIN in the last 72 hours.   Microbiology: Recent Results (from the past 720 hour(s))  GC/Chlamydia Probe Amp     Status: None   Collection Time: 05/30/14  3:20 PM  Result Value Ref Range Status   CT Probe RNA NEGATIVE NEGATIVE Final   GC Probe RNA NEGATIVE NEGATIVE Final    Comment: (NOTE)                                                                                       **Normal Reference Range: Negative**      Assay performed using the Gen-Probe APTIMA COMBO2 (R) Assay. Acceptable specimen types for this assay include APTIMA Swabs (Unisex, endocervical, urethral, or vaginal), first void urine, and ThinPrep liquid based cytology samples. Performed at [facility]   Wet prep, genital     Status: Abnormal   Collection Time: 05/30/14  3:20 PM  Result Value Ref Range Status   Yeast Wet Prep HPF POC NONE SEEN NONE SEEN Final   Trich, Wet Prep NONE  SEEN NONE SEEN Final   Clue Cells Wet Prep HPF POC FEW (A) NONE SEEN Final   WBC, Wet Prep HPF POC FEW (A) NONE SEEN Final    Medical History: Past Medical History  Diagnosis Date  . No pertinent past medical history     Medications:  Prescriptions prior to admission  Medication Sig Dispense Refill Last Dose  . HYDROcodone-acetaminophen (NORCO/VICODIN) 5-325 MG per tablet Take 1 tablet by mouth every 4 (four) hours as needed. 10 tablet 0 05/31/2014 at Unknown time  . ibuprofen (ADVIL,MOTRIN) 600 MG tablet Take 1 tablet (600 mg total) by mouth every 6 (six) hours as needed for pain. 30 tablet 1 Not Taking  . naproxen (NAPROSYN) 500 MG tablet Take 1 tablet (500 mg total) by mouth 2 (two) times daily. 30 tablet 0   . ondansetron (ZOFRAN ODT) 4 MG disintegrating tablet Take 1 tablet (4 mg total) by mouth every 8 (eight) hours as needed for nausea. 6 tablet 0    Assessment: 28 yo lady who presented with pelvic pain 12/20.  It was though she had ruptures ovarian cyst.  She came back to  ED 12/21 with worsening symptoms. She had lap appy 12/21 pm and she will continue on zosyn. Her renal function is good.  No dosage adjustment is warranted.  Goal of Therapy:  Eradication of infection  Plan:  Cont zosyn 3.375 gm IV q8 hours. Pharmacy will sign off.  Please advise if we can be of further assistance  Thanks for allowing pharmacy to be a part of this patient's care.  Talbert CageLora Lillie Bollig, PharmD Clinical Pharmacist, [phone]  06/01/2014,6:45 AM

## 2014-06-01 NOTE — Anesthesia Postprocedure Evaluation (Signed)
  Anesthesia Post-op Note  Patient: Bridget Huffman  Procedure(s) Performed: Procedure(s): APPENDECTOMY LAPAROSCOPIC (N/A)  Patient Location: PACU  Anesthesia Type:General  Level of Consciousness: awake and alert   Airway and Oxygen Therapy: Patient Spontanous Breathing  Post-op Pain: none  Post-op Assessment: Post-op Vital signs reviewed  Post-op Vital Signs: Reviewed  Last Vitals:  Filed Vitals:   06/01/14 0621  BP: 101/59  Pulse: 85  Temp: 36.8 C  Resp: 17    Complications: No apparent anesthesia complications

## 2014-06-02 LAB — CBC
HCT: 31.2 % — ABNORMAL LOW (ref 36.0–46.0)
Hemoglobin: 10.5 g/dL — ABNORMAL LOW (ref 12.0–15.0)
MCH: 29.5 pg (ref 26.0–34.0)
MCHC: 33.7 g/dL (ref 30.0–36.0)
MCV: 87.6 fL (ref 78.0–100.0)
Platelets: 179 10*3/uL (ref 150–400)
RBC: 3.56 MIL/uL — ABNORMAL LOW (ref 3.87–5.11)
RDW: 12.7 % (ref 11.5–15.5)
WBC: 9 10*3/uL (ref 4.0–10.5)

## 2014-06-02 MED ORDER — OXYCODONE-ACETAMINOPHEN 5-325 MG PO TABS: 1.0000 | ORAL_TABLET | Freq: Four times a day (QID) | ORAL | Status: DC | PRN

## 2014-06-02 MED ORDER — AMOXICILLIN-POT CLAVULANATE 875-125 MG PO TABS: 1.0000 | ORAL_TABLET | Freq: Two times a day (BID) | ORAL | Status: DC

## 2014-06-02 NOTE — Discharge Instructions (Signed)

## 2014-06-02 NOTE — Progress Notes (Signed)
[facility] Progress Note  2 Days Post-Op  Subjective: Pt's pain well controlled, but she c/o nausea, no vomiting.  She tolerating an orange this am, but not much appetite.  Tolerating water well.  Ambulating some OOB, but will do more today.  Urinating well.    Objective: Vital signs in last 24 hours: Temp:  [97.7 F (36.5 C)-98.4 F (36.9 C)] 98.3 F (36.8 C) (12/23 0524) Pulse Rate:  [77-100] 91 (12/23 0524) Resp:  [16-17] 16 (12/23 0524) BP: (93-100)/(50-63) 98/50 mmHg (12/23 0524) SpO2:  [93 %-100 %] 93 % (12/23 0524) Last BM Date: 05/31/14  Intake/Output from previous day: 12/22 0701 - 12/23 0700 In: 1882.1 [P.O.:980; I.V.:802.1; IV Piggyback:100] Out: 163 [Urine:3; Drains:160] Intake/Output this shift: Total I/O In: 120 [P.O.:120] Out: -   PE: Gen:  Alert, NAD, pleasant Abd: Soft, mild tenderness, ND, +BS, no HSM, incisions C/D/I with dermabond in place, drain with 160mL serosanguinous slightly cloudy drainage   Lab Results:   Recent Labs  06/01/14 0450 06/02/14 0331  WBC 10.7* 9.0  HGB 11.4* 10.5*  HCT 34.3* 31.2*  PLT 185 179   BMET  Recent Labs  05/31/14 1827 06/01/14 0450  NA 136* 133*  K 3.7 3.5  CL 97 103  CO2 25 26  GLUCOSE 115* 143*  BUN 14 7  CREATININE 0.72 0.74  CALCIUM 8.9 7.9*   PT/INR No results for input(s): LABPROT, INR in the last 72 hours. CMP     Component Value Date/Time   NA 133* 06/01/2014 0450   K 3.5 06/01/2014 0450   CL 103 06/01/2014 0450   CO2 26 06/01/2014 0450   GLUCOSE 143* 06/01/2014 0450   BUN 7 06/01/2014 0450   CREATININE 0.74 06/01/2014 0450   CREATININE 0.76 02/09/2014 1140   CALCIUM 7.9* 06/01/2014 0450   PROT 7.4 05/31/2014 1827   ALBUMIN 4.1 05/31/2014 1827   AST 15 05/31/2014 1827   ALT 10 05/31/2014 1827   ALKPHOS 62 05/31/2014 1827   BILITOT 1.1 05/31/2014 1827   GFRNONAA >90 06/01/2014 0450   GFRNONAA >89 02/09/2014 1140   GFRAA >90 06/01/2014 0450   GFRAA >89 02/09/2014  1140   Lipase     Component Value Date/Time   LIPASE 24 05/31/2014 1753       Studies/Results: Ct Abdomen Pelvis W Contrast  05/31/2014   CLINICAL DATA:  Lower abdominal and pelvic pain  EXAM: CT ABDOMEN AND PELVIS WITH CONTRAST  TECHNIQUE: Multidetector CT imaging of the abdomen and pelvis was performed using the standard protocol following bolus administration of intravenous contrast.  CONTRAST:  100mL OMNIPAQUE IOHEXOL 300 MG/ML  SOLN  COMPARISON:  None.  FINDINGS: Lung bases are clear.  There are bilateral breast implants.  No focal liver lesions are identified. Gallbladder wall is not appreciably thickened. There is no biliary duct dilatation.  Spleen, pancreas, and adrenals appear normal. There is a focal area of decreased attenuation in the anterior left mid kidney measuring 1.5 x 0.8 cm which has attenuation values higher than is expected with a simple cyst. There is no other renal mass. There is no hydronephrosis on either side. No renal or ureteral calculus is identified on either side.  In the pelvis, there is mild thickening of the urinary bladder wall. There is moderate free fluid in the cul-de-sac. The endometrium appears rather prominent.  There are two large appendicolith in the appendix. The appendix is diffusely enlarged with surrounding mesenteric thickening and surrounding fluid consistent with appendicitis. Inflammation  surrounds the right ovary. There is a probable follicle in the right ovary which is distorted by the surrounding inflammation.  There is some wall thickening in the lateral cecum, felt to be due to the adjacent appendicitis. There is no frank bowel obstruction. No free air or portal venous air.  There is no appreciable adenopathy in the abdomen or pelvis. A well-defined abscess is not seen.  Aorta shows no aneurysm. There are no blastic or lytic bone lesions.  IMPRESSION: Evidence of acute appendicitis. There may well be appendiceal rupture. Fluid surrounds the  right ovary. Fluid also tracks around the uterus into the cul-de-sac. Mild wall thickening in the urinary bladder may be secondary to fluid from the appendicitis. No frank bowel obstruction.  There is a mass in the medial mid left kidney measuring 1.5 x 0.8 cm which cannot be classified as a cyst. This mass warrants nonemergent further surveillance. Further evaluation with pre and post contrast MRI or CT should be considered. MRI is preferred in younger patients (due to lack of ionizing radiation) and for evaluating calcified lesion(s).  Critical Value/emergent results were called by telephone at the time of interpretation on 05/31/2014 at 7:13 pm to Dr. Rolland PorterMARK JAMES , who verbally acknowledged these results.   Electronically Signed   By: Bretta BangWilliam  Woodruff M.D.   On: 05/31/2014 19:15    Anti-infectives: Anti-infectives    Start     Dose/Rate Route Frequency Ordered Stop   06/02/14 0000  amoxicillin-clavulanate (AUGMENTIN) 875-125 MG per tablet     1 tablet Oral 2 times daily 06/02/14 0936     05/31/14 2315  piperacillin-tazobactam (ZOSYN) IVPB 3.375 g     3.375 g12.5 mL/hr over 240 Minutes Intravenous 3 times per day 05/31/14 2313     05/31/14 1945  piperacillin-tazobactam (ZOSYN) IVPB 3.375 g     3.375 g12.5 mL/hr over 240 Minutes Intravenous  Once 05/31/14 1940 05/31/14 2030       Assessment/Plan 1. POD #2, s/p lap appy for perforated appendicitis  Plan: 1. Cont IV abx therapy, D2 zosyn 2. Patient is quite nauseated, but no vomiting, may be caused from IV pain meds or taking meds on empty stomach 3. Encouraged ambulating IS 4. Consider starting lovenox if she stays 5. CBC normal and afebrile 6. Red. IVF, if doing better this afternoon, may still be able to d/c home     LOS: 2 days    DORT, [facility] 06/02/2014, 10:19 AM Pager: [phone]

## 2014-06-02 NOTE — Discharge Summary (Signed)
[facility] Discharge Summary   Patient ID: Bridget PilgrimYaneli Huffman MRN: [id number] DOB/AGE: 28/12/1985 28 y.o.  Admit date: 05/31/2014 Discharge date: 06/03/2014  Admitting Diagnosis: Acute appendicitis  Discharge Diagnosis Patient Active Problem List   Diagnosis Date Noted  . Perforated appendicitis 06/01/2014  . Acute appendicitis 05/31/2014  . Annual physical exam 02/09/2014  . Pap smear for cervical cancer screening 02/09/2014  . NSVD (normal spontaneous vaginal delivery) 03/29/2012  . Mild preeclampsia delivered 03/29/2012  . GBS (group B streptococcus) UTI complicating pregnancy 08/21/2011    Consultants None  Imaging: No results found.  Procedures Dr. Derrell Lollingamirez (05/31/14) - Laparoscopic Appendectomy  Hospital Course:  28 year old female who is seen in the ER yesterday secondary to abdominal pain and the supraumbilical/right lower quadrant area. Patient states the pain began yesterday morning. She states that this was accompanied with nausea and vomiting. Upon evaluation ER patient underwent ultrasound.  Patient was directed to return today should the pain continued. Patient states that the pain continued with right lower quadrant and right upper quadrant pain. Patient underwent CT scan today which revealed 2 large fecaliths and some fluid around the appendix.  She was started on IV antibiotics.  Patient was admitted and underwent procedure listed above.  Was found to have a perforated appendix.  Tolerated procedure well and was transferred to the floor.  Diet was advanced as tolerated.  WBC is now normal and she is afebrile.  Had some nausea related to her pain medication, but now resolved with PO pain meds.  JP drain will be discontinued since the output is minimal and it is serosanguinous.  On POD #3, the patient was voiding well, tolerating diet, ambulating well, pain well controlled, vital signs stable, incisions c/d/i and felt stable for discharge home.   Patient will follow up in our office in 2-3 weeks and knows to call with questions or concerns.  She will continue on 10 additional days of Augmentin to help resolve the infection.     Physical Exam: General:  Alert, NAD, pleasant, comfortable Abd:  Soft, ND, mild tenderness, incisions C/D/I    Medication List    STOP taking these medications        HYDROcodone-acetaminophen 5-325 MG per tablet  Commonly known as:  NORCO/VICODIN     ibuprofen 600 MG tablet  Commonly known as:  ADVIL,MOTRIN     naproxen 500 MG tablet  Commonly known as:  NAPROSYN     ondansetron 4 MG disintegrating tablet  Commonly known as:  ZOFRAN ODT      TAKE these medications        amoxicillin-clavulanate 875-125 MG per tablet  Commonly known as:  AUGMENTIN  Take 1 tablet by mouth 2 (two) times daily.     oxyCODONE-acetaminophen 5-325 MG per tablet  Commonly known as:  PERCOCET/ROXICET  Take 1-2 tablets by mouth every 6 (six) hours as needed for moderate pain.         Follow-up Information    [facility] [facility].   Why:  For post-operation check. Your appointment is at 3:00pm, please arrive at least 30 min before your appointment to complete your check in paperwork.  If you are unable to arrive 30 min prior to your appointment time we may have to cancel or reschedule you   Contact information:   [address]   [place] [address] [phone]       Signed: Aris GeorgiaMegan Dort, PA-C [facility] [phone]  06/03/2014, 8:53 AM

## 2014-06-03 NOTE — Progress Notes (Signed)
Discussed discharge summary with patient. Reviewed all medications with patient. JP drain removed per MD order. Patient received Rx. Patient ready for discharge.

## 2014-06-06 ENCOUNTER — Telehealth (INDEPENDENT_AMBULATORY_CARE_PROVIDER_SITE_OTHER): Payer: Self-pay | Admitting: Surgery

## 2014-06-06 NOTE — Telephone Encounter (Signed)
Bridget Huffman  03/04/1986 [id number]  Patient Care Team: Quentin Angstlugbemiga E Jegede, MD as PCP - General (Internal Medicine)  This patient is a 28 y.o.female who calls today for surgical evaluation.   Date of procedure/visit: 05/31/2014  Surgery: Bridget Huffman 28 y.o. female  PRE-OPERATIVE DIAGNOSIS: Acute Appendicitis  POST-OPERATIVE DIAGNOSIS: Perforated Appendicitis  PROCEDURE: Procedure(s): APPENDECTOMY LAPAROSCOPIC (N/A)  SURGEON: Surgeon(s) and Role:  * Axel FillerArmando Ramirez, MD - Primary  Reason for call: Persistent pain  Patient has been home from 3 days.  Required emergent surgery for perforated appendicitis.  Stayed in the hospital for several days after surgery.  She went home on oral Augmentin.  She still taking that.  She notes that she has some nausea.  Appetite is decreased.  She is tolerating most liquids and some bland foods.  She is defecating.  No severe diarrhea.  No vomiting.  She is disappointed that she still has pain on POD#6.  No fevers or chills.  I noted in the situation of perforated appendicitis it is not surprising that she is not completely recovered less than a week out.  I noted as long as pain is going down, eating better, bowels regular; then, OK to follow and should improve with time.  I strongly recommend she improve her pain control.  Add ice and heat.  Start ibuprofen or Aleve.  Then reach for narcotic prescription.  Do those 3 things together.  That should help.  Continue the Augmentin antibiotics and complete that course.  I noted if she gets worse or does not improve, she may need blood work a CAT scan or even a visit in the office sooner.  Otherwise keep appointment to see us in a few weeks.  She expressed understanding and appreciation agrees with the plan.  She will call us if things are worsening or not improving in the next few days.  Patient Active Problem List   Diagnosis Date Noted  . Perforated appendicitis 06/01/2014  .  Acute appendicitis 05/31/2014  . Annual physical exam 02/09/2014  . Pap smear for cervical cancer screening 02/09/2014  . NSVD (normal spontaneous vaginal delivery) 03/29/2012  . Mild preeclampsia delivered 03/29/2012  . GBS (group B streptococcus) UTI complicating pregnancy 08/21/2011    Past Medical History  Diagnosis Date  . No pertinent past medical history     Past Surgical History  Procedure Laterality Date  . No past surgeries    . Laparoscopic appendectomy N/A 05/31/2014    Procedure: APPENDECTOMY LAPAROSCOPIC;  Surgeon: Axel FillerArmando Ramirez, MD;  [facility];  Service: General;  Laterality: N/A;    History   Social History  . Marital Status: Married    Spouse Name: N/A    Number of Children: N/A  . Years of Education: N/A   Occupational History  . Not on file.   Social History Main Topics  . Smoking status: Never Smoker   . Smokeless tobacco: Not on file  . Alcohol Use: No  . Drug Use: No  . Sexual Activity: Yes    Birth Control/ Protection: None   Other Topics Concern  . Not on file   Social History Narrative    Family History  Problem Relation Age of Onset  . Alcohol abuse Neg Hx   . Arthritis Neg Hx   . Asthma Neg Hx   . Birth defects Neg Hx   . Cancer Neg Hx   . COPD Neg Hx   . Depression Neg Hx   . Diabetes Neg Hx   .  Drug abuse Neg Hx   . Early death Neg Hx   . Hearing loss Neg Hx   . Heart disease Neg Hx   . Hyperlipidemia Neg Hx   . Learning disabilities Neg Hx   . Kidney disease Neg Hx   . Mental illness Neg Hx   . Mental retardation Neg Hx   . Miscarriages / Stillbirths Neg Hx   . Stroke Neg Hx   . Vision loss Neg Hx   . Hypertension Mother     Current Outpatient Prescriptions  Medication Sig Dispense Refill  . amoxicillin-clavulanate (AUGMENTIN) 875-125 MG per tablet Take 1 tablet by mouth 2 (two) times daily. 20 tablet 0  . oxyCODONE-acetaminophen (PERCOCET/ROXICET) 5-325 MG per tablet Take 1-2 tablets by mouth every 6 (six)  hours as needed for moderate pain. 40 tablet 0   No current facility-administered medications for this visit.     No Known Allergies  @VS @  [place] Transvaginal Non-ob  05/30/2014   CLINICAL DATA:  Pelvic pain for 1 day.  Bilateral.  EXAM: TRANSABDOMINAL AND TRANSVAGINAL ULTRASOUND OF PELVIS  TECHNIQUE: Both transabdominal and transvaginal ultrasound examinations of the pelvis were performed. Transabdominal technique was performed for global imaging of the pelvis including uterus, ovaries, adnexal regions, and pelvic cul-de-sac. It was necessary to proceed with endovaginal exam following the transabdominal exam to visualize the ovaries.  COMPARISON:  None  FINDINGS: Uterus  Measurements: Normal in size at 8.2 x 3.4 x 5.7 cm. No fibroids or other mass visualized.  Endometrium  Thickness: Normal in thickness for premenopausal female at 8 mm. No focal abnormality visualized.  Right ovary  Measurements: Normal in size at 3.6 x 2.6 x 2.6 cm. Normal small follicles.  Left ovary  Measurements: Normal in size at 3.7 x 1.8 x 2.6 cm. Normal small follicles.  Other findings  Small amount free fluid the posterior cul-de-sac  IMPRESSION: 1. Small amount free fluid. 2. Normal uterus and ovaries.   Electronically Signed   By: Genevive BiStewart  Edmunds M.D.   On: 05/30/2014 14:43   [place] Pelvis Complete  05/30/2014   CLINICAL DATA:  Pelvic pain for 1 day.  Bilateral.  EXAM: TRANSABDOMINAL AND TRANSVAGINAL ULTRASOUND OF PELVIS  TECHNIQUE: Both transabdominal and transvaginal ultrasound examinations of the pelvis were performed. Transabdominal technique was performed for global imaging of the pelvis including uterus, ovaries, adnexal regions, and pelvic cul-de-sac. It was necessary to proceed with endovaginal exam following the transabdominal exam to visualize the ovaries.  COMPARISON:  None  FINDINGS: Uterus  Measurements: Normal in size at 8.2 x 3.4 x 5.7 cm. No fibroids or other mass visualized.  Endometrium  Thickness: Normal in  thickness for premenopausal female at 8 mm. No focal abnormality visualized.  Right ovary  Measurements: Normal in size at 3.6 x 2.6 x 2.6 cm. Normal small follicles.  Left ovary  Measurements: Normal in size at 3.7 x 1.8 x 2.6 cm. Normal small follicles.  Other findings  Small amount free fluid the posterior cul-de-sac  IMPRESSION: 1. Small amount free fluid. 2. Normal uterus and ovaries.   Electronically Signed   By: Genevive BiStewart  Edmunds M.D.   On: 05/30/2014 14:43   Ct Abdomen Pelvis W Contrast  05/31/2014   CLINICAL DATA:  Lower abdominal and pelvic pain  EXAM: CT ABDOMEN AND PELVIS WITH CONTRAST  TECHNIQUE: Multidetector CT imaging of the abdomen and pelvis was performed using the standard protocol following bolus administration of intravenous contrast.  CONTRAST:  100mL OMNIPAQUE IOHEXOL 300 MG/ML  SOLN  COMPARISON:  None.  FINDINGS: Lung bases are clear.  There are bilateral breast implants.  No focal liver lesions are identified. Gallbladder wall is not appreciably thickened. There is no biliary duct dilatation.  Spleen, pancreas, and adrenals appear normal. There is a focal area of decreased attenuation in the anterior left mid kidney measuring 1.5 x 0.8 cm which has attenuation values higher than is expected with a simple cyst. There is no other renal mass. There is no hydronephrosis on either side. No renal or ureteral calculus is identified on either side.  In the pelvis, there is mild thickening of the urinary bladder wall. There is moderate free fluid in the cul-de-sac. The endometrium appears rather prominent.  There are two large appendicolith in the appendix. The appendix is diffusely enlarged with surrounding mesenteric thickening and surrounding fluid consistent with appendicitis. Inflammation surrounds the right ovary. There is a probable follicle in the right ovary which is distorted by the surrounding inflammation.  There is some wall thickening in the lateral cecum, felt to be due to the  adjacent appendicitis. There is no frank bowel obstruction. No free air or portal venous air.  There is no appreciable adenopathy in the abdomen or pelvis. A well-defined abscess is not seen.  Aorta shows no aneurysm. There are no blastic or lytic bone lesions.  IMPRESSION: Evidence of acute appendicitis. There may well be appendiceal rupture. Fluid surrounds the right ovary. Fluid also tracks around the uterus into the cul-de-sac. Mild wall thickening in the urinary bladder may be secondary to fluid from the appendicitis. No frank bowel obstruction.  There is a mass in the medial mid left kidney measuring 1.5 x 0.8 cm which cannot be classified as a cyst. This mass warrants nonemergent further surveillance. Further evaluation with pre and post contrast MRI or CT should be considered. MRI is preferred in younger patients (due to lack of ionizing radiation) and for evaluating calcified lesion(s).  Critical Value/emergent results were called by telephone at the time of interpretation on 05/31/2014 at 7:13 pm to Dr. Rolland Porter , who verbally acknowledged these results.   Electronically Signed   By: Bretta Bang M.D.   On: 05/31/2014 19:15    Note: This dictation was prepared with Dragon/digital dictation along with [employer]. Any transcriptional errors that result from this process are unintentional.

## 2014-07-28 ENCOUNTER — Ambulatory Visit: Payer: Self-pay | Attending: Internal Medicine

## 2015-01-23 ENCOUNTER — Emergency Department (HOSPITAL_COMMUNITY)
Admission: EM | Admit: 2015-01-23 | Discharge: 2015-01-24 | Disposition: A | Discharge status: Home / Self Care | Payer: No Typology Code available for payment source | Attending: Emergency Medicine | Admitting: Emergency Medicine

## 2015-01-23 ENCOUNTER — Encounter (HOSPITAL_COMMUNITY): Payer: Self-pay | Admitting: Emergency Medicine

## 2015-01-23 ENCOUNTER — Emergency Department (HOSPITAL_COMMUNITY): Payer: No Typology Code available for payment source

## 2015-01-23 DIAGNOSIS — Y9389 Activity, other specified: Secondary | ICD-10-CM | POA: Insufficient documentation

## 2015-01-23 DIAGNOSIS — S99921A Unspecified injury of right foot, initial encounter: Secondary | ICD-10-CM

## 2015-01-23 DIAGNOSIS — S40011A Contusion of right shoulder, initial encounter: Secondary | ICD-10-CM | POA: Diagnosis not present

## 2015-01-23 DIAGNOSIS — Z792 Long term (current) use of antibiotics: Secondary | ICD-10-CM | POA: Diagnosis not present

## 2015-01-23 DIAGNOSIS — S8002XA Contusion of left knee, initial encounter: Secondary | ICD-10-CM | POA: Diagnosis not present

## 2015-01-23 DIAGNOSIS — S9031XA Contusion of right foot, initial encounter: Secondary | ICD-10-CM | POA: Insufficient documentation

## 2015-01-23 DIAGNOSIS — Y92410 Unspecified street and highway as the place of occurrence of the external cause: Secondary | ICD-10-CM | POA: Diagnosis not present

## 2015-01-23 DIAGNOSIS — S60211A Contusion of right wrist, initial encounter: Secondary | ICD-10-CM

## 2015-01-23 DIAGNOSIS — Y999 Unspecified external cause status: Secondary | ICD-10-CM | POA: Diagnosis not present

## 2015-01-23 MED ORDER — ACETAMINOPHEN 500 MG PO TABS
1000.0000 mg | ORAL_TABLET | Freq: Once | ORAL | Status: AC
  Administered 2015-01-23: 1000 mg via ORAL
  Filled 2015-01-23: qty 2
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
## 2015-01-23 NOTE — ED Notes (Signed)
Patient transported to X-ray 
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
## 2015-01-23 NOTE — ED Notes (Addendum)
Transported patient and son by WC to [facility].  Patient urinated while in [facility].

## 2015-01-24 DIAGNOSIS — S9031XA Contusion of right foot, initial encounter: Secondary | ICD-10-CM | POA: Diagnosis not present

## 2015-01-24 MED ORDER — HYDROCODONE-ACETAMINOPHEN 5-325 MG PO TABS: 1.0000 | ORAL_TABLET | Freq: Four times a day (QID) | ORAL | Status: DC | PRN

## 2015-01-24 NOTE — Progress Notes (Signed)
Orthopedic Tech Progress Note Patient Details:  Bridget Huffman 10/19/1985 [id number]  Ortho Devices Type of Ortho Device: Crutches, Post (short leg) splint Ortho Device/Splint Location: RLE Ortho Device/Splint Interventions: Application   Asia R Thompson 01/24/2015, 12:47 AM
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
## 2015-01-25 ENCOUNTER — Emergency Department (HOSPITAL_COMMUNITY)
Admission: EM | Admit: 2015-01-25 | Discharge: 2015-01-25 | Disposition: A | Discharge status: Home / Self Care | Payer: No Typology Code available for payment source | Attending: Emergency Medicine | Admitting: Emergency Medicine

## 2015-01-25 ENCOUNTER — Encounter (HOSPITAL_COMMUNITY): Payer: Self-pay | Admitting: *Deleted

## 2015-01-25 DIAGNOSIS — M62838 Other muscle spasm: Secondary | ICD-10-CM | POA: Insufficient documentation

## 2015-01-25 DIAGNOSIS — Y999 Unspecified external cause status: Secondary | ICD-10-CM | POA: Insufficient documentation

## 2015-01-25 DIAGNOSIS — S46911A Strain of unspecified muscle, fascia and tendon at shoulder and upper arm level, right arm, initial encounter: Secondary | ICD-10-CM | POA: Insufficient documentation

## 2015-01-25 DIAGNOSIS — Y92410 Unspecified street and highway as the place of occurrence of the external cause: Secondary | ICD-10-CM | POA: Insufficient documentation

## 2015-01-25 DIAGNOSIS — S46811A Strain of other muscles, fascia and tendons at shoulder and upper arm level, right arm, initial encounter: Secondary | ICD-10-CM

## 2015-01-25 DIAGNOSIS — Y9389 Activity, other specified: Secondary | ICD-10-CM | POA: Insufficient documentation

## 2015-01-25 DIAGNOSIS — M542 Cervicalgia: Secondary | ICD-10-CM

## 2015-01-25 DIAGNOSIS — R079 Chest pain, unspecified: Secondary | ICD-10-CM | POA: Insufficient documentation

## 2015-01-25 DIAGNOSIS — G44319 Acute post-traumatic headache, not intractable: Secondary | ICD-10-CM

## 2015-01-25 DIAGNOSIS — S060X0D Concussion without loss of consciousness, subsequent encounter: Secondary | ICD-10-CM | POA: Insufficient documentation

## 2015-01-25 MED ORDER — DEXAMETHASONE SODIUM PHOSPHATE 10 MG/ML IJ SOLN
10.0000 mg | Freq: Once | INTRAMUSCULAR | Status: AC
  Administered 2015-01-25: 10 mg via INTRAMUSCULAR
  Filled 2015-01-25: qty 1

## 2015-01-25 MED ORDER — HYDROCODONE-ACETAMINOPHEN 5-325 MG PO TABS
1.0000 | ORAL_TABLET | Freq: Once | ORAL | Status: AC
  Administered 2015-01-25: 1 via ORAL
  Filled 2015-01-25 (×2): qty 1

## 2015-01-25 MED ORDER — NAPROXEN 500 MG PO TABS: 500.0000 mg | ORAL_TABLET | Freq: Two times a day (BID) | ORAL | Status: DC | PRN
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
## 2015-03-08 ENCOUNTER — Encounter: Payer: Self-pay | Admitting: Physical Therapy

## 2015-03-08 ENCOUNTER — Ambulatory Visit: Payer: Medicaid Other | Attending: Orthopedic Surgery | Admitting: Physical Therapy

## 2015-03-08 DIAGNOSIS — M79671 Pain in right foot: Secondary | ICD-10-CM

## 2015-03-08 DIAGNOSIS — R609 Edema, unspecified: Secondary | ICD-10-CM

## 2015-03-08 DIAGNOSIS — R269 Unspecified abnormalities of gait and mobility: Secondary | ICD-10-CM | POA: Insufficient documentation

## 2015-03-08 DIAGNOSIS — R6889 Other general symptoms and signs: Secondary | ICD-10-CM | POA: Insufficient documentation

## 2015-03-08 NOTE — Patient Instructions (Signed)
Gastroc / Heel Cord Stretch - Seated With Towel   Sit on floor, towel around ball of foot. Gently pull foot in toward body, stretching heel cord and calf. Hold for _30__ seconds. Repeat on involved leg. Repeat __2-3 _ times. Do _2-3__ times per day.  Copyright  VHI. All rights reserved.   Ankle Inversion / Eversion, Sitting   Sit and grasp bottom of one foot. Bend ankle by moving foot inward and outward. Hold each position _15-20__ seconds. Repeat 2-3_ times per session. Do 2-3_ sessions per day.  Copyright  VHI. All rights reserved.   Ankle Sideward Movement (Inversion / Eversion)   Sitting or lying with feet on floor, keep knee still and rock foot onto outer edge. Return to resting position. Now rock foot onto inner edge. Right foot Repeat __10__ times. Do 2-3____ sessions per day.  http://[email or web address].exer.us/405      Forefoot Evertors   Place right foot flat on towel, knee pointed forward. Use forefoot and toes to push towel out to side. Do not allow heel or knee to move. Hold __3__ seconds. Repeat _5___ times. Do _1-2___ sessions per day. CAUTION: Repetitions should be slow and controlled.  Forefoot Invertors   Place right foot flat on towel, knee pointed forward. Use forefoot and toes to pull towel in toward center. Do not allow heel or knee to move. Hold __3-5__ seconds. Repeat ___10_ times. Do _1-2___ sessions per day. CAUTION: Repetitions should be slow and controlled.   TOES: Towel Bunching   With involved straight toes on towel, bend toes bunching up towel __5_ times. Do _1-2__ times per day.  Copyright  VHI. All rights reserved.   Cryotherapy Cryotherapy means treatment with cold. Ice or gel packs can be used to reduce both pain and swelling. Ice is the most helpful within the first 24 to 48 hours after an injury or flare-up from overusing a muscle or joint. Sprains, strains, spasms, burning pain, shooting pain, and aches can all be eased with ice. Ice can  also be used when recovering from surgery. Ice is effective, has very few side effects, and is safe for most people to use. PRECAUTIONS  Ice is not a safe treatment option for people with:  Raynaud phenomenon. This is a condition affecting small blood vessels in the extremities. Exposure to cold may cause your problems to return.  Cold hypersensitivity. There are many forms of cold hypersensitivity, including:  Cold urticaria. Red, itchy hives appear on the skin when the tissues begin to warm after being iced.  Cold erythema. This is a red, itchy rash caused by exposure to cold.  Cold hemoglobinuria. Red blood cells break down when the tissues begin to warm after being iced. The hemoglobin that carry oxygen are passed into the urine because they cannot combine with blood proteins fast enough.  Numbness or altered sensitivity in the area being iced. If you have any of the following conditions, do not use ice until you have discussed cryotherapy with your caregiver:  Heart conditions, such as arrhythmia, angina, or chronic heart disease.  High blood pressure.  Healing wounds or open skin in the area being iced.  Current infections.  Rheumatoid arthritis.  Poor circulation.  Diabetes. Ice slows the blood flow in the region it is applied. This is beneficial when trying to stop inflamed tissues from spreading irritating chemicals to surrounding tissues. However, if you expose your skin to cold temperatures for too long or without the proper protection, you can damage your skin  or nerves. Watch for signs of skin damage due to cold. HOME CARE INSTRUCTIONS Follow these tips to use ice and cold packs safely.  Place a dry or damp towel between the ice and skin. A damp towel will cool the skin more quickly, so you may need to shorten the time that the ice is used.  For a more rapid response, add gentle compression to the ice.  Ice for no more than 10 to 20 minutes at a time. The bonier the  area you are icing, the less time it will take to get the benefits of ice.  Check your skin after 5 minutes to make sure there are no signs of a poor response to cold or skin damage.  Rest 20 minutes or more between uses.  Once your skin is numb, you can end your treatment. You can test numbness by very lightly touching your skin. The touch should be so light that you do not see the skin dimple from the pressure of your fingertip. When using ice, most people will feel these normal sensations in this order: cold, burning, aching, and numbness.  Do not use ice on someone who cannot communicate their responses to pain, such as small children or people with dementia. HOW TO MAKE AN ICE PACK Ice packs are the most common way to use ice therapy. Other methods include ice massage, ice baths, and cryosprays. Muscle creams that cause a cold, tingly feeling do not offer the same benefits that ice offers and should not be used as a substitute unless recommended by your caregiver. To make an ice pack, do one of the following:  Place crushed ice or a bag of frozen vegetables in a sealable plastic bag. Squeeze out the excess air. Place this bag inside another plastic bag. Slide the bag into a pillowcase or place a damp towel between your skin and the bag.  Mix 3 parts water with 1 part rubbing alcohol. Freeze the mixture in a sealable plastic bag. When you remove the mixture from the freezer, it will be slushy. Squeeze out the excess air. Place this bag inside another plastic bag. Slide the bag into a pillowcase or place a damp towel between your skin and the bag. SEEK MEDICAL CARE IF:  You develop white spots on your skin. This may give the skin a blotchy (mottled) appearance.  Your skin turns blue or pale.  Your skin becomes waxy or hard.  Your swelling gets worse. MAKE SURE YOU:   Understand these instructions.  Will watch your condition.  Will get help right away if you are not doing well or get  worse. Document Released: 01/22/2011 Document Revised: 10/12/2013 Document Reviewed: 01/22/2011 [facility] Patient Information 2015 [place], [place]. This information is not intended to replace advice given to you by your health care provider. Make sure you discuss any questions you have with your health care provider.  With good tennis shoes. Holding onto counter , [place] [employer],  Do weight shift side to side and forward x15 2-3 times a day to get used to weight bearing through foot.  As shown in clinic.  Don't wear flip flops.    Garen Lah, PT 03/08/2015 9:10 AM Phone: [phone] Fax: [phone]
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
## 2015-03-17 ENCOUNTER — Ambulatory Visit: Payer: Self-pay | Attending: Orthopedic Surgery | Admitting: Physical Therapy

## 2015-03-17 DIAGNOSIS — R6889 Other general symptoms and signs: Secondary | ICD-10-CM | POA: Insufficient documentation

## 2015-03-17 DIAGNOSIS — R269 Unspecified abnormalities of gait and mobility: Secondary | ICD-10-CM | POA: Insufficient documentation

## 2015-03-17 DIAGNOSIS — R600 Localized edema: Secondary | ICD-10-CM | POA: Insufficient documentation

## 2015-03-17 DIAGNOSIS — M79671 Pain in right foot: Secondary | ICD-10-CM | POA: Insufficient documentation

## 2015-03-17 NOTE — Patient Instructions (Addendum)
   Heel Raise: Bilateral (Standing)   Rise on balls of feet. Repeat __10__ times per set. Do __2__ sets per session. Do __2__ sessions per day.        DORSIFLEXION STRENGTHENING:  Toe Raise (Standing)   Rock back on heels. Repeat __10__ times per set. Do _2___ sets per session. Do ___2_ sessions per day.     SINGLE LIMB STANCE   Stance: single leg on floor. Raise leg. Hold _30__ seconds. Repeat with other leg. __2-3_ reps per set, __2_ sets per day, _7__ days per week  Copyright  VHI. All rights reserved.   Knee High   Balance on right foot.  Raise left  knee to hip level, then lower knee.  Complete __10_ repetitions. Do __2__ sessions per day.  ABDUCTION: Standing (Active)   Balance on right and Lift left leg out to side. Use _0__ lbs. Complete __10_ repetitions. Perform __2_ sessions per day.        EXTENSION: Standing (Active)  Balance on right leg. Draw left leg behind body as far as possible. Use 0___ lbs. Complete 10 repetitions. Perform __2_ sessions per day.  Copyright  VHI. All rights reserved.      Achilles / Gastroc, Standing   Stand, right foot behind, heel on floor and turned slightly out, leg straight, forward leg bent. Move hips forward. Hold _30__ seconds. Repeat _3__ times per session. Do _3__ sessions per day.   Stretching: Soleus   Stand with right foot back, both knees bent. Keeping heel on floor, turned slightly out, lean into wall until stretch is felt in lower calf. Hold _30___ seconds. Repeat __3__ times per set. Do __3__ sessions per day.  http://orth.exer.us/665

## 2015-03-17 NOTE — Therapy (Signed)
[facility] Outpatient Rehabilitation [facility] [address] [place], [place], [address] Phone: [phone]   Fax:  [phone]  Physical Therapy Treatment  Patient Details  Name: Bridget Huffman MRN: [id number] Date of Birth: Nov 09, 1985 Referring Provider:  Lunette Stands, MD  Encounter Date: 03/17/2015      PT End of Session - 03/17/15 1023    Visit Number 2   Number of Visits 8   Date for PT Re-Evaluation 05/03/15   Authorization Type Medicaid/Med pay   PT Start Time 1017   PT Stop Time 1111   PT Time Calculation (min) 54 min      Past Medical History  Diagnosis Date  . No pertinent past medical history     Past Surgical History  Procedure Laterality Date  . No past surgeries    . Laparoscopic appendectomy N/A 05/31/2014    Procedure: APPENDECTOMY LAPAROSCOPIC;  Surgeon: Axel Filler, MD;  Location: [facility] OR;  Service: General;  Laterality: N/A;    There were no vitals filed for this visit.  Visit Diagnosis:  Right foot pain  Localized edema  Decreased activity  Abnormality of gait  Activity intolerance      Subjective Assessment - 03/17/15 1025    Subjective No more pain in the cam boot. Only pain when I walk  without the Cam boot.    Currently in Pain? No/denies   Pain Location Foot   Pain Orientation Right   Aggravating Factors  walking without the boot   Pain Relieving Factors ice                         [facility] Adult PT Treatment/Exercise - 03/17/15 0001    Ankle Exercises: Seated   BAPS Level 2  circles each way   Ankle Exercises: Standing   SLS 30   Heel Raises 10 reps   Toe Raise 10 reps   Other Standing Ankle Exercises side stepping, backward walking, tandem walking SLS on right with left   vectors x 10 each   Ankle Exercises: Stretches   Soleus Stretch 3 reps;30 seconds   Gastroc Stretch 3 reps;30 seconds                PT Education - 03/17/15 1104    Education provided Yes   Education  Details Heel raises, toe raises, SLS, 3 way hip during SLS, gastroc/soleus stretch   Person(s) Educated Patient   Methods Explanation;Handout   Comprehension Verbalized understanding          PT Short Term Goals - 03/08/15 1213    PT SHORT TERM GOAL #1   Title STG=LTG           PT Long Term Goals - 03/08/15 0919    PT LONG TERM GOAL #1   Title Pt will be educated on advanced HEP for neuromuscular/strengthening of LE for return to work PLOF   Baseline Pt with no knowledge of    Time 8   Period Weeks   Status New   PT LONG TERM GOAL #2   Title Pt will be able to negotiate steps with 2/10 pain or less   Baseline Pt unable to negotiate steps without crutches and with any wt bearing, using crutches   Time 8   Period Weeks   Status New   PT LONG TERM GOAL #3   Title "Pt will tolerate standing and walking for 2 hours without increased pain in order to return to PLOF and  work.    Baseline Pt unable to tolerate standing for longer than 2 minutes without pain   Time 8   Period Weeks   Status New   PT LONG TERM GOAL #4   Title LEFS will improve from  23/80 (77%limiation)  to  55/80 (31% limitation)   indicating improved functional mobility.        Baseline Pt LEFS initial at 77% limitation   Time 8   Period Weeks   Status New   PT LONG TERM GOAL #5   Title Pt will be able to ambulate without CAM walking boot or assistive device with secgait velocity of 2.62 ft.   Baseline Pt dependent on CAM walking boot and crutches only tolerating 3 min with standing before pain of 7/10   Time 8   Period Weeks   Status New   Additional Long Term Goals   Additional Long Term Goals Yes   PT LONG TERM GOAL #6   Title Pt will be able to single leg heel raise on Right 20/20 times to show improved strength in Right LE in order to ambulate over unlevel surfaces and stairs   Baseline Pt unable to raise heels on Right.   Time 8   Period Weeks   Status New               Plan - 03/17/15  1335    Clinical Impression Statement Pt enters with Cam boot. She has no more pain while wearing boot. She has tried some walking without the boot however it causes 4/10 pain. Instructed pt in standing ankle axercises for HEP progression with pt reporting only minimal pain increase. Gait training with focus on proper heel strike and toe off as well as equalizing steps to decrease antalgic gait. Trial of Vasopneumatic at end of session however the machine did not have ice, so no charge this visit.    PT Next Visit Plan Try vaso again, assess wean off Cam boot progression. Check standing HEP, progress as tolerated        Problem List Patient Active Problem List   Diagnosis Date Noted  . Perforated appendicitis 06/01/2014  . Acute appendicitis 05/31/2014  . Annual physical exam 02/09/2014  . Pap smear for cervical cancer screening 02/09/2014  . NSVD (normal spontaneous vaginal delivery) 03/29/2012  . Mild preeclampsia delivered 03/29/2012  . GBS (group B streptococcus) UTI complicating pregnancy 08/21/2011    Sherrie Mustache, PTA 03/17/2015, 1:51 PM  [facility] [address] [place], [place], [address] Phone: [phone]   Fax:  [phone]

## 2015-03-22 ENCOUNTER — Ambulatory Visit: Payer: Self-pay | Admitting: Physical Therapy

## 2015-03-22 DIAGNOSIS — M79671 Pain in right foot: Secondary | ICD-10-CM

## 2015-03-22 DIAGNOSIS — R269 Unspecified abnormalities of gait and mobility: Secondary | ICD-10-CM

## 2015-03-22 DIAGNOSIS — R600 Localized edema: Secondary | ICD-10-CM

## 2015-03-22 DIAGNOSIS — R6889 Other general symptoms and signs: Secondary | ICD-10-CM

## 2015-03-22 NOTE — Therapy (Signed)
[facility] Outpatient Rehabilitation [facility] [address] [place], [place], [address] Phone: [phone]   Fax:  [phone]  Physical Therapy Treatment  Patient Details  Name: Bridget Huffman MRN: [id number] Date of Birth: 22-Jan-1986 Referring Provider:  Quentin Angst, MD  Encounter Date: 03/22/2015      PT End of Session - 03/22/15 0849    Visit Number 3   Number of Visits 8   Date for PT Re-Evaluation 05/03/15   Authorization Type Medicaid/Med pay   PT Start Time 0845   PT Stop Time 0938   PT Time Calculation (min) 53 min      Past Medical History  Diagnosis Date  . No pertinent past medical history     Past Surgical History  Procedure Laterality Date  . No past surgeries    . Laparoscopic appendectomy N/A 05/31/2014    Procedure: APPENDECTOMY LAPAROSCOPIC;  Surgeon: Axel Filler, MD;  Location: [facility] OR;  Service: General;  Laterality: N/A;    There were no vitals filed for this visit.  Visit Diagnosis:  Right foot pain  Decreased activity  Activity intolerance  Localized edema  Abnormality of gait      Subjective Assessment - 03/22/15 0849    Subjective No pain in Cam boot. 2/10 pain walking in shoe. I have been working without the boot and then putting it back on with walking alot at home or community.    Currently in Pain? Yes   Pain Score 2    Pain Location Foot   Pain Orientation Right   Pain Descriptors / Indicators Sore   Aggravating Factors  walking without the boot   Pain Relieving Factors ice                         [facility] Adult PT Treatment/Exercise - 03/22/15 0902    Modalities   Modalities Vasopneumatic   Vasopneumatic   Number Minutes Vasopneumatic  15 minutes   Vasopnuematic Location  Ankle  right   Vasopneumatic Pressure Medium   Vasopneumatic Temperature  32  reduced to 2 snowflakes for comfort   Ankle Exercises: Standing   Rebounder with and without foam trials with right SLS    Heel Raises 10 reps  also on edge of step   Toe Raise 10 reps   Heel Walk (Round Trip) 30 ft   Toe Walk (Round Trip) 30 ft   Other Standing Ankle Exercises step ups and retro step ups, step downs with pain on step down and retro step up rated at 3/10   Other Standing Ankle Exercises Concentric heel raises with right single lowering.    Ankle Exercises: Stretches   Soleus Stretch 3 reps;30 seconds   Gastroc Stretch 3 reps;30 seconds  on step   Ankle Exercises: Machines for Strengthening   Cybex Leg Press 2, 3 plates x 10 each , 1 plate for heel raises x 15 -fatigue   Ankle Exercises: Aerobic   Stationary Bike Rec bike l2 x 5 min   Tread Mill 2.0 mph x 5 min                PT Education - 03/22/15 0928    Education Details Toe walking, heel raises eccentric lowering, heel raises on step    Person(s) Educated Patient   Methods Explanation;Handout   Comprehension Verbalized understanding          PT Short Term Goals - 03/08/15 1213    PT SHORT TERM  GOAL #1   Title STG=LTG           PT Long Term Goals - 03/22/15 0851    PT LONG TERM GOAL #1   Title Pt will be educated on advanced HEP for neuromuscular/strengthening of LE for return to work PLOF   Time 8   Period Weeks   Status On-going   PT LONG TERM GOAL #2   Title Pt will be able to negotiate steps with 2/10 pain or less   Time 8   Period Weeks   Status On-going   PT LONG TERM GOAL #3   Title "Pt will tolerate standing and walking for 2 hours without increased pain in order to return to PLOF and work.    Time 8   Period Weeks   Status On-going   PT LONG TERM GOAL #4   Title LEFS will improve from  23/80 (77%limiation)  to  55/80 (31% limitation)   indicating improved functional mobility.        Time 8   Period Weeks   Status On-going   PT LONG TERM GOAL #5   Title Pt will be able to ambulate without CAM walking boot or assistive device with secgait velocity of 2.62 ft.   Time 8   Period Weeks    Status On-going   PT LONG TERM GOAL #6   Title Pt will be able to single leg heel raise on Right 20/20 times to show improved strength in Right LE in order to ambulate over unlevel surfaces and stairs   Time 8   Period Weeks   Status On-going               Plan - 03/22/15 0981    Clinical Impression Statement Pt enters without cam boot and rates pain at 2/10. Instructed pt in stair climbing with 3/10 on descending stairs. She is progressing toward all goals. Instructed pt in toe walking and heel walking for HEP. Pt cannot perform full single heel raise on right.   PT Next Visit Plan vaso/ice as needed, assess wean off Cam boot progression. Check standing HEP, progress as tolerated; elliptical/TM        Problem List Patient Active Problem List   Diagnosis Date Noted  . Perforated appendicitis 06/01/2014  . Acute appendicitis 05/31/2014  . Annual physical exam 02/09/2014  . Pap smear for cervical cancer screening 02/09/2014  . NSVD (normal spontaneous vaginal delivery) 03/29/2012  . Mild preeclampsia delivered 03/29/2012  . GBS (group B streptococcus) UTI complicating pregnancy 08/21/2011    Sherrie Mustache, PTA 03/22/2015, 10:17 AM  [facility] [address] [place], [place], [address] Phone: [phone]   Fax:  [phone]

## 2015-03-22 NOTE — Patient Instructions (Signed)
Heel Raises    Stand with support.  With knees straight, raise heels off ground. Shift weight to right foot and lower slowly Repeat __10_ times. Do _2__ times a day.  Copyright  VHI. All rights reserved.    Heel Raise: Standing   Toes on board, heels on floor, knees slightly bent, rise up on toes as high as possible. Do __2__ sets. Complete _10___ repetitions.  http://st.exer.us/132      FUNCTIONAL MOBILITY: Toe Walking   Walk forward on toes. _20__ feet per set, _2__ sets per day, __7_ days per week    DORSIFLEXION STRENGTHENING:    FUNCTIONAL MOBILITY: Heel Walking   Walk forward on heels. _20__ feet per set, _2__ sets per day, _7__ days per week

## 2015-03-29 ENCOUNTER — Ambulatory Visit: Payer: Self-pay | Admitting: Physical Therapy

## 2015-04-05 ENCOUNTER — Ambulatory Visit: Payer: Self-pay | Admitting: Physical Therapy

## 2015-04-05 DIAGNOSIS — M79671 Pain in right foot: Secondary | ICD-10-CM

## 2015-04-05 DIAGNOSIS — R600 Localized edema: Secondary | ICD-10-CM

## 2015-04-05 DIAGNOSIS — R6889 Other general symptoms and signs: Secondary | ICD-10-CM

## 2015-04-05 DIAGNOSIS — R269 Unspecified abnormalities of gait and mobility: Secondary | ICD-10-CM

## 2015-04-05 NOTE — Patient Instructions (Signed)
Abduction: Clam (Eccentric) - Side-Lying   Lie on side with knees bent. Lift top knee, keeping feet together. Keep trunk steady. Slowly lower for 3-5 seconds. 10___ reps per set, __3_ sets per day, _7__ days per week. Use Red theraband and progress to green theraband for hip strength.  Copyright  VHI. All rights reserved.   Garen LahLawrie Beardsley, PT 04/05/2015 8:51 AM Phone: [phone] Fax: [phone]

## 2015-04-05 NOTE — Therapy (Signed)
[place], [place], [address] Phone: [phone]   Fax:  [phone]  Physical Therapy Treatment/Discharge Note  Patient Details  Name: Bridget Huffman MRN: [id number] Date of Birth: 11-Feb-1986 No Data Recorded  Encounter Date: 04/05/2015      PT End of Session - 04/05/15 0831    Visit Number 4   Number of Visits 8   Date for PT Re-Evaluation 05/03/15   Authorization Type Medicaid/Med pay   PT Start Time 0830   PT Stop Time 0930   PT Time Calculation (min) 60 min      Past Medical History  Diagnosis Date  . No pertinent past medical history     Past Surgical History  Procedure Laterality Date  . No past surgeries    . Laparoscopic appendectomy N/A 05/31/2014    Procedure: APPENDECTOMY LAPAROSCOPIC;  Surgeon: Ralene Ok, MD;  Location: [facility];  Service: General;  Laterality: N/A;    There were no vitals filed for this visit.  Visit Diagnosis:  Right foot pain  Decreased activity  Activity intolerance  Localized edema  Abnormality of gait      Subjective Assessment - 04/05/15 0833    Subjective No pain and walking in tennis shoe   Patient Stated Goals To not use crutches or walking boot   Currently in Pain? No/denies   Pain Score 0-No pain  occassionally a 1/10   Pain Location Foot   Pain Orientation Right   Pain Descriptors / Indicators Sore   Pain Type Chronic pain   Pain Onset More than a month ago   Pain Frequency Intermittent            [facility] PT Assessment - 04/05/15 0836    Observation/Other Assessments   Lower Extremity Functional Scale  67/80 16% limitation   AROM   Right Ankle Dorsiflexion 9   Right Ankle Plantar Flexion 56   Right Ankle Inversion 23   Right Ankle Eversion 40   Left Ankle Dorsiflexion 23   Left Ankle Plantar Flexion 60   Left Ankle Inversion 40   Left Ankle Eversion 23   Strength   Overall Strength Within functional limits for  tasks performed                     [facility] Adult PT Treatment/Exercise - 04/05/15 0836    Ambulation/Gait   Ambulation/Gait Yes   Gait velocity 5.06 ft/sec   Gait Comments normal gait   Knee/Hip Exercises: Sidelying   Clams right and left 3 x 10 with red t band and showed how to progress to green t band    Modalities   Modalities Vasopneumatic   Vasopneumatic   Number Minutes Vasopneumatic  15 minutes   Vasopnuematic Location  Ankle  right   Vasopneumatic Pressure Medium   Vasopneumatic Temperature  32  reduced to 2 snowflakes for comfort   Ankle Exercises: Standing   Rebounder with and without foam trials with right SLS   Heel Raises 10 reps  also on edge of step   Toe Raise 10 reps   Heel Walk (Round Trip) 40 ft   Toe Walk (Round Trip) 40 ft   Other Standing Ankle Exercises step ups and retro step ups, step downs with pain on step down and retro step up rated at 3/10   Other Standing Ankle Exercises Concentric heel raises with right single lowering.    Ankle Exercises: Stretches   Soleus Stretch 3  reps;30 seconds   Gastroc Stretch 3 reps;30 seconds  on step   Ankle Exercises: Machines for Strengthening   Cybex Leg Press bil, 3 plates x 10 each , 2 plate for heel raises x 10   Ankle Exercises: Aerobic   Stationary Bike --   Elliptical 522 strides 5 min   Altamese [place] --                PT Education - 04/05/15 [phone]    Education provided Yes   Education Details reveiwed HEP for ankle /hip strength, return to exercise on machines/walking/ discussion of shoes to wear for exercise/work   Person(s) Educated Patient   Methods Explanation;Demonstration;Handout;Verbal cues   Comprehension Verbalized understanding;Returned demonstration          PT Short Term Goals - 03/08/15 1213    PT SHORT TERM GOAL #1   Title STG=LTG           PT Long Term Goals - 04/05/15 0900    PT LONG TERM GOAL #1   Title Pt will be educated on advanced HEP for  neuromuscular/strengthening of LE for return to work PLOF   Baseline Pt with no knowledge of / now independent   Time 8   Period Weeks   Status Achieved   PT LONG TERM GOAL #2   Title Pt will be able to negotiate steps with 2/10 pain or less   Baseline 0/10 today after exercise   Time 8   Period Weeks   Status Achieved   PT LONG TERM GOAL #3   Title "Pt will tolerate standing and walking for 2 hours without increased pain in order to return to PLOF and work.    Baseline Pt able to work 8  hours at work up and down from desk.   Time 8   Period Weeks   Status Achieved   PT LONG TERM GOAL #4   Title LEFS will improve from  23/80 (77%limiation)  to  55/80 (31% limitation)   indicating improved functional mobility.        Baseline  67/80  16% limitation    PT LONG TERM GOAL #5   Title Pt will be able to ambulate without CAM walking boot or assistive device with secgait velocity of 2.62 ft.   Baseline Pt walking in tennis shoes and no assitive device at 5.06 ft/sec   Time 8   Period Weeks   Status Achieved   PT LONG TERM GOAL #6   Title Pt will be able to single leg heel raise on Right 20/20 times to show improved strength in Right LE in order to ambulate over unlevel surfaces and stairs   Baseline Pt able to SLS and heel raise on Right and then left 25/25 times   Time 8   Period Weeks   Status Achieved               Plan - 04/05/15 0909    Clinical Impression Statement Pt walkiing normally into clinc with no pain in Right foot today.  Pt a little sore  1/10 after challenging exercise so put on Vasopneumatice for post work out exercise and comfort.  Pain 0/10 after treatment.  Pt with WNL AROM of Right ankle.  Pt completed all LTG and is ready for discharge.  Pt is able to walk 5.06 ft/sec with normal gait speed.  Pt also improved LEFS to 16% limitation. (predicted 31%.) See goals   All completed. and pt is independent  with HEP and progression   PT Next Visit Plan Discharge due  to completed goals.   Consulted and Agree with Plan of Care Patient        Problem List Patient Active Problem List   Diagnosis Date Noted  . Perforated appendicitis 06/01/2014  . Acute appendicitis 05/31/2014  . Annual physical exam 02/09/2014  . Pap smear for cervical cancer screening 02/09/2014  . NSVD (normal spontaneous vaginal delivery) 03/29/2012  . Mild preeclampsia delivered 03/29/2012  . GBS (group B streptococcus) UTI complicating pregnancy 32/54/9826    Voncille Lo, PT 04/05/2015 9:20 AM Phone: [phone] Fax: [facility], [place], [address] Phone: [phone]   Fax:  [phone]  Name: Margaretha Mahan MRN: [id number] Date of Birth: [birth year]   PHYSICAL THERAPY DISCHARGE SUMMARY  Visits from Start of Care: 4  Current functional level related to goals / functional outcomes: See goals above   Remaining deficits: none   Education / Equipment: HEP and red tband and instruction on progression   Plan: Patient agrees to discharge.  Patient goals were met. Patient is being discharged due to meeting the stated rehab goals.  ?????and being pleased with current  function        Voncille Lo, PT 04/05/2015 9:20 AM Phone: [phone] Fax: [phone]

## 2015-04-12 ENCOUNTER — Encounter: Payer: Medicaid Other | Admitting: Physical Therapy

## 2015-08-30 IMAGING — US US TRANSVAGINAL NON-OB
1 series · 14 of 25 positions shown · non-contrast
Comparison: None

CLINICAL DATA: Pelvic pain for 1 day.  Bilateral.



[Series 1: us transvaginal non-ob · 0.20mm/px · 56 acquisitions, 14 frames shown]
[im 1/56]
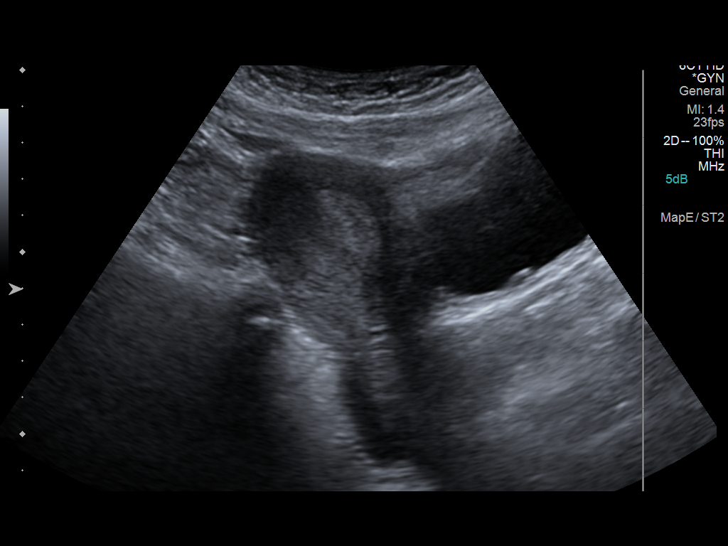
[im 5/56]
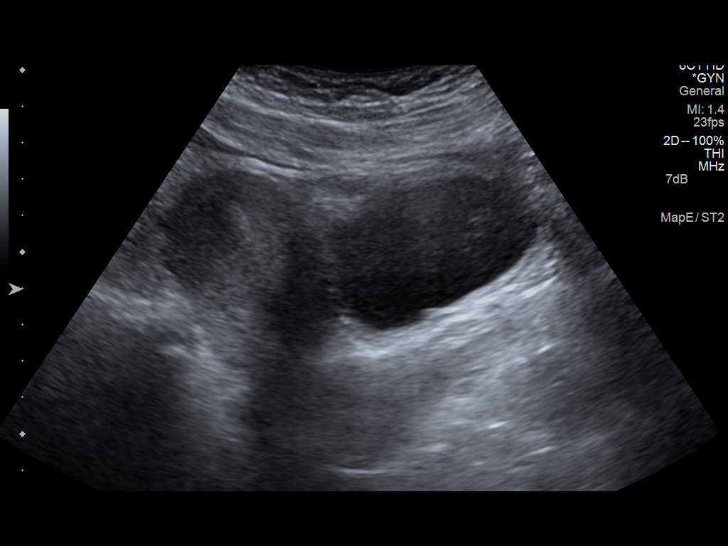
[im 10/56]
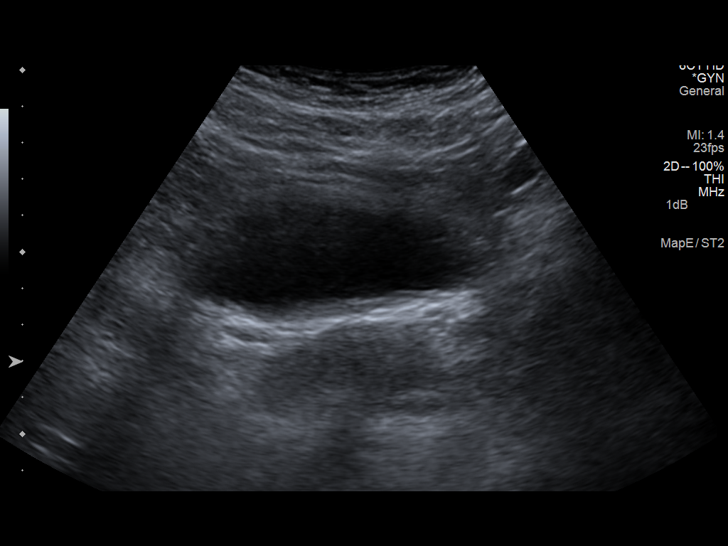
[im 14/56]
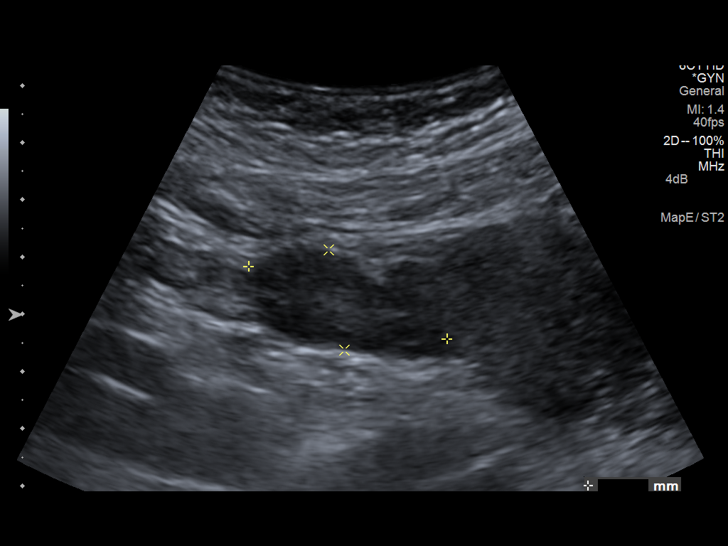
[im 19/56]
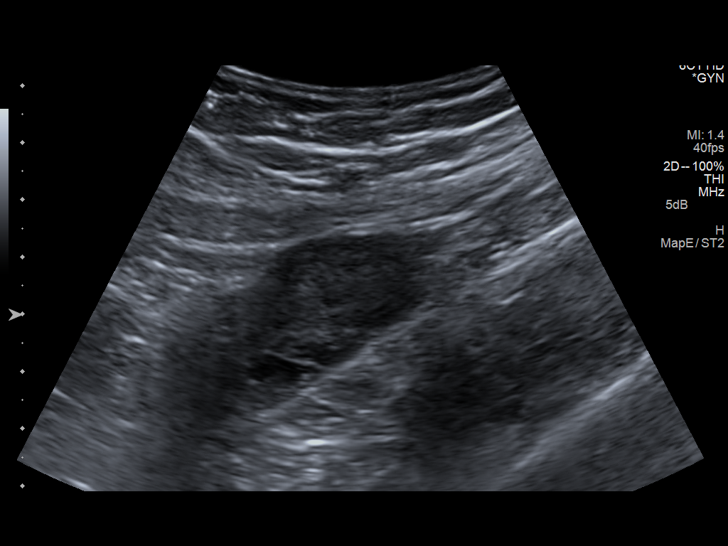
[im 21/56]
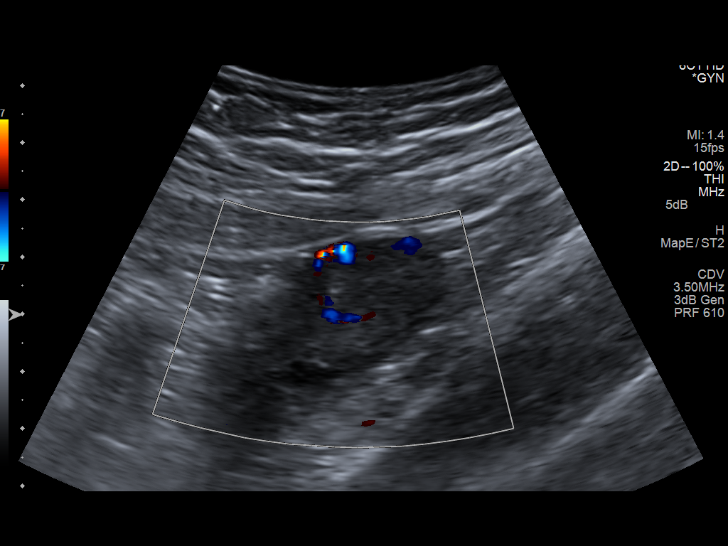
[im 26/56]
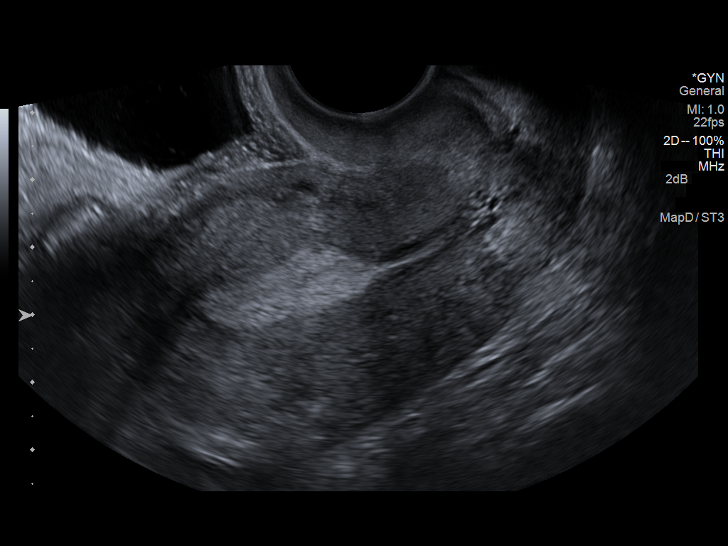
[im 30/56]
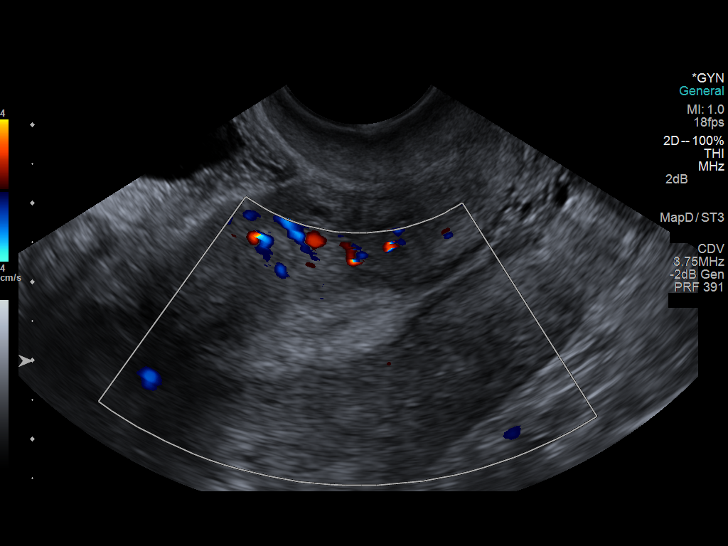
[im 35/56]
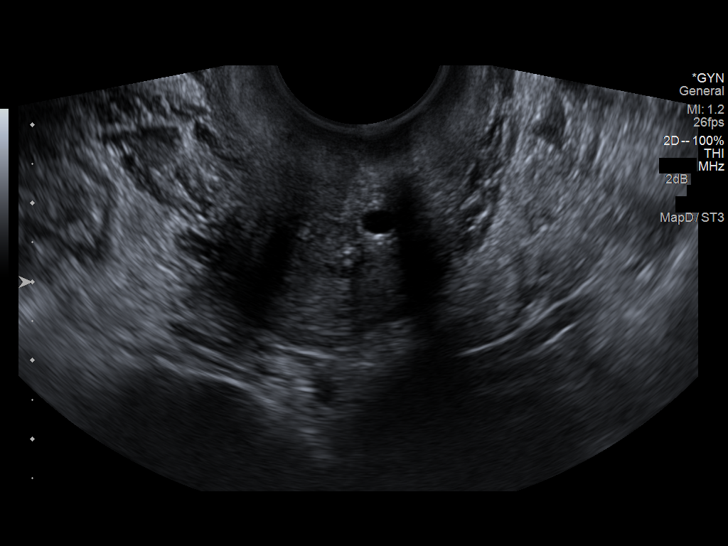
[im 37/56]
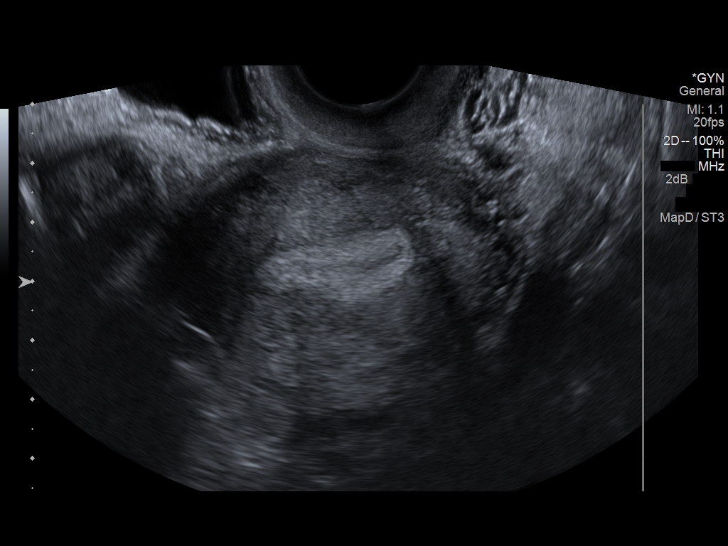
[im 42/56]
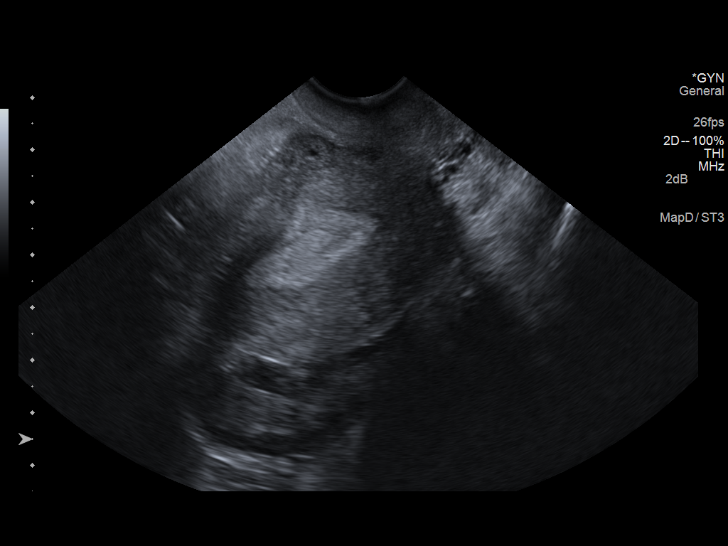
[im 46/56]
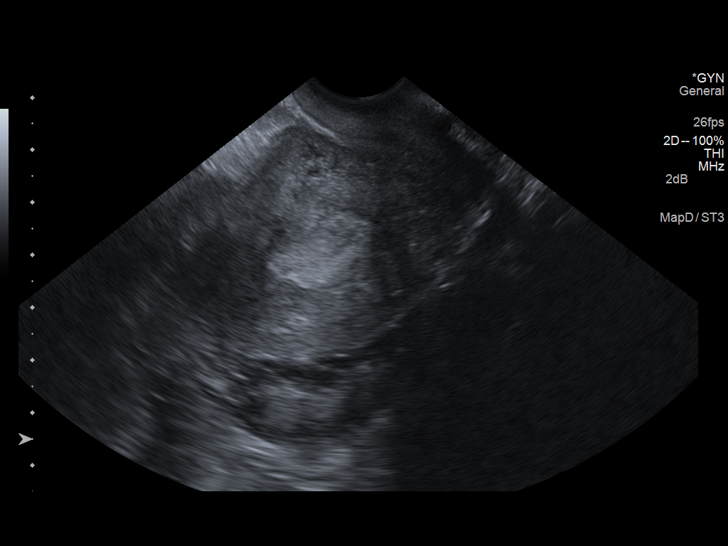
[im 51/56]
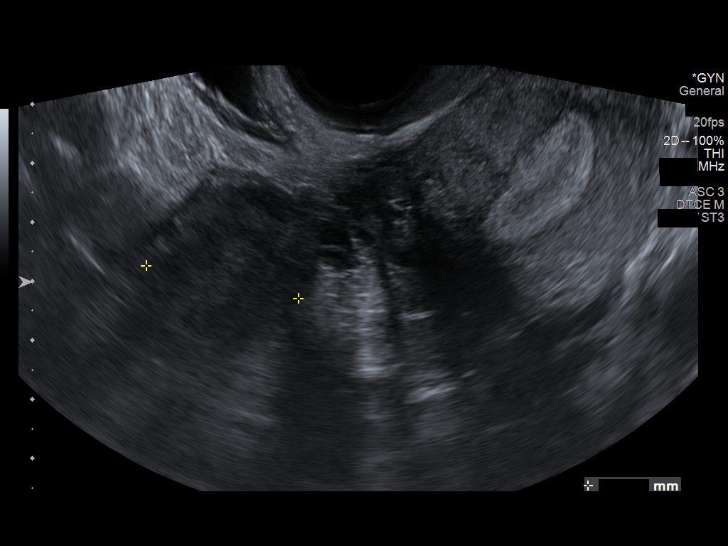
[im 56/56]
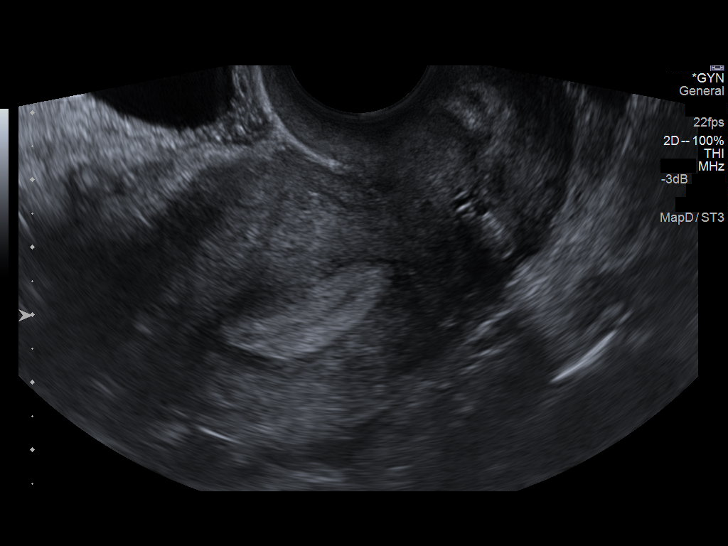

[14 of 25 positions shown; findings below may reference images not displayed]

FINDINGS: Uterus

Measurements: Normal in size at 8.2 x 3.4 x 5.7 cm.. No fibroids or
other mass visualized.

Endometrium

Thickness: Normal in thickness for premenopausal female at 8 mm.. No
focal abnormality visualized.

Right ovary

Measurements: Normal in size at 3.6 x 2.6 x 2.6 cm.. Normal small
follicles.

Left ovary

Measurements: Normal in size at 3.7 x 1.8 x 2.6 cm.. Normal small
follicles.

Other findings

Small amount free fluid the posterior cul-de-sac
IMPRESSION: 1. Small amount free fluid.
2. Normal uterus and ovaries.

## 2015-08-31 IMAGING — CT CT ABD-PELV W/ CM
2 of 4 series · 15 of 46 positions shown, 17 images · IV contrast (Omni 300)
Comparison: None.

CLINICAL DATA: Lower abdominal and pelvic pain

EXAM:
CT ABDOMEN AND PELVIS WITH CONTRAST
TECHNIQUE: Multidetector CT imaging of the abdomen and pelvis was performed
using the standard protocol following bolus administration of
intravenous contrast.
CONTRAST:  100mL OMNIPAQUE IOHEXOL 300 MG/ML  SOLN

[Series 2: abd/ pelvis 5.0 i30f 1 · axial · 0.68mm/px · z∈[-926,-516]mm · 12 of 98 slices shown, 14 images]
[im 8/98  soft-tissue]
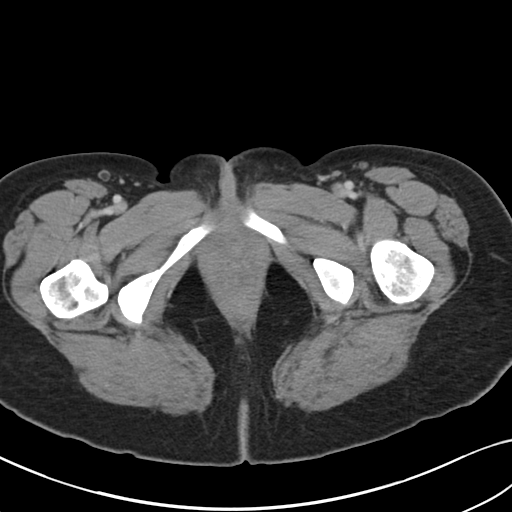
[im 8/98  bone]
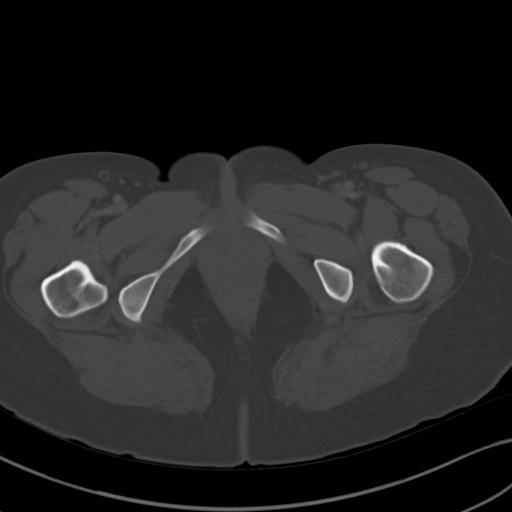
[im 15/98  soft-tissue]
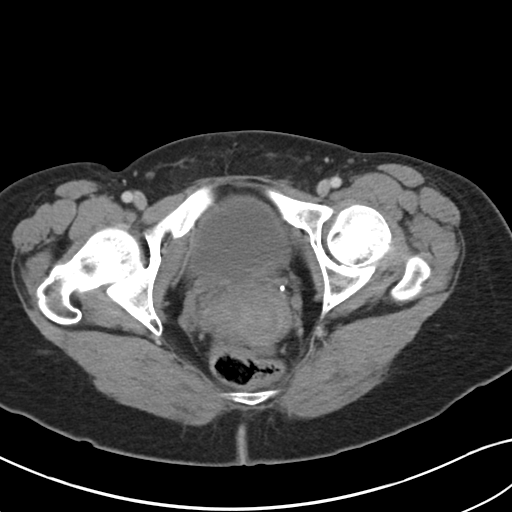
[im 22/98  soft-tissue]
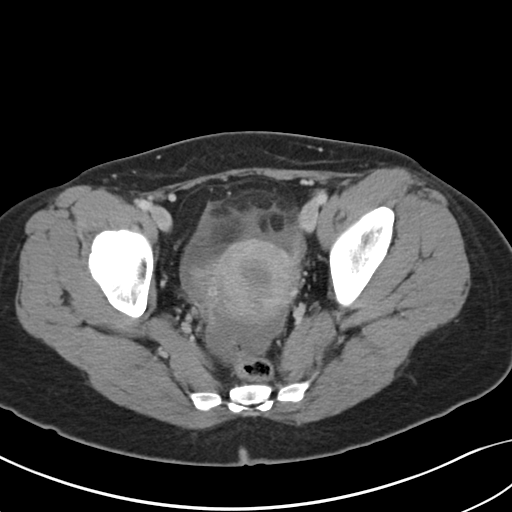
[im 29/98  soft-tissue]
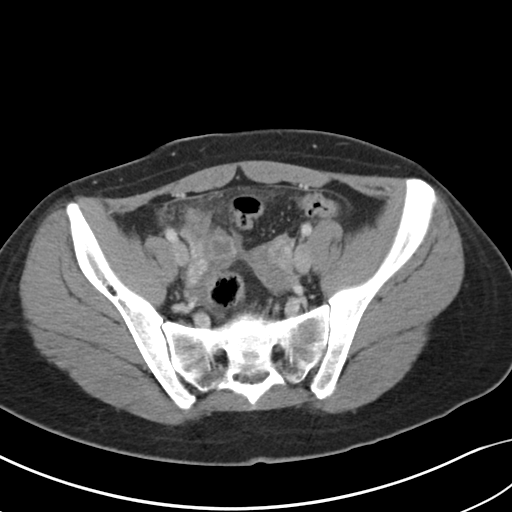
[im 36/98  soft-tissue]
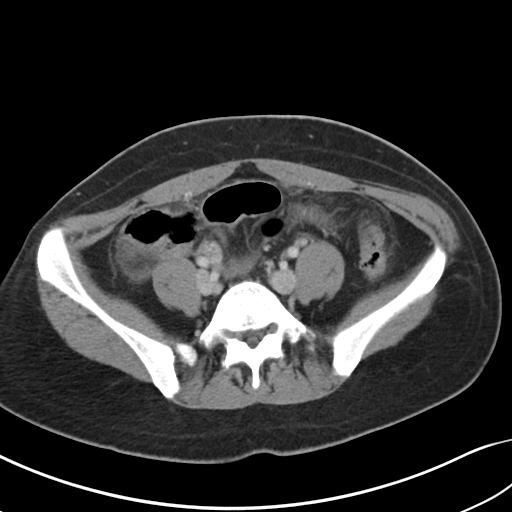
[im 44/98  soft-tissue]
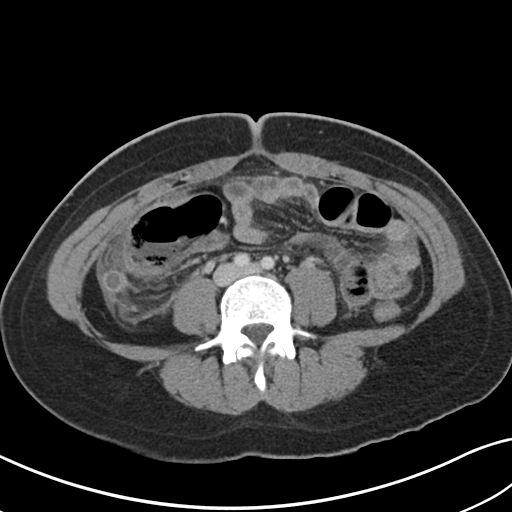
[im 54/98  soft-tissue]
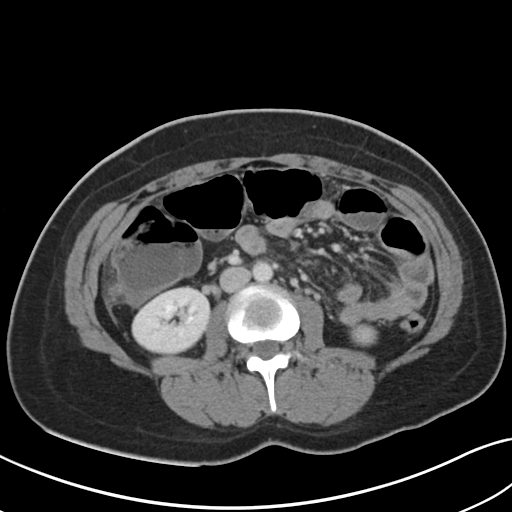
[im 62/98  soft-tissue]
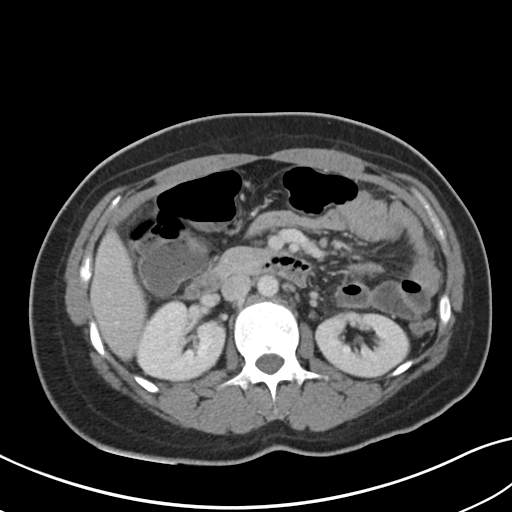
[im 69/98  soft-tissue]
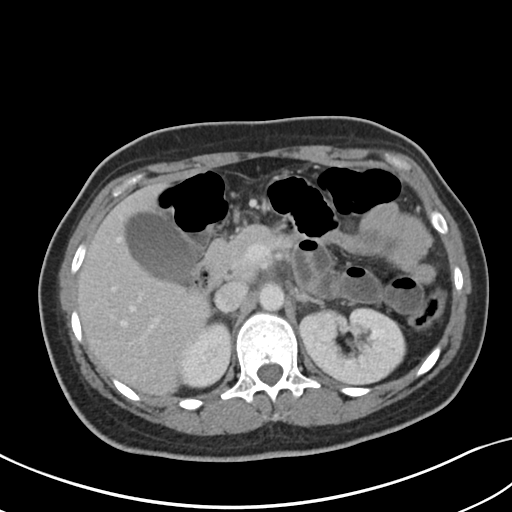
[im 69/98  bone]
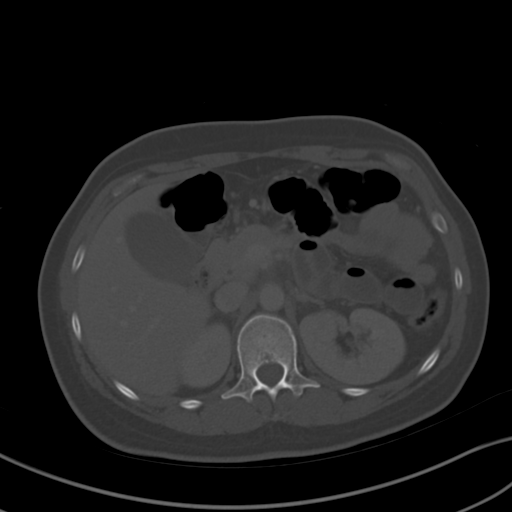
[im 76/98  soft-tissue]
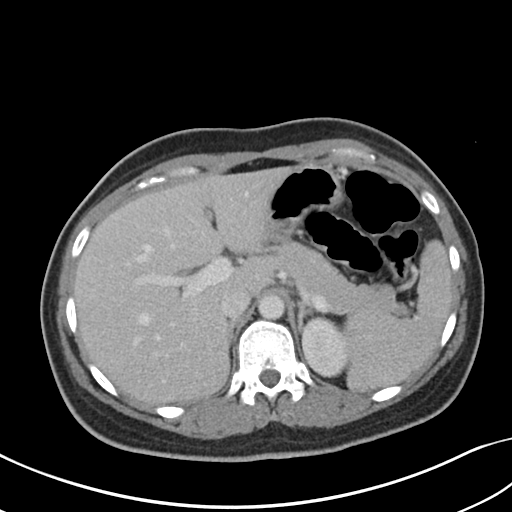
[im 83/98  soft-tissue]
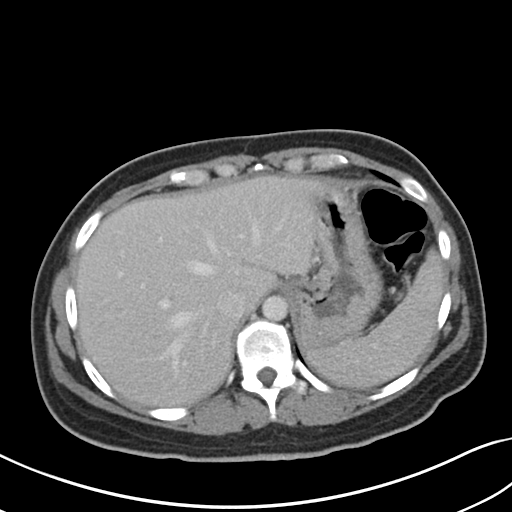
[im 90/98  soft-tissue]
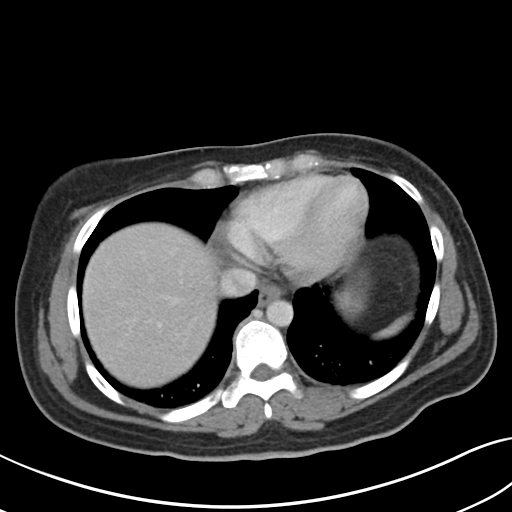

[Series 5: coronals · coronal · 0.70mm/px · 3 of 105 slices shown]
[im 35/105  soft-tissue]
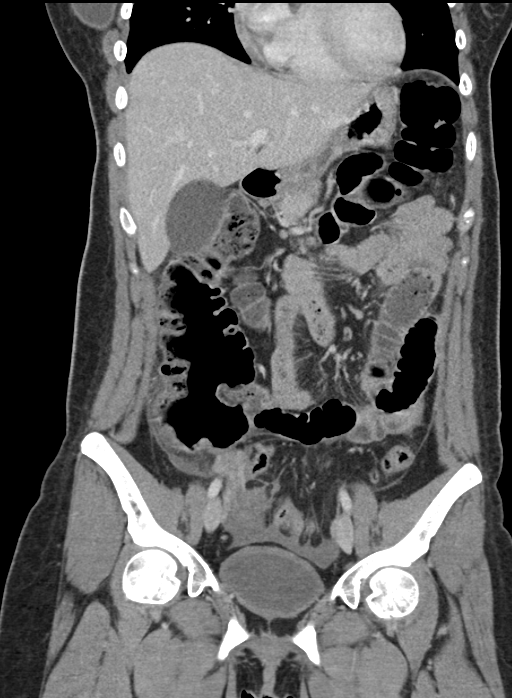
[im 47/105  soft-tissue]
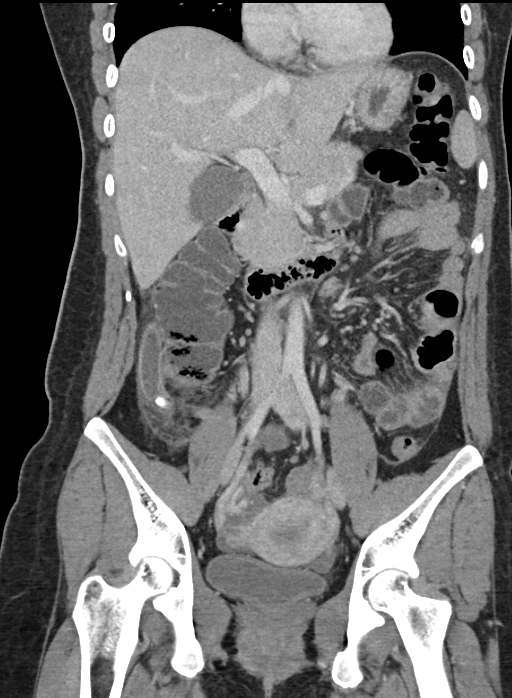
[im 58/105  soft-tissue]
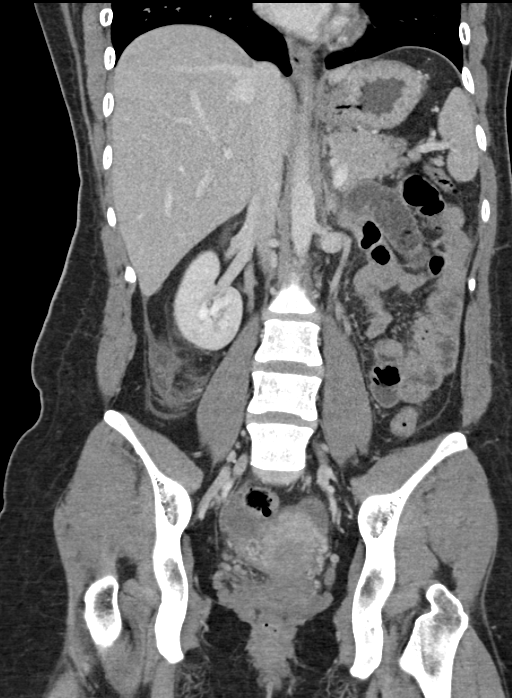

[15 of 46 positions shown; findings below may reference images not displayed]

FINDINGS: Lung bases are clear.  There are bilateral breast implants.

No focal liver lesions are identified. Gallbladder wall is not
appreciably thickened. There is no biliary duct dilatation.

Spleen, pancreas, and adrenals appear normal. There is a focal area
of decreased attenuation in the anterior left mid kidney measuring
1.5 x 0.8 cm which has attenuation values higher than is expected
with a simple cyst. There is no other renal mass. There is no
hydronephrosis on either side. No renal or ureteral calculus is
identified on either side.

In the pelvis, there is mild thickening of the urinary bladder wall.
There is moderate free fluid in the cul-de-sac. The endometrium
appears rather prominent.

There are two large appendicolith in the appendix. The appendix is
diffusely enlarged with surrounding mesenteric thickening and
surrounding fluid consistent with appendicitis. Inflammation
surrounds the right ovary. There is a probable follicle in the right
ovary which is distorted by the surrounding inflammation.

There is some wall thickening in the lateral cecum, felt to be due
to the adjacent appendicitis. There is no frank bowel obstruction.
No free air or portal venous air.

There is no appreciable adenopathy in the abdomen or pelvis. A
well-defined abscess is not seen.

Aorta shows no aneurysm. There are no blastic or lytic bone lesions.
IMPRESSION: Evidence of acute appendicitis. There may well be appendiceal
rupture. Fluid surrounds the right ovary. Fluid also tracks around
the uterus into the cul-de-sac. Mild wall thickening in the urinary
bladder may be secondary to fluid from the appendicitis. No frank
bowel obstruction.

There is a mass in the medial mid left kidney measuring 1.5 x 0.8 cm
which cannot be classified as a cyst. This mass warrants nonemergent
further surveillance. Further evaluation with pre and post contrast
MRI or CT should be considered. MRI is preferred in younger patients
(due to lack of ionizing radiation) and for evaluating calcified
lesion(s).

Critical Value/emergent results were called by telephone at the time
of interpretation on 05/31/2014 at [DATE] to Dr. GOPINATH VIRAMONTES , who
verbally acknowledged these results.

## 2016-06-21 ENCOUNTER — Other Ambulatory Visit: Payer: Self-pay | Admitting: Obstetrics and Gynecology

## 2016-06-22 LAB — CYTOLOGY - PAP

## 2016-12-20 LAB — OB RESULTS CONSOLE RUBELLA ANTIBODY, IGM
Rubella: IMMUNE
Rubella: IMMUNE

## 2016-12-20 LAB — OB RESULTS CONSOLE HEPATITIS B SURFACE ANTIGEN
Hepatitis B Surface Ag: NEGATIVE
Hepatitis B Surface Ag: NEGATIVE

## 2016-12-20 LAB — OB RESULTS CONSOLE GC/CHLAMYDIA
CHLAMYDIA, DNA PROBE: NEGATIVE
Chlamydia: NEGATIVE
GC PROBE AMP, GENITAL: NEGATIVE
Gonorrhea: NEGATIVE

## 2016-12-20 LAB — OB RESULTS CONSOLE GBS: GBS: NEGATIVE

## 2016-12-20 LAB — OB RESULTS CONSOLE HIV ANTIBODY (ROUTINE TESTING)
HIV: NONREACTIVE
HIV: NONREACTIVE

## 2016-12-20 LAB — OB RESULTS CONSOLE RPR
RPR: NONREACTIVE
RPR: NONREACTIVE

## 2016-12-31 ENCOUNTER — Telehealth (HOSPITAL_COMMUNITY): Payer: Self-pay | Admitting: *Deleted

## 2016-12-31 ENCOUNTER — Encounter (HOSPITAL_COMMUNITY): Payer: Self-pay | Admitting: *Deleted

## 2016-12-31 NOTE — Telephone Encounter (Signed)
Preadmission screen A discrepancy was documented with current ABO in EPIC vs the one on the prenatal.  Dr Ophelia Charterlark's office was notified.  They also found a result of AB and one of A in the record and will be investigating this further.

## 2017-01-03 ENCOUNTER — Encounter (HOSPITAL_COMMUNITY): Payer: Self-pay

## 2017-01-03 ENCOUNTER — Observation Stay (HOSPITAL_COMMUNITY)
Admission: RE | Admit: 2017-01-03 | Discharge: 2017-01-03 | Disposition: A | Discharge status: Home / Self Care | Payer: Medicaid Other | Source: Ambulatory Visit | Attending: Obstetrics | Admitting: Obstetrics

## 2017-01-03 DIAGNOSIS — Z3A37 37 weeks gestation of pregnancy: Secondary | ICD-10-CM | POA: Diagnosis not present

## 2017-01-03 DIAGNOSIS — O322XX0 Maternal care for transverse and oblique lie, not applicable or unspecified: Secondary | ICD-10-CM | POA: Diagnosis present

## 2017-01-03 NOTE — Progress Notes (Signed)
g2p1 @ 521w6d presented for an ECV for transverse presentation.  On arrival, she was found to have spontaneously verted to a cephalic presentation.  She reports good FM, no ctx or cramping and occ pelvic pain / pressure.  No bleeding / lof  She was placed on the NSt for 30 minutes.  She was contracting irregularly q5-7 min (and not feeling the contractions).  FHR was reassuring, w moderate variability and + accels / no decels.     She will f/u in the office next week for a routine prenatal visit.

## 2017-01-20 ENCOUNTER — Inpatient Hospital Stay (HOSPITAL_COMMUNITY): Payer: Medicaid Other | Admitting: Anesthesiology

## 2017-01-20 ENCOUNTER — Encounter (HOSPITAL_COMMUNITY): Payer: Self-pay

## 2017-01-20 ENCOUNTER — Inpatient Hospital Stay (HOSPITAL_COMMUNITY)
Admission: AD | Admit: 2017-01-20 | Discharge: 2017-01-23 | DRG: 766 | Disposition: A | Discharge status: Home / Self Care | Payer: Medicaid Other | Source: Ambulatory Visit | Attending: Obstetrics | Admitting: Obstetrics

## 2017-01-20 DIAGNOSIS — O324XX0 Maternal care for high head at term, not applicable or unspecified: Secondary | ICD-10-CM | POA: Diagnosis present

## 2017-01-20 DIAGNOSIS — O322XX0 Maternal care for transverse and oblique lie, not applicable or unspecified: Secondary | ICD-10-CM | POA: Diagnosis present

## 2017-01-20 DIAGNOSIS — O368190 Decreased fetal movements, unspecified trimester, not applicable or unspecified: Secondary | ICD-10-CM | POA: Diagnosis present

## 2017-01-20 DIAGNOSIS — O368130 Decreased fetal movements, third trimester, not applicable or unspecified: Principal | ICD-10-CM | POA: Diagnosis present

## 2017-01-20 DIAGNOSIS — Z3A40 40 weeks gestation of pregnancy: Secondary | ICD-10-CM

## 2017-01-20 HISTORY — DX: Gestational (pregnancy-induced) hypertension without significant proteinuria, unspecified trimester: O13.9

## 2017-01-20 LAB — URINALYSIS, ROUTINE W REFLEX MICROSCOPIC
Bilirubin Urine: NEGATIVE
Glucose, UA: NEGATIVE mg/dL
HGB URINE DIPSTICK: NEGATIVE
Ketones, ur: NEGATIVE mg/dL
Leukocytes, UA: NEGATIVE
Nitrite: NEGATIVE
PROTEIN: NEGATIVE mg/dL
Specific Gravity, Urine: 1.017 (ref 1.005–1.030)
pH: 7 (ref 5.0–8.0)

## 2017-01-20 LAB — CBC
HEMATOCRIT: 38.7 % (ref 36.0–46.0)
Hemoglobin: 13.3 g/dL (ref 12.0–15.0)
MCH: 30 pg (ref 26.0–34.0)
MCHC: 34.4 g/dL (ref 30.0–36.0)
MCV: 87.2 fL (ref 78.0–100.0)
PLATELETS: 182 10*3/uL (ref 150–400)
RBC: 4.44 MIL/uL (ref 3.87–5.11)
RDW: 13.8 % (ref 11.5–15.5)
WBC: 11.4 10*3/uL — AB (ref 4.0–10.5)

## 2017-01-20 LAB — TYPE AND SCREEN
ABO/RH(D): AB NEG
Antibody Screen: NEGATIVE

## 2017-01-20 MED ORDER — TERBUTALINE SULFATE 1 MG/ML IJ SOLN: 0.2500 mg | Freq: Once | INTRAMUSCULAR | Status: DC | PRN

## 2017-01-20 MED ORDER — PHENYLEPHRINE 40 MCG/ML (10ML) SYRINGE FOR IV PUSH (FOR BLOOD PRESSURE SUPPORT)
80.0000 ug | PREFILLED_SYRINGE | INTRAVENOUS | Status: DC | PRN
  Administered 2017-01-20: 80 ug via INTRAVENOUS

## 2017-01-20 MED ORDER — OXYCODONE-ACETAMINOPHEN 5-325 MG PO TABS: 2.0000 | ORAL_TABLET | ORAL | Status: DC | PRN

## 2017-01-20 MED ORDER — LACTATED RINGERS IV SOLN
500.0000 mL | INTRAVENOUS | Status: DC | PRN
  Administered 2017-01-20: 500 mL via INTRAVENOUS

## 2017-01-20 MED ORDER — LIDOCAINE HCL (PF) 1 % IJ SOLN
30.0000 mL | INTRAMUSCULAR | Status: DC | PRN
  Filled 2017-01-20: qty 30

## 2017-01-20 MED ORDER — SOD CITRATE-CITRIC ACID 500-334 MG/5ML PO SOLN
30.0000 mL | ORAL | Status: DC | PRN
Start: 2017-01-20 — End: 2017-01-21
  Administered 2017-01-21: 30 mL via ORAL
  Filled 2017-01-20: qty 15

## 2017-01-20 MED ORDER — LACTATED RINGERS IV SOLN
500.0000 mL | Freq: Once | INTRAVENOUS | Status: AC
  Administered 2017-01-20: 500 mL via INTRAVENOUS

## 2017-01-20 MED ORDER — PHENYLEPHRINE 40 MCG/ML (10ML) SYRINGE FOR IV PUSH (FOR BLOOD PRESSURE SUPPORT)
80.0000 ug | PREFILLED_SYRINGE | INTRAVENOUS | Status: DC | PRN
  Filled 2017-01-20 (×2): qty 10

## 2017-01-20 MED ORDER — OXYTOCIN 40 UNITS IN LACTATED RINGERS INFUSION - SIMPLE MED: 2.5000 [IU]/h | INTRAVENOUS | Status: DC

## 2017-01-20 MED ORDER — OXYTOCIN 40 UNITS IN LACTATED RINGERS INFUSION - SIMPLE MED
1.0000 m[IU]/min | INTRAVENOUS | Status: DC
  Administered 2017-01-20: 4 m[IU]/min via INTRAVENOUS
  Administered 2017-01-20: 2 m[IU]/min via INTRAVENOUS
  Administered 2017-01-21: 1 m[IU]/min via INTRAVENOUS
  Filled 2017-01-20: qty 1000

## 2017-01-20 MED ORDER — EPHEDRINE 5 MG/ML INJ
10.0000 mg | INTRAVENOUS | Status: DC | PRN
Start: 2017-01-20 — End: 2017-01-21

## 2017-01-20 MED ORDER — ACETAMINOPHEN 325 MG PO TABS: 650.0000 mg | ORAL_TABLET | ORAL | Status: DC | PRN

## 2017-01-20 MED ORDER — LACTATED RINGERS IV BOLUS (SEPSIS)
1000.0000 mL | Freq: Once | INTRAVENOUS | Status: AC
  Administered 2017-01-20: 1000 mL via INTRAVENOUS

## 2017-01-20 MED ORDER — DIPHENHYDRAMINE HCL 50 MG/ML IJ SOLN: 12.5000 mg | INTRAMUSCULAR | Status: DC | PRN

## 2017-01-20 MED ORDER — EPHEDRINE 5 MG/ML INJ: 10.0000 mg | INTRAVENOUS | Status: DC | PRN

## 2017-01-20 MED ORDER — OXYCODONE-ACETAMINOPHEN 5-325 MG PO TABS: 1.0000 | ORAL_TABLET | ORAL | Status: DC | PRN

## 2017-01-20 MED ORDER — LIDOCAINE HCL (PF) 1 % IJ SOLN
INTRAMUSCULAR | Status: DC | PRN
  Administered 2017-01-20 (×2): 5 mL

## 2017-01-20 MED ORDER — FENTANYL CITRATE (PF) 100 MCG/2ML IJ SOLN: 50.0000 ug | INTRAMUSCULAR | Status: DC | PRN

## 2017-01-20 MED ORDER — FENTANYL 2.5 MCG/ML BUPIVACAINE 1/10 % EPIDURAL INFUSION (WH - ANES)
14.0000 mL/h | INTRAMUSCULAR | Status: DC | PRN
  Administered 2017-01-20 (×2): 14 mL/h via EPIDURAL
  Filled 2017-01-20 (×2): qty 100

## 2017-01-20 MED ORDER — BUPIVACAINE HCL (PF) 0.25 % IJ SOLN
INTRAMUSCULAR | Status: DC | PRN
  Administered 2017-01-20 (×2): 4 mL via EPIDURAL

## 2017-01-20 MED ORDER — FENTANYL CITRATE (PF) 100 MCG/2ML IJ SOLN
INTRAMUSCULAR | Status: DC | PRN
  Administered 2017-01-20: 50 ug via EPIDURAL
  Administered 2017-01-20: 50 ug via INTRAVENOUS

## 2017-01-20 MED ORDER — FENTANYL CITRATE (PF) 100 MCG/2ML IJ SOLN
INTRAMUSCULAR | Status: AC
  Filled 2017-01-20: qty 2

## 2017-01-20 MED ORDER — ONDANSETRON HCL 4 MG/2ML IJ SOLN
4.0000 mg | Freq: Four times a day (QID) | INTRAMUSCULAR | Status: DC | PRN
Start: 2017-01-20 — End: 2017-01-21

## 2017-01-20 MED ORDER — LACTATED RINGERS IV SOLN: INTRAVENOUS | Status: DC

## 2017-01-20 MED ORDER — OXYTOCIN BOLUS FROM INFUSION: 500.0000 mL | Freq: Once | INTRAVENOUS | Status: DC

## 2017-01-20 NOTE — Anesthesia Pain Management Evaluation Note (Signed)
  [facility] Pain Management Visit Note  Patient: Bridget Huffman, 31 y.o., female  "Hello I am a member of the anesthesia team at [facility]. We have an anesthesia team available at all times to provide care throughout the hospital, including epidural management and anesthesia for C-section. I don't know your plan for the delivery whether it a natural birth, water birth, IV sedation, nitrous supplementation, doula or epidural, but we want to meet your pain goals."   1.Was your pain managed to your expectations on prior hospitalizations?   Yes   2.What is your expectation for pain management during this hospitalization?     Epidural  3.How can we help you reach that goal? Epidural   Record the patient's initial score and the patient's pain goal.   Pain: 4  Pain Goal: 7 [facility] wants you to be able to say your pain was always managed very well.  Skarleth Delmonico 01/20/2017

## 2017-01-20 NOTE — Progress Notes (Signed)
Called to see and examine patient due to SROM.  On exam, grossly ruptured.  No cord prolapse.    With external pressure, the fetal head was moved to midline and fetal body moved to a more midline position.  A tight abdominal binder was placed.  Repeat cervical exam showed fetal vertex as presenting part.    Fetal status reassuring.  Will closely monitor fetal lie

## 2017-01-20 NOTE — Anesthesia Procedure Notes (Signed)
Epidural Patient location during procedure: OB  Staffing Anesthesiologist: Amarys Sliwinski Performed: anesthesiologist   Preanesthetic Checklist Completed: patient identified, site marked, surgical consent, pre-op evaluation, timeout performed, IV checked, risks and benefits discussed and monitors and equipment checked  Epidural Patient position: sitting Prep: DuraPrep Patient monitoring: heart rate, continuous pulse ox and blood pressure Approach: right paramedian Location: L3-L4 Injection technique: LOR saline  Needle:  Needle type: Tuohy  Needle gauge: 17 G Needle length: 9 cm and 9 Needle insertion depth: 6 cm Catheter type: closed end flexible Catheter size: 20 Guage Catheter at skin depth: 11 cm Test dose: negative  Assessment Events: blood not aspirated, injection not painful, no injection resistance, negative IV test and no paresthesia  Additional Notes Patient identified. Risks/Benefits/Options discussed with patient including but not limited to bleeding, infection, nerve damage, paralysis, failed block, incomplete pain control, headache, blood pressure changes, nausea, vomiting, reactions to medication both or allergic, itching and postpartum back pain. Confirmed with bedside nurse the patient's most recent platelet count. Confirmed with patient that they are not currently taking any anticoagulation, have any bleeding history or any family history of bleeding disorders. Patient expressed understanding and wished to proceed. All questions were answered. Sterile technique was used throughout the entire procedure. Please see nursing notes for vital signs. Test dose was given through epidural needle and negative prior to continuing to dose epidural or start infusion. Warning signs of high block given to the patient including shortness of breath, tingling/numbness in hands, complete motor block, or any concerning symptoms with instructions to call for help. Patient was given  instructions on fall risk and not to get out of bed. All questions and concerns addressed with instructions to call with any issues.     

## 2017-01-20 NOTE — H&P (Signed)
31 y.o. G2P1001 @ 191w2d presents with decreased fetal movement.  She reports no fetal movement since 2 AM.  On EFM--baby with moderate variability and accelerations, but given GA>40 weeks, recommend admission for delivery today.  Otherwise has good fetal movement and no bleeding.  Pregnancy c/b: 1.  History of preeclampsia w G1--on ASA 81mg  this pregnancy   Past Medical History:  Diagnosis Date  . Hx of preeclampsia, prior pregnancy, currently pregnant   . No pertinent past medical history   . Pregnancy induced hypertension    previous pregnancy    Past Surgical History:  Procedure Laterality Date  . APPENDECTOMY    . LAPAROSCOPIC APPENDECTOMY N/A 05/31/2014   Procedure: APPENDECTOMY LAPAROSCOPIC;  Surgeon: Axel FillerArmando Ramirez, MD;  Location: [facility];  Service: General;  Laterality: N/A;    OB History  Gravida Para Term Preterm AB Living  2 1 1  0 0 1  SAB TAB Ectopic Multiple Live Births  0 0 0 0 1    # Outcome Date GA Lbr Len/2nd Weight Sex Delivery Anes PTL Lv  2 Current           1 Term 03/28/12 7459w0d 10:40 / 04:56 3.221 kg (7 lb 1.6 oz) M Vag-Spont EPI  LIV      Social History   Social History  . Marital status: Married    Spouse name: N/A  . Number of children: N/A  . Years of education: N/A   Occupational History  . Not on file.   Social History Main Topics  . Smoking status: Never Smoker  . Smokeless tobacco: Never Used  . Alcohol use No  . Drug use: No  . Sexual activity: Yes    Birth control/ protection: None   Other Topics Concern  . Not on file   Social History Narrative  . No narrative on file   Patient has no known allergies.    Prenatal Transfer Tool  Maternal Diabetes: No Genetic Screening: Normal FTS Maternal Ultrasounds/Referrals: Normal Fetal Ultrasounds or other Referrals:  None Maternal Substance Abuse:  No Significant Maternal Medications:  None Significant Maternal Lab Results: Lab values include: Group B Strep negative  ABO, Rh:  --/--/AB NEG (08/12 1330) Antibody: NEG (08/12 1330) Rubella: Immune, Immune (07/12 0000) RPR: Nonreactive, Nonreactive (07/12 0000)  HBsAg: Negative, Negative (07/12 0000)  HIV: Non-reactive, Non-reactive (07/12 0000)  GBS: Negative (07/12 0000)     Other PNC: uncomplicated.    Vitals:   01/20/17 1609 01/20/17 1630  BP: 103/70 117/82  Pulse: 69 70  Resp: 16   Temp:       General:  NAD Abdomen:  soft, gravid, EFW 8-8.5# Ex:  trace edema SVE:  3-4/long/-2, vertex palpable but to the patient's right FHTs:  130s, mod var, + accels, no decels Toco:  q10 min  EFW by US on 8/10 9lb 5oz (4224gm) but by leopolds 8.5#.   A/P   31 y.o. G2P1001 1591w2d presents with decreased fetal movement at term and oblique lie Admit to L&D Oblique lie--head just off midline to patient's right.  Easily moves to midline w gentle manipulation.  Discussed options with patient.  Will start IOL w pitocin.  When in good contraction pattern, will move baby to midline and perform amniotomy.  She is aware that given oblique lie, she is at increased risk of cesarean delivery for malpresentation and cord prolapse Epidural upon request   FSR/ vtx/ GBS neg  [facility] GEFFEL Chestine SporeLARK

## 2017-01-20 NOTE — Anesthesia Preprocedure Evaluation (Signed)
Anesthesia Evaluation  Patient identified by MRN, date of birth, ID band Patient awake    Reviewed: Allergy & Precautions, H&P , NPO status , Patient's Chart, lab work & pertinent test results  History of Anesthesia Complications Negative for: history of anesthetic complications  Airway Mallampati: II  TM Distance: >3 FB Neck ROM: full    Dental no notable dental hx. (+) Teeth Intact   Pulmonary neg pulmonary ROS,    Pulmonary exam normal breath sounds clear to auscultation       Cardiovascular hypertension, negative cardio ROS Normal cardiovascular exam Rhythm:regular Rate:Normal     Neuro/Psych negative neurological ROS  negative psych ROS   GI/Hepatic negative GI ROS, Neg liver ROS,   Endo/Other  negative endocrine ROS  Renal/GU negative Renal ROS  negative genitourinary   Musculoskeletal   Abdominal   Peds  Hematology negative hematology ROS (+)   Anesthesia Other Findings   Reproductive/Obstetrics (+) Pregnancy                             Anesthesia Physical Anesthesia Plan  ASA: II  Anesthesia Plan: Epidural   Post-op Pain Management:    Induction:   PONV Risk Score and Plan:   Airway Management Planned:   Additional Equipment:   Intra-op Plan:   Post-operative Plan:   Informed Consent: I have reviewed the patients History and Physical, chart, labs and discussed the procedure including the risks, benefits and alternatives for the proposed anesthesia with the patient or authorized representative who has indicated his/her understanding and acceptance.     Plan Discussed with:   Anesthesia Plan Comments:         Anesthesia Quick Evaluation  

## 2017-01-20 NOTE — MAU Note (Addendum)
G2P1 @ 40.[redacted] wksga. Presents to triage for r/o labor.  Contracting since 0100. Denies LOF or bleeding. States decreased FM. EFM applied.   SVE: 2/40/? Can not feel head.cervix very posterior.   Pt states had a version done for transverse lie and was successful  1229: Dr. Chestine Sporelark notified. Report status of pt given. In middle of a delivery and will call back. Reported off to [facility] but states MD will call back.   1310: Dr. Chestine Sporelark at bs assessing and bs U/S done for position of fetus. Fetal head slightly to mom's right.   SVE 3/ long checked by Dr. Chestine Sporelark. Ordered to admit pt for post dates and pt stating decreased FM.   1340: Labs and IV started.   1350: Pt to birthing via wheelchair taken by RN tech.

## 2017-01-21 ENCOUNTER — Encounter (HOSPITAL_COMMUNITY): Payer: Self-pay

## 2017-01-21 ENCOUNTER — Encounter (HOSPITAL_COMMUNITY)
Admission: AD | Disposition: A | Discharge status: Home / Self Care | Payer: Self-pay | Source: Ambulatory Visit | Attending: Obstetrics

## 2017-01-21 LAB — RPR: RPR: NONREACTIVE

## 2017-01-21 SURGERY — Surgical Case
Anesthesia: Epidural

## 2017-01-21 MED ORDER — ACETAMINOPHEN 325 MG PO TABS: 650.0000 mg | ORAL_TABLET | ORAL | Status: DC | PRN

## 2017-01-21 MED ORDER — DIPHENHYDRAMINE HCL 50 MG/ML IJ SOLN
INTRAMUSCULAR | Status: DC | PRN
  Administered 2017-01-21: 25 mg via INTRAVENOUS

## 2017-01-21 MED ORDER — PHENYLEPHRINE 40 MCG/ML (10ML) SYRINGE FOR IV PUSH (FOR BLOOD PRESSURE SUPPORT)
PREFILLED_SYRINGE | INTRAVENOUS | Status: AC
  Filled 2017-01-21: qty 10

## 2017-01-21 MED ORDER — PRENATAL MULTIVITAMIN CH
1.0000 | ORAL_TABLET | Freq: Every day | ORAL | Status: DC
Start: 2017-01-21 — End: 2017-01-23
  Administered 2017-01-21 – 2017-01-23 (×3): 1 via ORAL
  Filled 2017-01-21 (×3): qty 1

## 2017-01-21 MED ORDER — KETOROLAC TROMETHAMINE 30 MG/ML IJ SOLN
30.0000 mg | Freq: Four times a day (QID) | INTRAMUSCULAR | Status: AC | PRN
  Administered 2017-01-21: 30 mg via INTRAMUSCULAR

## 2017-01-21 MED ORDER — KETOROLAC TROMETHAMINE 30 MG/ML IJ SOLN: 30.0000 mg | Freq: Four times a day (QID) | INTRAMUSCULAR | Status: AC | PRN

## 2017-01-21 MED ORDER — LACTATED RINGERS IV SOLN: INTRAVENOUS | Status: DC

## 2017-01-21 MED ORDER — DEXAMETHASONE SODIUM PHOSPHATE 4 MG/ML IJ SOLN
INTRAMUSCULAR | Status: DC | PRN
  Administered 2017-01-21: 4 mg via INTRAVENOUS

## 2017-01-21 MED ORDER — GENTAMICIN SULFATE 40 MG/ML IJ SOLN
180.0000 mg | Freq: Once | INTRAMUSCULAR | Status: DC
  Filled 2017-01-21: qty 4.5

## 2017-01-21 MED ORDER — ACETAMINOPHEN 500 MG PO TABS
1000.0000 mg | ORAL_TABLET | Freq: Once | ORAL | Status: AC
  Administered 2017-01-21: 1000 mg via ORAL
  Filled 2017-01-21: qty 2

## 2017-01-21 MED ORDER — SODIUM CHLORIDE 0.9 % IJ SOLN
INTRAMUSCULAR | Status: AC
  Filled 2017-01-21: qty 20

## 2017-01-21 MED ORDER — MORPHINE SULFATE (PF) 0.5 MG/ML IJ SOLN
INTRAMUSCULAR | Status: AC
  Filled 2017-01-21: qty 10

## 2017-01-21 MED ORDER — WITCH HAZEL-GLYCERIN EX PADS: 1.0000 "application " | MEDICATED_PAD | CUTANEOUS | Status: DC | PRN

## 2017-01-21 MED ORDER — DIPHENHYDRAMINE HCL 25 MG PO CAPS: 25.0000 mg | ORAL_CAPSULE | ORAL | Status: DC | PRN

## 2017-01-21 MED ORDER — SODIUM CHLORIDE 0.9 % IR SOLN
Status: DC | PRN
  Administered 2017-01-21: 50 mL

## 2017-01-21 MED ORDER — SODIUM CHLORIDE 0.9 % IV SOLN
2.0000 g | INTRAVENOUS | Status: AC
  Administered 2017-01-21: 2 g via INTRAVENOUS
  Filled 2017-01-21: qty 2000

## 2017-01-21 MED ORDER — DIBUCAINE 1 % RE OINT: 1.0000 "application " | TOPICAL_OINTMENT | RECTAL | Status: DC | PRN

## 2017-01-21 MED ORDER — OXYTOCIN 40 UNITS IN LACTATED RINGERS INFUSION - SIMPLE MED: 2.5000 [IU]/h | INTRAVENOUS | Status: AC

## 2017-01-21 MED ORDER — MEPERIDINE HCL 25 MG/ML IJ SOLN
INTRAMUSCULAR | Status: DC | PRN
  Administered 2017-01-21 (×2): 12.5 mg via INTRAVENOUS

## 2017-01-21 MED ORDER — NALBUPHINE HCL 10 MG/ML IJ SOLN: 5.0000 mg | INTRAMUSCULAR | Status: DC | PRN

## 2017-01-21 MED ORDER — METOCLOPRAMIDE HCL 5 MG/ML IJ SOLN: 10.0000 mg | Freq: Once | INTRAMUSCULAR | Status: DC | PRN

## 2017-01-21 MED ORDER — ONDANSETRON HCL 4 MG/2ML IJ SOLN: 4.0000 mg | Freq: Three times a day (TID) | INTRAMUSCULAR | Status: DC | PRN

## 2017-01-21 MED ORDER — COCONUT OIL OIL: 1.0000 "application " | TOPICAL_OIL | Status: DC | PRN

## 2017-01-21 MED ORDER — TETANUS-DIPHTH-ACELL PERTUSSIS 5-2.5-18.5 LF-MCG/0.5 IM SUSP: 0.5000 mL | Freq: Once | INTRAMUSCULAR | Status: DC

## 2017-01-21 MED ORDER — SIMETHICONE 80 MG PO CHEW: 80.0000 mg | CHEWABLE_TABLET | ORAL | Status: DC | PRN

## 2017-01-21 MED ORDER — NALBUPHINE HCL 10 MG/ML IJ SOLN: 5.0000 mg | Freq: Once | INTRAMUSCULAR | Status: DC | PRN

## 2017-01-21 MED ORDER — METOCLOPRAMIDE HCL 5 MG/ML IJ SOLN
INTRAMUSCULAR | Status: DC | PRN
  Administered 2017-01-21: 10 mg via INTRAVENOUS

## 2017-01-21 MED ORDER — IBUPROFEN 600 MG PO TABS
600.0000 mg | ORAL_TABLET | Freq: Four times a day (QID) | ORAL | Status: DC
  Administered 2017-01-21 – 2017-01-23 (×9): 600 mg via ORAL
  Filled 2017-01-21 (×9): qty 1

## 2017-01-21 MED ORDER — METOCLOPRAMIDE HCL 5 MG/ML IJ SOLN
INTRAMUSCULAR | Status: AC
  Filled 2017-01-21: qty 2

## 2017-01-21 MED ORDER — SIMETHICONE 80 MG PO CHEW
80.0000 mg | CHEWABLE_TABLET | Freq: Three times a day (TID) | ORAL | Status: DC
  Administered 2017-01-21 – 2017-01-23 (×7): 80 mg via ORAL
  Filled 2017-01-21 (×7): qty 1

## 2017-01-21 MED ORDER — ONDANSETRON HCL 4 MG/2ML IJ SOLN
INTRAMUSCULAR | Status: DC | PRN
  Administered 2017-01-21: 4 mg via INTRAVENOUS

## 2017-01-21 MED ORDER — FENTANYL CITRATE (PF) 100 MCG/2ML IJ SOLN: 25.0000 ug | INTRAMUSCULAR | Status: DC | PRN

## 2017-01-21 MED ORDER — DIPHENHYDRAMINE HCL 50 MG/ML IJ SOLN: 12.5000 mg | INTRAMUSCULAR | Status: DC | PRN

## 2017-01-21 MED ORDER — OXYTOCIN 10 UNIT/ML IJ SOLN
INTRAMUSCULAR | Status: AC
  Filled 2017-01-21: qty 4

## 2017-01-21 MED ORDER — SCOPOLAMINE 1 MG/3DAYS TD PT72: 1.0000 | MEDICATED_PATCH | Freq: Once | TRANSDERMAL | Status: DC

## 2017-01-21 MED ORDER — ACETAMINOPHEN 500 MG PO TABS
1000.0000 mg | ORAL_TABLET | Freq: Four times a day (QID) | ORAL | Status: AC
  Administered 2017-01-21 – 2017-01-22 (×4): 1000 mg via ORAL
  Filled 2017-01-21 (×4): qty 2

## 2017-01-21 MED ORDER — OXYCODONE HCL 5 MG PO TABS: 5.0000 mg | ORAL_TABLET | ORAL | Status: DC | PRN

## 2017-01-21 MED ORDER — DEXTROSE 5 % IV SOLN
Freq: Once | INTRAVENOUS | Status: AC
  Administered 2017-01-21: 12:00:00 via INTRAVENOUS
  Filled 2017-01-21: qty 4.5

## 2017-01-21 MED ORDER — MEPERIDINE HCL 25 MG/ML IJ SOLN
INTRAMUSCULAR | Status: AC
  Filled 2017-01-21: qty 1

## 2017-01-21 MED ORDER — CLINDAMYCIN PHOSPHATE 900 MG/50ML IV SOLN: 900.0000 mg | Freq: Once | INTRAVENOUS | Status: DC

## 2017-01-21 MED ORDER — MEPERIDINE HCL 25 MG/ML IJ SOLN: 6.2500 mg | INTRAMUSCULAR | Status: DC | PRN

## 2017-01-21 MED ORDER — PHENYLEPHRINE HCL 10 MG/ML IJ SOLN
INTRAMUSCULAR | Status: DC | PRN
  Administered 2017-01-21 (×4): 80 ug via INTRAVENOUS

## 2017-01-21 MED ORDER — DEXAMETHASONE SODIUM PHOSPHATE 4 MG/ML IJ SOLN
INTRAMUSCULAR | Status: AC
  Filled 2017-01-21: qty 1

## 2017-01-21 MED ORDER — DIPHENHYDRAMINE HCL 25 MG PO CAPS: 25.0000 mg | ORAL_CAPSULE | Freq: Four times a day (QID) | ORAL | Status: DC | PRN

## 2017-01-21 MED ORDER — NALOXONE HCL 0.4 MG/ML IJ SOLN: 0.4000 mg | INTRAMUSCULAR | Status: DC | PRN

## 2017-01-21 MED ORDER — LACTATED RINGERS IV SOLN
INTRAVENOUS | Status: DC
  Administered 2017-01-21 – 2017-01-22 (×2): via INTRAVENOUS

## 2017-01-21 MED ORDER — SCOPOLAMINE 1 MG/3DAYS TD PT72
MEDICATED_PATCH | TRANSDERMAL | Status: DC | PRN
  Administered 2017-01-21: 1 via TRANSDERMAL

## 2017-01-21 MED ORDER — DIPHENHYDRAMINE HCL 50 MG/ML IJ SOLN
INTRAMUSCULAR | Status: AC
  Filled 2017-01-21: qty 1

## 2017-01-21 MED ORDER — MORPHINE SULFATE (PF) 0.5 MG/ML IJ SOLN
INTRAMUSCULAR | Status: DC | PRN
  Administered 2017-01-21 (×2): .5 mg via EPIDURAL
  Administered 2017-01-21: 4 mg via EPIDURAL

## 2017-01-21 MED ORDER — MENTHOL 3 MG MT LOZG: 1.0000 | LOZENGE | OROMUCOSAL | Status: DC | PRN

## 2017-01-21 MED ORDER — NALOXONE HCL 2 MG/2ML IJ SOSY
1.0000 ug/kg/h | PREFILLED_SYRINGE | INTRAVENOUS | Status: DC | PRN
  Filled 2017-01-21: qty 2

## 2017-01-21 MED ORDER — SIMETHICONE 80 MG PO CHEW
80.0000 mg | CHEWABLE_TABLET | ORAL | Status: DC
  Administered 2017-01-21 – 2017-01-22 (×2): 80 mg via ORAL
  Filled 2017-01-21 (×2): qty 1

## 2017-01-21 MED ORDER — SCOPOLAMINE 1 MG/3DAYS TD PT72
MEDICATED_PATCH | TRANSDERMAL | Status: AC
  Filled 2017-01-21: qty 1

## 2017-01-21 MED ORDER — LACTATED RINGERS IV SOLN
INTRAVENOUS | Status: DC | PRN
  Administered 2017-01-21 (×3): via INTRAVENOUS

## 2017-01-21 MED ORDER — ONDANSETRON HCL 4 MG/2ML IJ SOLN
INTRAMUSCULAR | Status: AC
  Filled 2017-01-21: qty 2

## 2017-01-21 MED ORDER — OXYCODONE HCL 5 MG PO TABS: 10.0000 mg | ORAL_TABLET | ORAL | Status: DC | PRN

## 2017-01-21 MED ORDER — CEFAZOLIN SODIUM-DEXTROSE 2-4 GM/100ML-% IV SOLN: 2.0000 g | INTRAVENOUS | Status: DC

## 2017-01-21 MED ORDER — METHYLERGONOVINE MALEATE 0.2 MG/ML IJ SOLN
INTRAMUSCULAR | Status: DC | PRN
  Administered 2017-01-21: 0.2 mg via INTRAMUSCULAR

## 2017-01-21 MED ORDER — SODIUM BICARBONATE 8.4 % IV SOLN
INTRAVENOUS | Status: DC | PRN
  Administered 2017-01-21 (×2): 5 mL via EPIDURAL

## 2017-01-21 MED ORDER — KETOROLAC TROMETHAMINE 30 MG/ML IJ SOLN
INTRAMUSCULAR | Status: AC
  Filled 2017-01-21: qty 1

## 2017-01-21 MED ORDER — SENNOSIDES-DOCUSATE SODIUM 8.6-50 MG PO TABS
2.0000 | ORAL_TABLET | ORAL | Status: DC
  Administered 2017-01-21 – 2017-01-22 (×2): 2 via ORAL
  Filled 2017-01-21 (×2): qty 2

## 2017-01-21 MED ORDER — SODIUM CHLORIDE 0.9% FLUSH
3.0000 mL | INTRAVENOUS | Status: DC | PRN
  Administered 2017-01-22: 3 mL via INTRAVENOUS
  Filled 2017-01-21: qty 3

## 2017-01-21 MED ORDER — GENTAMICIN SULFATE 40 MG/ML IJ SOLN
170.0000 mg | Freq: Three times a day (TID) | INTRAVENOUS | Status: DC
  Filled 2017-01-21: qty 4.25

## 2017-01-21 SURGICAL SUPPLY — 38 items
BENZOIN TINCTURE PRP APPL 2/3 (GAUZE/BANDAGES/DRESSINGS) ×3 IMPLANT
CHLORAPREP W/TINT 26ML (MISCELLANEOUS) ×3 IMPLANT
CLAMP CORD UMBIL (MISCELLANEOUS) IMPLANT
CLOSURE STERI-STRIP 1/2X4 (GAUZE/BANDAGES/DRESSINGS) ×1
CLOSURE WOUND 1/2 X4 (GAUZE/BANDAGES/DRESSINGS) ×1
CLOTH BEACON ORANGE TIMEOUT ST (SAFETY) ×3 IMPLANT
CLSR STERI-STRIP ANTIMIC 1/2X4 (GAUZE/BANDAGES/DRESSINGS) ×2 IMPLANT
DRSG OPSITE POSTOP 4X10 (GAUZE/BANDAGES/DRESSINGS) ×3 IMPLANT
ELECT REM PT RETURN 9FT ADLT (ELECTROSURGICAL) ×3
ELECTRODE REM PT RTRN 9FT ADLT (ELECTROSURGICAL) ×1 IMPLANT
EXTRACTOR VACUUM KIWI (MISCELLANEOUS) IMPLANT
GLOVE BIOGEL PI IND STRL 6.5 (GLOVE) ×1 IMPLANT
GLOVE BIOGEL PI IND STRL 7.0 (GLOVE) ×1 IMPLANT
GLOVE BIOGEL PI INDICATOR 6.5 (GLOVE) ×2
GLOVE BIOGEL PI INDICATOR 7.0 (GLOVE) ×2
GLOVE ECLIPSE 6.0 STRL STRAW (GLOVE) ×3 IMPLANT
GOWN STRL REUS W/TWL LRG LVL3 (GOWN DISPOSABLE) ×6 IMPLANT
HOVERMATT SINGLE USE (MISCELLANEOUS) ×3 IMPLANT
KIT ABG SYR 3ML LUER SLIP (SYRINGE) IMPLANT
NEEDLE HYPO 25X5/8 SAFETYGLIDE (NEEDLE) IMPLANT
NS IRRIG 1000ML POUR BTL (IV SOLUTION) ×3 IMPLANT
PACK C SECTION WH (CUSTOM PROCEDURE TRAY) ×3 IMPLANT
PAD OB MATERNITY 4.3X12.25 (PERSONAL CARE ITEMS) ×3 IMPLANT
PENCIL SMOKE EVAC W/HOLSTER (ELECTROSURGICAL) ×3 IMPLANT
RTRCTR C-SECT PINK 25CM LRG (MISCELLANEOUS) ×3 IMPLANT
STRIP CLOSURE SKIN 1/2X4 (GAUZE/BANDAGES/DRESSINGS) ×2 IMPLANT
SUT MNCRL 0 VIOLET CTX 36 (SUTURE) ×2 IMPLANT
SUT MNCRL AB 3-0 PS2 27 (SUTURE) ×3 IMPLANT
SUT MONOCRYL 0 CTX 36 (SUTURE) ×4
SUT PLAIN 0 NONE (SUTURE) IMPLANT
SUT PLAIN 2 0 (SUTURE) ×2
SUT PLAIN ABS 2-0 CT1 27XMFL (SUTURE) ×1 IMPLANT
SUT VIC AB 0 CTX 36 (SUTURE) ×4
SUT VIC AB 0 CTX36XBRD ANBCTRL (SUTURE) ×2 IMPLANT
SUT VIC AB 2-0 CT1 27 (SUTURE) ×6
SUT VIC AB 2-0 CT1 TAPERPNT 27 (SUTURE) ×3 IMPLANT
TOWEL OR 17X24 6PK STRL BLUE (TOWEL DISPOSABLE) ×3 IMPLANT
TRAY FOLEY BAG SILVER LF 14FR (SET/KITS/TRAYS/PACK) ×3 IMPLANT

## 2017-01-21 NOTE — Brief Op Note (Addendum)
01/20/2017 - 01/21/2017  7:13 AM  PATIENT:  Bridget Huffman  30 y.o. female  PRE-OPERATIVE DIAGNOSIS:  CESAREAN SECTION  POST-OPERATIVE DIAGNOSIS:  CESAREAN SECTION arrest of descent  PROCEDURE:  Procedure(s): CESAREAN SECTION (N/A)  SURGEON:  Surgeon(s) and Role:    Marlow Baars* Paydon Carll, MD - Primary  ANESTHESIA:   epidural  EBL:  870 mL  URINE OUTPUT: 150 mL  BLOOD ADMINISTERED:none  DRAINS: none   LOCAL MEDICATIONS USED:  NONE  SPECIMEN:  No Specimen  DISPOSITION OF SPECIMEN:  N/A  COUNTS:  YES  TOURNIQUET:  * No tourniquets in log *  DICTATION: .Note written in EPIC  PLAN OF CARE: Admit to inpatient   PATIENT DISPOSITION:  PACU - hemodynamically stable.   Delay start of Pharmacological VTE agent (>24hrs) due to surgical blood loss or risk of bleeding: not applicable

## 2017-01-21 NOTE — Progress Notes (Signed)
   01/21/17 0930  Oxygen Therapy  SpO2 (!) 84 %  O2 Device Room Air  Pt's pulse ox will decrease when patient is sleeping.  MD called for oxygen order via [place].  See new orders.  Will continue to monitor.

## 2017-01-21 NOTE — Progress Notes (Signed)
Patient progressed to 10/10/-1 along a normal labor curve.  She labored down for 2 hours with no improvement in station.  She has pushed for two hours with excellent effort and no fetal descent.   She developed a fever 30 minutes ago (but no maternal or fetal tachycardia).  She received 1gm PO tylenol and ampicillin and gentamicin have been ordered for presumed intra-amniotic infection (ampicillin infusion started prior to calling cesarean delivery but gentamicin was received prior to heading to OR).  At this time, fetal station remains -1 with caput developing.  I have recommended that we proceed with cesarean delivery.  We discussed risks to include infection, bleeding, damage to surrounding structures (including but not limited to bladder, bowel,ureter, tubes, ovaries, nerves, vessels, baby), need for additional procedures, vte, need for blood transfusion.  2 gm ampicillin on call to OR

## 2017-01-21 NOTE — Transfer of Care (Signed)
Immediate Anesthesia Transfer of Care Note  Patient: Bridget PilgrimYaneli Huffman  Procedure(s) Performed: Procedure(s): CESAREAN SECTION (N/A)  Patient Location: PACU  Anesthesia Type:Epidural  Level of Consciousness: awake, alert  and oriented  Airway & Oxygen Therapy: Patient Spontanous Breathing  Post-op Assessment: Report given to RN and Post -op Vital signs reviewed and stable  Post vital signs: Reviewed and stable  Last Vitals:  Vitals:   01/21/17 0500 01/21/17 0515  BP: 110/67 116/77  Pulse: 89 99  Resp:    Temp:      Last Pain:  Vitals:   01/21/17 0430  TempSrc: Oral  PainSc:       Patients Stated Pain Goal: 7 (01/20/17 1416)  Complications: No apparent anesthesia complications

## 2017-01-21 NOTE — Anesthesia Postprocedure Evaluation (Signed)
Anesthesia Post Note  Patient: Ananias PilgrimYaneli Rosales-Mendez  Procedure(s) Performed: Procedure(s) (LRB): CESAREAN SECTION (N/A)     Patient location during evaluation: PACU Anesthesia Type: Epidural Level of consciousness: oriented and awake and alert Pain management: pain level controlled Vital Signs Assessment: post-procedure vital signs reviewed and stable Respiratory status: spontaneous breathing, respiratory function stable and patient connected to nasal cannula oxygen Cardiovascular status: blood pressure returned to baseline and stable Postop Assessment: no headache, no backache, epidural receding and patient able to bend at knees Anesthetic complications: no    Last Vitals:  Vitals:   01/21/17 0759 01/21/17 0859  BP: 121/74 108/65  Pulse: 62 60  Resp: 18 18  Temp: 37.2 C 37.4 C  SpO2: 94% 93%    Last Pain:  Vitals:   01/21/17 0859  TempSrc: Oral  PainSc: 2    Pain Goal: Patients Stated Pain Goal: 2 (01/21/17 0859)               Shelton SilvasKevin D Pasco Marchitto

## 2017-01-21 NOTE — Anesthesia Postprocedure Evaluation (Signed)
Anesthesia Post Note  Patient: Bridget Huffman  Procedure(s) Performed: Procedure(s) (LRB): CESAREAN SECTION (N/A)     Patient location during evaluation: Mother Baby Anesthesia Type: Epidural Level of consciousness: awake and alert and oriented Pain management: satisfactory to patient Vital Signs Assessment: post-procedure vital signs reviewed and stable Respiratory status: respiratory function stable Cardiovascular status: stable Postop Assessment: no headache, no backache, epidural receding, patient able to bend at knees, no signs of nausea or vomiting and adequate PO intake Anesthetic complications: no    Last Vitals:  Vitals:   01/21/17 1123 01/21/17 1643  BP: 113/65   Pulse: 70 76  Resp: 18   Temp: 37.2 C 37.3 C  SpO2: 96% 96%    Last Pain:  Vitals:   01/21/17 1643  TempSrc: Oral  PainSc: 0-No pain   Pain Goal: Patients Stated Pain Goal: 2 (01/21/17 0859)               Karleen DolphinFUSSELL,Christofer Shen

## 2017-01-21 NOTE — Op Note (Signed)
Cesarean Section Procedure Note  PRE-OPERATIVE DIAGNOSIS:  CESAREAN SECTION  POST-OPERATIVE DIAGNOSIS:  CESAREAN SECTION arrest of descent  PROCEDURE:  Procedure(s): CESAREAN SECTION (N/A)  SURGEON:  Surgeon(s) and Role:    Marlow Baars* Sreekar Broyhill, MD - Primary  ANESTHESIA:   epidural  EBL:  870 mL  URINE OUTPUT: 150 mL  Complications:  None; patient tolerated the procedure well.         Disposition: PACU - hemodynamically stable.  Findings:  Normal uterus, tubes and ovaries bilaterally.  Viable female infant, 3725g (8lb 3oz) Apgars 9, 9.    Procedure Details   After epidural anesthesia was found to adequate , the patient was placed in the dorsal supine position with a leftward tilt, draped and prepped in the usual sterile manner. A Pfannenstiel incision was made and carried down through the subcutaneous tissue to the fascia. The fascia was incised in the midline and the fascial incision was extended laterally with Mayo scissors. The superior aspect of the fascial incision was grasped with two Kocher clamp, tented up and the rectus muscles dissected off sharply. The rectus was then dissected off with blunt dissection and Mayo scissors inferiorly. The rectus muscles were separated in the midline. The abdominal peritoneum was identified, tented up, entered bluntly, and the incision was extended superiorly and inferiorly with good visualization of the bladder. The Alexis retractor was deployed. The vesicouterine peritoneum was identified, tented up, entered sharply, and the bladder flap was created digitally. Scalpel was then used to make a low transverse incision on the uterus which was extended in the cephalad-caudad direction with blunt dissection. The fluid was clear. The fetal vertex was identified, elevated out of the pelvis and brought to the hysterotomy. The head was delivered easily followed by the shoulders and body. After a one minute delay per neonatology protocol, the cord was clamped and  cut and the infant was passed to the waiting neonatologist. Placenta was then delivered spontaneously, intact and appear normal, the uterus was cleared of all clot and debris   The hysterotomy was repaired with #0 Monocryl in running locked fashion. The uterus was boggy so one dose of 0.2mg  IM methergine was given.  A second imbricating layer of #0 Monocryl was placed. The hysterotomy was reexamined and excellent hemostasis was noted.  The uterine tone was significantly improved. The Alexis retractor was removed from the abdomen. The peritoneum was examined and all vessels noted to be hemostatic. The abdominal cavity was cleared of all clot and debris.  The peritoneum was closed with 2-0 vicryl in a running fashion. The rectus muscles were reapproximated with 2-0 vicryl in an interrupted fashion. The fascia and rectus muscles were inspected and were hemostatic. The fascia was closed with 0 Vicryl in a running fashion. The subcuticular layer was irrigated and all bleeders cauterized.  The subcutaneous layer was re approximated with interrupted 3-0 plain gut.  The skin was closed with 3-0 monocryl in a subcuticular fashion. The incision was dressed with benzoine, steri strips and honeycomb dressing. All sponge lap and needle counts were correct x3. Patient tolerated the procedure well and recovered in stable condition following the procedure.

## 2017-01-21 NOTE — Addendum Note (Signed)
Addendum  created 01/21/17 1723 by Graciela HusbandsFussell, Tiea Manninen O, CRNA   Charge Capture section accepted, Sign clinical note

## 2017-01-21 NOTE — Progress Notes (Signed)
ANTIBIOTIC CONSULT NOTE - INITIAL  Pharmacy Consult for Gentamicin Indication: Chorioamnionitis  No Known Allergies  Patient Measurements: Height: 5\' 5"  (165.1 cm) Weight: 211 lb (95.7 kg) IBW/kg (Calculated) : 57 Adjusted Body Weight: 68.6 kg  Vital Signs: Temp: 101.6 F (38.7 C) (08/13 0430) Temp Source: Oral (08/13 0430) BP: 125/70 (08/13 0430) Pulse Rate: 79 (08/13 0430) Intake/Output from previous day: 08/12 0701 - 08/13 0700 In: -  Out: 1625 [Urine:1625] Intake/Output from this shift: Total I/O In: -  Out: 1625 [Urine:1625]  Labs:  Recent Labs  01/20/17 1330  WBC 11.4*  HGB 13.3  PLT 182   CrCl cannot be calculated (Patient's most recent lab result is older than the maximum 21 days allowed.). No results for input(s): VANCOTROUGH, VANCOPEAK, VANCORANDOM, GENTTROUGH, GENTPEAK, GENTRANDOM, TOBRATROUGH, TOBRAPEAK, TOBRARND, AMIKACINPEAK, AMIKACINTROU, AMIKACIN in the last 72 hours.   Microbiology: No results found for this or any previous visit (from the past 720 hour(s)).  Medical History: Past Medical History:  Diagnosis Date  . Hx of preeclampsia, prior pregnancy, currently pregnant   . No pertinent past medical history   . Pregnancy induced hypertension    previous pregnancy    Medications:  Ampicillin 2 Gm IV x 1 dose  Assessment: 31 yo G2P1001 at 394w3d admitted 8/12 for delivery due to decreased fetal movement. Pt now with elevated temperature. Pharmacy has been asked to provide gentamicin dosing for presumed chorioamnionitis.  Goal of Therapy:  Gentamicin peaks 6-8 mcg/ml; troughs <1 mcg/ml  Plan:  Gentamicin 180 mg IV loading dose; Gentamicin maintenence dose 170 mg IV every 8 hours Monitor renal function per protocol Serum gentamicin levels as indicated by duration of therapy and clinical response  Arelia SneddonMason, Aman Batley Anne 01/21/2017,4:47 AM

## 2017-01-22 LAB — CBC
HCT: 29.4 % — ABNORMAL LOW (ref 36.0–46.0)
Hemoglobin: 10.2 g/dL — ABNORMAL LOW (ref 12.0–15.0)
MCH: 30.4 pg (ref 26.0–34.0)
MCHC: 34.7 g/dL (ref 30.0–36.0)
MCV: 87.8 fL (ref 78.0–100.0)
PLATELETS: 150 10*3/uL (ref 150–400)
RBC: 3.35 MIL/uL — ABNORMAL LOW (ref 3.87–5.11)
RDW: 14.4 % (ref 11.5–15.5)
WBC: 18.5 10*3/uL — ABNORMAL HIGH (ref 4.0–10.5)

## 2017-01-22 MED ORDER — RHO D IMMUNE GLOBULIN 1500 UNIT/2ML IJ SOSY
300.0000 ug | PREFILLED_SYRINGE | Freq: Once | INTRAMUSCULAR | Status: AC
  Administered 2017-01-22: 300 ug via INTRAVENOUS
  Filled 2017-01-22: qty 2

## 2017-01-22 NOTE — Lactation Note (Signed)
This note was copied from a baby's chart. Lactation Consultation Note: Mother is an experienced breastfeeding mother with first child for one year. Mother reports that she feel some pinching pain with infants latch. Observed that infant has a short anterior frenula and cups his tongue.  Mother assist with football position . Encouraged Mother to get infant deeply latched.  Infant latched using nipple to nose technique. Observed infant with rhythmic suckling and audible swallows. Mother denies painful latch, she reports only slight pinch. Taught mother to flange infants lips for wider gape.Infant sustained latch for 30 mins.  Infant transfers milk well. Mother taught to hand express large drops of colostrum. Mother has been using cradle hold. Advised mother in cross cradle hold. Encouraged mother to use good support. Mother was given a hand pump with a #27 flange to use as needed. Reviewed basics of breastfeeding with mother. [facility] and informed of all available LC services. Mother receptive to all teaching.   Patient Name: Boy Ananias PilgrimYaneli Rosales-Mendez ZOXWR'UToday's Date: 01/22/2017 Reason for consult: Initial assessment   Maternal Data    Feeding Feeding Type: Breast Fed Length of feed: 15 min  LATCH Score                   Interventions    Lactation Tools Discussed/Used     Consult Status      Michel BickersKendrick, Rosa Wyly McCoy 01/22/2017, 10:08 AM

## 2017-01-22 NOTE — Progress Notes (Signed)
POD#1 Pt without complaints.Lochia wnl VSSAF IMP/ stable Plan/ routine care.

## 2017-01-23 LAB — RH IG WORKUP (INCLUDES ABO/RH)
ABO/RH(D): AB NEG
Fetal Screen: NEGATIVE
GESTATIONAL AGE(WKS): 40.3
Unit division: 0

## 2017-01-23 MED ORDER — OXYCODONE-ACETAMINOPHEN 5-325 MG PO TABS: 1.0000 | ORAL_TABLET | ORAL | 0 refills | Status: DC | PRN

## 2017-01-23 MED ORDER — PRENATAL VITAMIN AND MINERAL 28-0.8 MG PO TABS: 1.0000 | ORAL_TABLET | Freq: Every day | ORAL | 11 refills | Status: DC

## 2017-01-23 MED ORDER — COMPLETENATE 29-1 MG PO CHEW
1.0000 | CHEWABLE_TABLET | Freq: Every day | ORAL | Status: DC
  Filled 2017-01-23: qty 1

## 2017-01-23 MED ORDER — DOCUSATE SODIUM 100 MG PO CAPS: 100.0000 mg | ORAL_CAPSULE | Freq: Two times a day (BID) | ORAL | 0 refills | Status: DC

## 2017-01-23 MED ORDER — IBUPROFEN 600 MG PO TABS: 600.0000 mg | ORAL_TABLET | Freq: Four times a day (QID) | ORAL | 0 refills | Status: DC | PRN

## 2017-01-23 NOTE — Discharge Summary (Signed)
Obstetric Discharge Summary Reason for Admission: decreased fetal movement Prenatal Procedures: NST and ultrasound Intrapartum Procedures: cesarean: low cervical, transverse Postpartum Procedures: none Complications-Operative and Postpartum: none Hemoglobin  Date Value Ref Range Status  01/22/2017 10.2 (L) 12.0 - 15.0 g/dL Final    Comment:    DELTA CHECK NOTED REPEATED TO VERIFY    HCT  Date Value Ref Range Status  01/22/2017 29.4 (L) 36.0 - 46.0 % Final    Physical Exam:  General: alert, cooperative and appears stated age 81Lochia: appropriate Uterine Fundus: firm Incision: healing well DVT Evaluation: No evidence of DVT seen on physical exam.  Discharge Diagnoses: Term Pregnancy-delivered  Discharge Information: Date: 01/23/2017 Activity: pelvic rest Diet: routine Medications: Ibuprofen, Colace and Percocet Condition: improved Instructions: refer to practice specific booklet Discharge to: home Follow-up Information    Marlow Baarslark, Dyanna, MD Follow up in 4 week(s).   Specialty:  Obstetrics Why:  For a postpartum evaluation Contact information: [address] [place] [id number] [phone]           Newborn Data: Live born female  Birth Weight: 8 lb 3.4 oz (3725 g) APGAR: 9, 9  Home with mother.  Marney Treloar H. 01/23/2017, 5:44 PM

## 2017-01-23 NOTE — Discharge Instructions (Signed)

## 2017-01-23 NOTE — Plan of Care (Signed)
Problem: Education: Goal: Knowledge of condition will improve Discharge education, safety, incision care, and follow up reviewed with patient and significant other.

## 2017-01-23 NOTE — Lactation Note (Addendum)
This note was copied from a baby's chart. Lactation Consultation Note  Patient Name: Bridget Huffman ZOXWR'UToday's Date: 01/23/2017 Reason for consult: Follow-up assessment;Difficult latch;Nipple pain/trauma  Baby 52 hours old. Mom reports that she does not believe baby getting enough at the breast. [facility] nursing [facility]. Assisted mom with positioning and baby able to maintain a deep latch with a few swallows noted. Mom's nipples are red and mom reports soreness. Mom states that she had the same issues with first child. Baby's lingual frenulum appears tight--baby not able to lateralize of lift tongue well. Discussed with mom that latch may improve when her milk comes to volume. Mom states that her milk came in slowly with first child--after day 5, and she brought first child for 2 follow-up outpatient visits with Lactation. Then, baby continued to nurse and receive formula by bottle for a year.   Assisted mom with hand expression and use of manual pump and collected 1 ml of EBM which was given to baby with curve-tipped syringe--and baby tolerated well. Set mom up with DEBP and enc mom to pump and then hand express. Discussed EBM storage guidelines and reviewed use of comfort gels between feedings. Plan is for mom to put baby to breast with cues, then supplement with EBM/formula according to guidelines, which were given with review. Enc mom to post-pump followed by hand expression. [facility], and will call for assistance as needed.    Burnis KingfisherL. Rafeek, NP and Judeth CornfieldStephanie, RN aware of assessment and interventions.   Maternal Data    Feeding Feeding Type: Breast Fed Length of feed: 40 min  LATCH Score Latch: Grasps breast easily, tongue down, lips flanged, rhythmical sucking.  Audible Swallowing: A few with stimulation  Type of Nipple: Everted at rest and after stimulation  Comfort (Breast/Nipple): Filling, red/small blisters or bruises, mild/mod  discomfort        Interventions Interventions: Assisted with latch;Hand express;Breast compression;Adjust position;Support pillows;Position options;Expressed milk;Comfort gels;DEBP  Lactation Tools Discussed/Used Tools: Comfort gels;Pump Breast pump type: Double-Electric Breast Pump;Manual WIC Program: Yes Pump Review: Setup, frequency, and cleaning;Milk Storage Initiated by:: JW Date initiated:: 01/23/17   Consult Status Consult Status: Follow-up Date: 01/23/17 Follow-up type: In-patient    Bridget HayJennifer D Mihcael Huffman 01/23/2017, 10:44 AM

## 2017-01-23 NOTE — Lactation Note (Signed)
This note was copied from a baby's chart. Mother requests formula for baby because she feels that she "doesnt have any milk". Educated mother with LEAD and reinforced continued breastfeeding.

## 2017-01-23 NOTE — Lactation Note (Addendum)
This note was copied from a baby's chart. Lactation Consultation Note  Patient Name: Boy Ananias PilgrimYaneli Rosales-Mendez ZOXWR'UToday's Date: 01/23/2017   [facility]. Mom states that she is seeing more EBM after baby nursing. Enc mom to follow supplementation guidelines and give formula if EBM not enough per guidelines. Mom reports understanding.  Maternal Data    Feeding Feeding Type: Breast Fed Length of feed: 10 min  LATCH Score                   Interventions    Lactation Tools Discussed/Used     Consult Status      Sherlyn HayJennifer D Chellsea Beckers 01/23/2017, 4:16 PM

## 2017-04-15 ENCOUNTER — Encounter (HOSPITAL_COMMUNITY): Payer: Self-pay

## 2019-12-13 ENCOUNTER — Emergency Department (HOSPITAL_COMMUNITY)
Admission: EM | Admit: 2019-12-13 | Discharge: 2019-12-14 | Disposition: A | Discharge status: Home / Self Care | Payer: Self-pay | Attending: Emergency Medicine | Admitting: Emergency Medicine

## 2019-12-13 ENCOUNTER — Emergency Department (HOSPITAL_COMMUNITY): Payer: Self-pay

## 2019-12-13 ENCOUNTER — Other Ambulatory Visit: Payer: Self-pay

## 2019-12-13 ENCOUNTER — Encounter (HOSPITAL_COMMUNITY): Payer: Self-pay | Admitting: Emergency Medicine

## 2019-12-13 DIAGNOSIS — Z79899 Other long term (current) drug therapy: Secondary | ICD-10-CM | POA: Insufficient documentation

## 2019-12-13 DIAGNOSIS — Z23 Encounter for immunization: Secondary | ICD-10-CM | POA: Insufficient documentation

## 2019-12-13 DIAGNOSIS — Y929 Unspecified place or not applicable: Secondary | ICD-10-CM | POA: Insufficient documentation

## 2019-12-13 DIAGNOSIS — W260XXA Contact with knife, initial encounter: Secondary | ICD-10-CM | POA: Insufficient documentation

## 2019-12-13 DIAGNOSIS — Y93G1 Activity, food preparation and clean up: Secondary | ICD-10-CM | POA: Insufficient documentation

## 2019-12-13 DIAGNOSIS — S61215A Laceration without foreign body of left ring finger without damage to nail, initial encounter: Secondary | ICD-10-CM | POA: Insufficient documentation

## 2019-12-13 DIAGNOSIS — Y999 Unspecified external cause status: Secondary | ICD-10-CM | POA: Insufficient documentation

## 2019-12-13 DIAGNOSIS — S66812A Strain of other specified muscles, fascia and tendons at wrist and hand level, left hand, initial encounter: Secondary | ICD-10-CM | POA: Insufficient documentation

## 2019-12-13 MED ORDER — ONDANSETRON HCL 4 MG/2ML IJ SOLN
4.0000 mg | Freq: Once | INTRAMUSCULAR | Status: AC
  Administered 2019-12-13: 4 mg via INTRAVENOUS
  Filled 2019-12-13: qty 2

## 2019-12-13 MED ORDER — LIDOCAINE HCL (PF) 1 % IJ SOLN
INTRAMUSCULAR | Status: AC
  Filled 2019-12-13: qty 30

## 2019-12-13 MED ORDER — LIDOCAINE HCL (PF) 1 % IJ SOLN
5.0000 mL | Freq: Once | INTRAMUSCULAR | Status: AC
  Administered 2019-12-14: 5 mL via INTRADERMAL

## 2019-12-13 MED ORDER — TETANUS-DIPHTH-ACELL PERTUSSIS 5-2.5-18.5 LF-MCG/0.5 IM SUSP
0.5000 mL | Freq: Once | INTRAMUSCULAR | Status: AC
  Administered 2019-12-13: 0.5 mL via INTRAMUSCULAR
  Filled 2019-12-13: qty 0.5

## 2019-12-13 MED ORDER — MORPHINE SULFATE (PF) 4 MG/ML IV SOLN
4.0000 mg | Freq: Once | INTRAVENOUS | Status: AC
  Administered 2019-12-13: 4 mg via INTRAVENOUS
  Filled 2019-12-13: qty 1

## 2019-12-13 NOTE — ED Provider Notes (Signed)
[facility] EMERGENCY DEPARTMENT Provider Note   CSN: [id number] Arrival date & time: 12/13/19  2101     History Chief Complaint  Patient presents with  . Laceration    Bridget Huffman is a 34 y.o. right-hand-dominant female with noncontributory past medical history.  HPI Patient presents to the emergency department today via EMS with chief complaint of laceration to left hand happening approximately 1.5 hours prior to arrival.  Patient states she was trying to remove the pit from an avocado using a sharp knife and the knife accidentally slipped and sliced her left palm.  She had sudden onset of severe pain.  She states the pain is 9/10 in severity.  She is describing the pain as sharp throbbing sensation.  The pain is constant.  No medications for symptoms prior to arrival. She denies fever, chills, arthralgias, numbness, tingling, weakness. Tetanus is not up to date.  LMP ended x2 days ago. She denies chance of pregnancy. Last PO intake 6pm this evening.    Past Medical History:  Diagnosis Date  . Hx of preeclampsia, prior pregnancy, currently pregnant   . No pertinent past medical history   . Pregnancy induced hypertension    previous pregnancy    Patient Active Problem List   Diagnosis Date Noted  . Decreased fetal movement 01/20/2017  . Perforated appendicitis 06/01/2014  . Acute appendicitis 05/31/2014  . Annual physical exam 02/09/2014  . Pap smear for cervical cancer screening 02/09/2014  . NSVD (normal spontaneous vaginal delivery) 03/29/2012  . Mild preeclampsia delivered 03/29/2012  . GBS (group B streptococcus) UTI complicating pregnancy 08/21/2011    Past Surgical History:  Procedure Laterality Date  . APPENDECTOMY    . CESAREAN SECTION N/A 01/21/2017   Procedure: CESAREAN SECTION;  Surgeon: Marlow Baars, MD;  Location: [facility] BIRTHING SUITES;  Service: Obstetrics;  Laterality: N/A;  . LAPAROSCOPIC APPENDECTOMY N/A 05/31/2014   Procedure:  APPENDECTOMY LAPAROSCOPIC;  Surgeon: Axel Filler, MD;  Location: [facility] OR;  Service: General;  Laterality: N/A;     OB History    Gravida  2   Para  1   Term  1   Preterm  0   AB  0   Living  1     SAB  0   TAB  0   Ectopic  0   Multiple  0   Live Births  1           Family History  Problem Relation Age of Onset  . Hypertension Mother   . Alcohol abuse Neg Hx   . Arthritis Neg Hx   . Asthma Neg Hx   . Birth defects Neg Hx   . Cancer Neg Hx   . COPD Neg Hx   . Depression Neg Hx   . Diabetes Neg Hx   . Drug abuse Neg Hx   . Early death Neg Hx   . Hearing loss Neg Hx   . Heart disease Neg Hx   . Hyperlipidemia Neg Hx   . Learning disabilities Neg Hx   . Kidney disease Neg Hx   . Mental illness Neg Hx   . Mental retardation Neg Hx   . Miscarriages / Stillbirths Neg Hx   . Stroke Neg Hx   . Vision loss Neg Hx     Social History   Tobacco Use  . Smoking status: Never Smoker  . Smokeless tobacco: Never Used  Substance Use Topics  . Alcohol use: No  . Drug  use: No    Home Medications Prior to Admission medications   Medication Sig Start Date End Date Taking? Authorizing Provider  acetaminophen (TYLENOL) 325 MG tablet Take 650 mg by mouth every 6 (six) hours as needed for mild pain.   Yes [provider]  Multiple Vitamin (MULTIVITAMIN WITH MINERALS) TABS tablet Take 1 tablet by mouth daily.   Yes [provider]  docusate sodium (COLACE) 100 MG capsule Take 1 capsule (100 mg total) by mouth 2 (two) times daily. Patient not taking: Reported on 12/13/2019 01/23/17   Waynard Reeds, MD  ibuprofen (ADVIL,MOTRIN) 600 MG tablet Take 1 tablet (600 mg total) by mouth every 6 (six) hours as needed. Patient not taking: Reported on 12/13/2019 01/23/17   Waynard Reeds, MD  oxyCODONE-acetaminophen (ROXICET) 5-325 MG tablet Take 1-2 tablets by mouth every 4 (four) hours as needed for severe pain. Patient not taking: Reported on 12/13/2019 01/23/17   Waynard Reeds, MD  Prenatal Vit-Fe Fumarate-FA (PRENATAL VITAMIN AND MINERAL) 28-0.8 MG TABS Take 1 tablet by mouth daily. Patient not taking: Reported on 12/13/2019 01/23/17   Waynard Reeds, MD    Allergies    Patient has no known allergies.  Review of Systems   Review of Systems  All other systems are reviewed and are negative for acute change except as noted in the HPI.   Physical Exam Updated Vital Signs BP 121/78   Pulse 69   Temp 98.4 F (36.9 C) (Oral)   Resp 17   LMP 11/29/2019   SpO2 99%   Physical Exam Vitals and nursing note reviewed.  Constitutional:      General: She is not in acute distress.    Appearance: She is not ill-appearing.  HENT:     Head: Normocephalic and atraumatic.     Right Ear: Tympanic membrane and external ear normal.     Left Ear: Tympanic membrane and external ear normal.     Nose: Nose normal.     Mouth/Throat:     Mouth: Mucous membranes are moist.     Pharynx: Oropharynx is clear.  Eyes:     General: No scleral icterus.       Right eye: No discharge.        Left eye: No discharge.     Extraocular Movements: Extraocular movements intact.     Conjunctiva/sclera: Conjunctivae normal.     Pupils: Pupils are equal, round, and reactive to light.  Neck:     Vascular: No JVD.  Cardiovascular:     Rate and Rhythm: Normal rate and regular rhythm.     Pulses: Normal pulses.          Radial pulses are 2+ on the right side and 2+ on the left side.     Heart sounds: Normal heart sounds.  Pulmonary:     Comments: Lungs clear to auscultation in all fields. Symmetric chest rise. No wheezing, rales, or rhonchi. Abdominal:     Comments: Abdomen is soft, non-distended, and non-tender in all quadrants. No rigidity, no guarding. No peritoneal signs.  Musculoskeletal:     Cervical back: Normal range of motion.     Comments: 6 cm laceration on palm of left hand extending to base of 4th finger. Wedding ring still on 4th finger on ED arrival Active bleeding able  to be controlled with pressure. Sensation is intact. Pain with movement of fingers.   Strength intact of DIP against resistance. Unable to flex MCP, ? Due to pain or tendon  injury  Skin:    General: Skin is warm and dry.     Capillary Refill: Capillary refill takes less than 2 seconds.  Neurological:     Mental Status: She is oriented to person, place, and time.     GCS: GCS eye subscore is 4. GCS verbal subscore is 5. GCS motor subscore is 6.     Comments: Fluent speech, no facial droop.  Psychiatric:        Behavior: Behavior normal.     ED Results / Procedures / Treatments   Labs (all labs ordered are listed, but only abnormal results are displayed) Labs Reviewed - No data to display  EKG None  Radiology DG Hand Complete Left  Result Date: 12/13/2019 CLINICAL DATA:  Laceration while cutting avocado EXAM: LEFT HAND - COMPLETE 3+ VIEW COMPARISON:  None. FINDINGS: Soft tissue swelling along the volar aspect of the third and fourth digits with a focus of soft tissue gas visualized on lateral radiograph. No radiopaque foreign bodies aside from the metal band on the fourth digit. Overlying bandaging material is present. No subjacent osseous defects or acute traumatic osseous injury or malalignment. Normal bone mineralization. No worrisome osseous lesions. Remaining soft tissues are unremarkable. IMPRESSION: Soft tissue swelling and gas along the volar aspect of the third and fourth digits. Correlate with site of injury. No acute osseous abnormality No radiopaque foreign body aside from the metal band on the fourth digit. Electronically Signed   By: Kreg Shropshire M.D.   On: 12/13/2019 22:36    Procedures Procedures (including critical care time)  Medications Ordered in ED Medications  lidocaine (PF) (XYLOCAINE) 1 % injection (has no administration in time range)  lidocaine (PF) (XYLOCAINE) 1 % injection 5 mL (has no administration in time range)  ceFAZolin (ANCEF) IVPB 2g/100 mL premix  (has no administration in time range)  morphine 4 MG/ML injection 4 mg (4 mg Intravenous Given 12/13/19 2212)  ondansetron (ZOFRAN) injection 4 mg (4 mg Intravenous Given 12/13/19 2212)  Tdap (BOOSTRIX) injection 0.5 mL (0.5 mLs Intramuscular Given 12/13/19 2213)    ED Course  I have reviewed the triage vital signs and the nursing notes.  Pertinent labs & imaging results that were available during my care of the patient were reviewed by me and considered in my medical decision making (see chart for details).    MDM Rules/Calculators/A&P                          History provided by patient with additional history obtained from chart review.    Patient presents to the emergency department with laceration to left palm extending to 4th finger which occurred within 1.5 hours PTA. Patient nontoxic appearing,she is uncomfortable appearing, crying out in pain on arrival. She has a ring on that finger.  She does have strength intact against resistance of DIP.  She is unable to flex MCP however does appear to be in severe pain, unsure if lack of flexion is secondary to pain or tendon injury.  x-ray obtained in area of laceration, no fractures/dislocations or apparent radiopaque foreign bodies. Tetanus updated. She denies chance of  Pregnancy, menses just ended x 2 days ago.  Patient care transferred to S. Upstill PA-C at the end of my shift pending lac repair and reassessment of possible tendon injury. Patient presentation, ED course, and plan of care discussed with review of all pertinent labs and imaging. Please see her note for further details  regarding further ED course and disposition.   Portions of this note were generated with Scientist, clinical (histocompatibility and immunogenetics)Dragon dictation software. Dictation errors may occur despite best attempts at proofreading.   Final Clinical Impression(s) / ED Diagnoses Final diagnoses:  Laceration of left ring finger without foreign body without damage to nail, initial encounter    Rx / DC Orders ED  Discharge Orders    None       Kathyrn Lasslbrizze, Desma Wilkowski E, PA-C 12/14/19 0026    Blane OharaZavitz, Joshua, MD 12/16/19 [phone]

## 2019-12-13 NOTE — ED Triage Notes (Signed)
Pt here from home via [facility] for lac to L hand. Pt was cooking dinner and was attempting to get the pit out of an avocado and cut into her palm. Per EMS cut is deep, bleeding controlled. C/0 9/10 hand pain, aox4, vss.

## 2019-12-14 MED ORDER — CEFAZOLIN SODIUM-DEXTROSE 2-4 GM/100ML-% IV SOLN
2.0000 g | Freq: Once | INTRAVENOUS | Status: AC
  Administered 2019-12-14: 2 g via INTRAVENOUS
  Filled 2019-12-14: qty 100

## 2019-12-14 MED ORDER — OXYCODONE-ACETAMINOPHEN 5-325 MG PO TABS: 1.0000 | ORAL_TABLET | ORAL | 0 refills | Status: DC | PRN

## 2019-12-14 MED ORDER — ONDANSETRON 4 MG PO TBDP
4.0000 mg | ORAL_TABLET | Freq: Once | ORAL | Status: DC
  Filled 2019-12-14: qty 1

## 2019-12-14 MED ORDER — ONDANSETRON 4 MG PO TBDP
4.0000 mg | ORAL_TABLET | Freq: Three times a day (TID) | ORAL | 0 refills | Status: DC | PRN
Start: 2019-12-14 — End: 2020-06-06

## 2019-12-14 MED ORDER — BACITRACIN ZINC 500 UNIT/GM EX OINT: TOPICAL_OINTMENT | Freq: Once | CUTANEOUS | Status: DC

## 2019-12-14 MED ORDER — CEPHALEXIN 500 MG PO CAPS
500.0000 mg | ORAL_CAPSULE | Freq: Four times a day (QID) | ORAL | 0 refills | Status: DC
Start: 2019-12-14 — End: 2020-04-26

## 2019-12-14 NOTE — ED Provider Notes (Signed)
Patient signed out from [facility], PA-C, for laceration repair and hand consultation. Patient cutting an avocado around 7:30, the knife slipped causing palmar laceration to her left hand. She is right hand dominant.   LACERATION REPAIR Performed by: Arnoldo Hooker Authorized by: Arnoldo Hooker Consent: Verbal consent obtained. Risks and benefits: risks, benefits and alternatives were discussed Consent given by: patient Patient identity confirmed: provided demographic data Prepped and Draped in normal sterile fashion Wound explored  Laceration Location: left palmar hand 4th MCP into base of finger  Laceration Length: 5cm  No Foreign Bodies seen or palpated in the wound. Ring to 4th finger removed after anesthetic   Anesthesia: local infiltration  Local anesthetic: lidocaine 1% w/o epinephrine  Anesthetic total: 8 ml  Irrigation method: syringe Amount of cleaning: standard  Skin closure: 5-0 prolene  Number of sutures: simple interrupted  Technique: 10  Patient tolerance: Patient tolerated the procedure well with no immediate complications.  Re-examination after ring removal: no ability for flexion of DIP, PIP, IP joints of 4 finger. (Neurovascularly intact prior to numbing.)   Discussed with Dr. Aundria Rud (hand ortho) who advises the laceration can be closed, recommended dorsal blocking splint and office follow up with either Dr. Roney Mans or Dr. Melvyn Novas in 2 days. 2 gms Ancep given IV, home with Rx Keflex 500 mg QID x 5 days. Pain medication.   Discussed care and follow up with the patient who is comfortable with office follow up as planned.      Elpidio Anis, PA-C 12/14/19 0125    Blane Ohara, MD 12/16/19 [phone]

## 2019-12-14 NOTE — Discharge Instructions (Signed)
You will need follow up with a hand surgeon. Call Dr. Hinda Glatter office Tuesday morning when they re-open after the holiday weekend and let them know the on-call doctor tonight, Dr. Aundria Rud, instructed you to be seen Tuesday by Dr. Roney Mans, or Dr. Melvyn Novas, for further management of a flexor tendon rupture of your hand.   Take medications as prescribed. Return to the emergency department if you develop any new or concerning symptoms before being able to follow up with orthopedics.

## 2019-12-14 NOTE — ED Notes (Signed)
Wound care performed and pt splinted by ortho tech

## 2019-12-14 NOTE — ED Notes (Signed)
Patient verbalizes understanding of discharge instructions. Opportunity for questioning and answers were provided. Armband removed by staff, pt discharged from ED. Pt. ambulatory and discharged home.  

## 2019-12-14 NOTE — Progress Notes (Signed)
Orthopedic Tech Progress Note Patient Details:  Bridget Huffman [birth year] [id number]  Ortho Devices Type of Ortho Device: Arm sling, Other (comment) Ortho Device/Splint Location: dorsal splint. applied as requested by the dr. Gaylord Shih Device/Splint Interventions: Ordered, Application, Adjustment   Post Interventions Patient Tolerated: Well Instructions Provided: Care of device, Adjustment of device   Trinna Post 12/14/2019, 1:50 AM

## 2019-12-15 DIAGNOSIS — M79645 Pain in left finger(s): Secondary | ICD-10-CM | POA: Insufficient documentation

## 2019-12-21 DIAGNOSIS — S61412A Laceration without foreign body of left hand, initial encounter: Secondary | ICD-10-CM | POA: Insufficient documentation

## 2019-12-29 DIAGNOSIS — Z4789 Encounter for other orthopedic aftercare: Secondary | ICD-10-CM | POA: Insufficient documentation

## 2019-12-30 ENCOUNTER — Other Ambulatory Visit: Payer: Self-pay

## 2019-12-30 ENCOUNTER — Ambulatory Visit: Payer: Medicaid Other | Attending: Orthopedic Surgery | Admitting: Occupational Therapy

## 2019-12-30 DIAGNOSIS — M25642 Stiffness of left hand, not elsewhere classified: Secondary | ICD-10-CM | POA: Insufficient documentation

## 2019-12-30 DIAGNOSIS — M6281 Muscle weakness (generalized): Secondary | ICD-10-CM

## 2019-12-30 DIAGNOSIS — M79642 Pain in left hand: Secondary | ICD-10-CM

## 2019-12-30 NOTE — Therapy (Signed)
[facility] [address] [place], [place], [address] Phone: [phone]   Fax:  [phone]  Occupational Therapy Evaluation  Patient Details  Name: Bridget Huffman MRN: [id number] Date of Birth: [birth year] Referring Provider (OT): Dr. Melvyn Novas   Encounter Date: 12/30/2019   OT End of Session - 12/30/19 1202    Visit Number 1    Number of Visits 16    Date for OT Re-Evaluation 03/29/20    Authorization Type Medicaid pending    Authorization Time Period 1x week x 3 weeks followed by 2x week x 6 weeks    OT Start Time 1020    OT Stop Time 1140    OT Time Calculation (min) 80 min    Activity Tolerance Patient tolerated treatment well    Behavior During Therapy [facility] for tasks assessed/performed           Past Medical History:  Diagnosis Date  . Hx of preeclampsia, prior pregnancy, currently pregnant   . No pertinent past medical history   . Pregnancy induced hypertension    previous pregnancy    Past Surgical History:  Procedure Laterality Date  . APPENDECTOMY    . CESAREAN SECTION N/A 01/21/2017   Procedure: CESAREAN SECTION;  Surgeon: Marlow Baars, MD;  Location: [facility] BIRTHING SUITES;  Service: Obstetrics;  Laterality: N/A;  . LAPAROSCOPIC APPENDECTOMY N/A 05/31/2014   Procedure: APPENDECTOMY LAPAROSCOPIC;  Surgeon: Axel Filler, MD;  Location: [facility] OR;  Service: General;  Laterality: N/A;    There were no vitals filed for this visit.   Subjective Assessment - 12/30/19 1040    Subjective  Pt cut her hand    Pertinent History Zone 2 flexor tendon repair, digital n. repair LUE    Limitations follow Zone 2 flexor tendon protocol    Patient Stated Goals regain  use of hand    Currently in Pain? Yes    Pain Score 3     Pain Location Hand    Pain Orientation Left    Pain Descriptors / Indicators Aching    Pain Type Surgical pain;Acute pain    Pain Onset 1 to 4 weeks ago    Pain Frequency Intermittent      Aggravating Factors  movement, touching hand    Pain Relieving Factors rest             [facility] OT Assessment - 12/30/19 1211      Assessment   Medical Diagnosis zone 2 flexor tendon repair, radial digital n. repair    Referring Provider (OT) Dr. Melvyn Novas    Onset Date/Surgical Date 12/21/19    Hand Dominance Right      Precautions   Precautions Other (comment)    Precaution Comments splint at all times except for hand hygeine, follow Zone 2 flexor tendon repair protocol      Home  Environment   Family/patient expects to be discharged to: Private residence    Lives With Spouse;Family      Prior Function   Level of Independence Independent    Vocation Full time employment    Vocation Requirements tax office      ADL   ADL comments Pt is currently using her right dominant hand to perform ADLs.      Mobility   Mobility Status Independent      Written Expression   Dominant Hand Right      Cognition   Overall Cognitive Status Within Functional Limits for tasks assessed  Edema   Edema mild in left hand, stitches in place over digital and palmar incision, no s/s of infection.       ROM / Strength   AROM / PROM / Strength AROM      AROM   Overall AROM  Unable to assess;Due to precautions                    OT Treatments/Exercises (OP) - 12/30/19 0001      Splinting   Splinting Pt arrived wearing post surgical cast. Cast was removed and hand was cleaned with soap and water while avoiding wrist and digital extension. Pt's hand was dressed with finger stockinette and stockinette over hand. Pt was fitted with a doral block splint with MP's in approximately 55* flexion. Pt was instructed in inital HEP for P/ROM within dorsal blocking splint. Pt returned demonstration.                 OT Education - 12/30/19 1228    Education Details splint wear, care and precautions, hand hygeine, P/ROM within dorsal blocking splint    Person(s) Educated Patient     Methods Explanation;Demonstration;Verbal cues;Handout    Comprehension Verbalized understanding;Returned demonstration;Verbal cues required            OT Short Term Goals - 12/30/19 1223      OT SHORT TERM GOAL #1   Title I with splint wear care and precautions following 1-2 weeks of wear to ensure proper fit.    Baseline dependent splint issued on inital visit    Time 4    Period Weeks    Status New      OT SHORT TERM GOAL #2   Title I with inital HEP    Baseline dependent    Time 4    Period Weeks    Status New      OT SHORT TERM GOAL #3   Title I with scar massage and edema control prn.    Baseline dependent    Time 4    Period Weeks    Status New             OT Long Term Goals - 12/30/19 1223      OT LONG TERM GOAL #1   Title I with updated HEP.    Baseline dependent    Time 10    Period Weeks    Status New      OT LONG TERM GOAL #2   Title Pt will resume use of LUE as a non dominant assist at least 90% of the time for ADLs/ IADLs  with pain less than or equal to 2/10.    Baseline unable to use due to precautions, pain 3/10    Time 10    Period Weeks    Status New      OT LONG TERM GOAL #3   Title Pt will demonstrate grip strength of at least 25 lbs for increased functional use during ADLs    Baseline not tested due to precautions    Time 10    Period Weeks    Status New      OT LONG TERM GOAL #4   Title Pt will demonstrate at least 95% composite flexion and extension for increased functional use.    Baseline unable to perform due to precautions    Time 10    Period Weeks    Status New  Plan - 12/30/19 1203    Clinical Impression Statement Pt is 34 y.o female  s/p laceration of right hand while cutting an avacado. Pt underwent zone 2 flexor tendon repair(FDS and FDP) to ring and small fingers and left radial digital nerve repair on 12/21/19 by Dr. Melvyn Novas. Pt presents with the following deficits: decreased strength,  decreased ROM, pain, decreased functional use, which impedes performance of ADLs/ IADLs. Pt can benefit from skilled occupational therapy to maximize pt's safety and I with ADLs/ IADLs. Pt was completely I prior to injury. 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    OT Occupational Profile and History Problem Focused Assessment - Including review of records relating to presenting problem    Occupational performance deficits (Please refer to evaluation for details): ADL's;IADL's;Work;Leisure;Social Participation    Body Structure / Function / Physical Skills ADL;Edema;Skin integrity;Strength;Endurance;UE functional use;Flexibility;FMC;Pain;Coordination;Decreased knowledge of precautions;GMC;ROM;Wound;Dexterity;IADL;Sensation    Rehab Potential Good    Clinical Decision Making Limited treatment options, no task modification necessary    Comorbidities Affecting Occupational Performance: None    Modification or Assistance to Complete Evaluation  No modification of tasks or assist necessary to complete eval    OT Frequency --   1x week x 3 weeks followed by 2x week x 6 weeks or 15 visits total plus eval   OT Duration --    OT Treatment/Interventions Self-care/ADL training;Therapeutic exercise;Functional Mobility Training;Ultrasound;Neuromuscular education;Manual Therapy;Splinting;Therapeutic activities;Iontophoresis;Energy conservation;Cryotherapy;Paraffin;DME and/or AE instruction;Fluidtherapy;Patient/family education;Passive range of motion;Contrast Bath;Moist Heat;Electrical Stimulation    Plan review HEP(P/ROM in dorsal block), splint check    Consulted and Agree with Plan of Care Patient           Patient will benefit from skilled therapeutic intervention in order to improve the following deficits and impairments:   Body Structure / Function / Physical Skills: ADL, Edema, Skin integrity, Strength, Endurance, UE functional use, Flexibility, FMC, Pain, Coordination, Decreased knowledge  of precautions, GMC, ROM, Wound, Dexterity, IADL, Sensation       Visit Diagnosis: Pain in left hand - Plan: Ot plan of care cert/re-cert  Muscle weakness (generalized) - Plan: Ot plan of care cert/re-cert  Stiffness of left hand, not elsewhere classified - Plan: Ot plan of care cert/re-cert    Problem List Patient Active Problem List   Diagnosis Date Noted  . Decreased fetal movement 01/20/2017  . Perforated appendicitis 06/01/2014  . Acute appendicitis 05/31/2014  . Annual physical exam 02/09/2014  . Pap smear for cervical cancer screening 02/09/2014  . NSVD (normal spontaneous vaginal delivery) 03/29/2012  . Mild preeclampsia delivered 03/29/2012  . GBS (group B streptococcus) UTI complicating pregnancy 08/21/2011    Bridget Huffman 12/30/2019, 12:50 PM Keene Breath, OTR/L [phone] Phone: [phone] 12:50 PM 12/30/19 [facility] [address] [place], [place], [address] Phone: [phone]   Fax:  [phone]  Name: Bridget Huffman MRN: [id number] Date of Birth: [birth year]

## 2019-12-30 NOTE — Patient Instructions (Signed)
WEARING SCHEDULE:  Wear splint at ALL times except for hygiene care Do not submerge your hand in water! PURPOSE:  To prevent movement and for protection until injury can heal  CARE OF SPLINT:  Keep splint away from heat sources including: stove, radiator or furnace, or a car in sunlight. The splint can melt and will no longer fit you properly  Keep away from pets and children  Clean the splint with rubbing alcohol 1-2 times per day.  * During this time, make sure you also clean your hand/arm as instructed by your therapist and/or perform dressing changes as needed. Then dry hand/arm completely before replacing splint. (When cleaning hand/arm, keep it immobilized in same position until splint is replaced)  PRECAUTIONS/POTENTIAL PROBLEMS: *If you notice or experience increased pain, swelling, numbness, or a lingering reddened area from the splint: Contact your therapist immediately by calling [phone]. You must wear the splint for protection, but we will get you scheduled for adjustments as quickly as possible.  (If only straps or hooks need to be replaced and NO adjustments to the splint need to be made, just call the office ahead and let them know you are coming in)  If you have any medical concerns or signs of infection, please call your doctor immediately

## 2020-01-07 ENCOUNTER — Ambulatory Visit: Payer: Medicaid Other | Admitting: Occupational Therapy

## 2020-01-07 ENCOUNTER — Other Ambulatory Visit: Payer: Self-pay

## 2020-01-07 DIAGNOSIS — M6281 Muscle weakness (generalized): Secondary | ICD-10-CM

## 2020-01-07 DIAGNOSIS — M25642 Stiffness of left hand, not elsewhere classified: Secondary | ICD-10-CM

## 2020-01-07 DIAGNOSIS — M79642 Pain in left hand: Secondary | ICD-10-CM

## 2020-01-07 NOTE — Therapy (Signed)
[facility] [address] [place], [place], [address] Phone: [phone]   Fax:  [phone]  Occupational Therapy Treatment  Patient Details  Name: Bridget Huffman MRN: [id number] Date of Birth: Oct 31, 1985 Referring Provider (OT): Dr. Melvyn Novas   Encounter Date: 01/07/2020   OT End of Session - 01/07/20 1241    Visit Number 2    Number of Visits 16    Date for OT Re-Evaluation 03/29/20    Authorization Type Medicaid pending    Authorization Time Period 1x week x 3 weeks followed by 2x week x 6 weeks    OT Start Time 1150    OT Stop Time 1210    OT Time Calculation (min) 20 min    Activity Tolerance Patient tolerated treatment well    Behavior During Therapy [facility] for tasks assessed/performed           Past Medical History:  Diagnosis Date  . Hx of preeclampsia, prior pregnancy, currently pregnant   . No pertinent past medical history   . Pregnancy induced hypertension    previous pregnancy    Past Surgical History:  Procedure Laterality Date  . APPENDECTOMY    . CESAREAN SECTION N/A 01/21/2017   Procedure: CESAREAN SECTION;  Surgeon: Marlow Baars, MD;  Location: [facility] BIRTHING SUITES;  Service: Obstetrics;  Laterality: N/A;  . LAPAROSCOPIC APPENDECTOMY N/A 05/31/2014   Procedure: APPENDECTOMY LAPAROSCOPIC;  Surgeon: Axel Filler, MD;  Location: [facility] OR;  Service: General;  Laterality: N/A;    There were no vitals filed for this visit.   Subjective Assessment - 01/07/20 1209    Subjective  Pt cut her hand    Pertinent History Zone 2 flexor tendon repair, digital n. repair LUE DOS 12/21/19    Limitations follow Zone 2 flexor tendon protocol    Patient Stated Goals regain  use of hand    Currently in Pain? Yes    Pain Score 2     Pain Location Hand    Pain Orientation Left    Pain Descriptors / Indicators Aching    Pain Type Acute pain;Surgical pain    Pain Onset 1 to 4 weeks ago    Pain Frequency  Intermittent    Aggravating Factors  exercise    Pain Relieving Factors rest                                OT Education - 01/07/20 1243    Education Details splint wear, care and precautions, hand hygeine, P/ROM within dorsal blocking splint reveiwed 5-10 reps each, hand hygine performed and fingers and wrist were dress with stockinette. no s/s of infection, steristrips in place over incision, stiches removed at MD office.    Person(s) Educated Patient    Methods Explanation;Demonstration;Verbal cues    Comprehension Verbalized understanding;Returned demonstration            OT Short Term Goals - 12/30/19 1223      OT SHORT TERM GOAL #1   Title I with splint wear care and precautions following 1-2 weeks of wear to ensure proper fit.    Baseline dependent splint issued on inital visit    Time 4    Period Weeks    Status New      OT SHORT TERM GOAL #2   Title I with inital HEP    Baseline dependent    Time 4    Period Weeks  Status New      OT SHORT TERM GOAL #3   Title I with scar massage and edema control prn.    Baseline dependent    Time 4    Period Weeks    Status New             OT Long Term Goals - 12/30/19 1223      OT LONG TERM GOAL #1   Title I with updated HEP.    Baseline dependent    Time 10    Period Weeks    Status New      OT LONG TERM GOAL #2   Title Pt will resume use of LUE as a non dominant assist at least 90% of the time for ADLs/ IADLs  with pain less than or equal to 2/10.    Baseline unable to use due to precautions, pain 3/10    Time 10    Period Weeks    Status New      OT LONG TERM GOAL #3   Title Pt will demonstrate grip strength of at least 25 lbs for increased functional use during ADLs    Baseline not tested due to precautions    Time 10    Period Weeks    Status New      OT LONG TERM GOAL #4   Title Pt will demonstrate at least 95% composite flexion and extension for increased functional use.      Baseline unable to perform due to precautions    Time 10    Period Weeks    Status New                 Plan - 01/07/20 1210    Clinical Impression Statement Pt is progressing towards goals. She demonstrates good understanding of inital exercises and splint wear, and care.    OT Occupational Profile and History Problem Focused Assessment - Including review of records relating to presenting problem    Occupational performance deficits (Please refer to evaluation for details): ADL's;IADL's;Work;Leisure;Social Participation    Body Structure / Function / Physical Skills ADL;Edema;Skin integrity;Strength;Endurance;UE functional use;Flexibility;FMC;Pain;Coordination;Decreased knowledge of precautions;GMC;ROM;Wound;Dexterity;IADL;Sensation    Rehab Potential Good    Clinical Decision Making Limited treatment options, no task modification necessary    Comorbidities Affecting Occupational Performance: None    Modification or Assistance to Complete Evaluation  No modification of tasks or assist necessary to complete eval    OT Frequency --   1x week x 3 weeks followed by 2x week x 6 weeks or 15 visits total plus eval   OT Treatment/Interventions Self-care/ADL training;Therapeutic exercise;Functional Mobility Training;Ultrasound;Neuromuscular education;Manual Therapy;Splinting;Therapeutic activities;Iontophoresis;Energy conservation;Cryotherapy;Paraffin;DME and/or AE instruction;Fluidtherapy;Patient/family education;Passive range of motion;Contrast Bath;Moist Heat;Electrical Stimulation    Plan progress exercises per Zone II flexor tendon  protocol, educate in 3 1/2 weeks post op exercises , when appropriate   Consulted and Agree with Plan of Care Patient           Patient will benefit from skilled therapeutic intervention in order to improve the following deficits and impairments:   Body Structure / Function / Physical Skills: ADL, Edema, Skin integrity, Strength, Endurance, UE functional  use, Flexibility, FMC, Pain, Coordination, Decreased knowledge of precautions, GMC, ROM, Wound, Dexterity, IADL, Sensation       Visit Diagnosis: Muscle weakness (generalized)  Stiffness of left hand, not elsewhere classified  Pain in left hand    Problem List Patient Active Problem List   Diagnosis Date Noted  . Decreased fetal  movement 01/20/2017  . Perforated appendicitis 06/01/2014  . Acute appendicitis 05/31/2014  . Annual physical exam 02/09/2014  . Pap smear for cervical cancer screening 02/09/2014  . NSVD (normal spontaneous vaginal delivery) 03/29/2012  . Mild preeclampsia delivered 03/29/2012  . GBS (group B streptococcus) UTI complicating pregnancy 08/21/2011    Kemonie Cutillo 01/07/2020, 12:44 PM  [facility] [address] [place], [place], [address] Phone: [phone]   Fax:  [phone]  Name: Bridget Huffman MRN: [id number] Date of Birth: 1985-08-16

## 2020-01-12 ENCOUNTER — Ambulatory Visit: Payer: Self-pay | Attending: Orthopedic Surgery | Admitting: Occupational Therapy

## 2020-01-12 ENCOUNTER — Encounter: Payer: Self-pay | Admitting: Occupational Therapy

## 2020-01-12 ENCOUNTER — Other Ambulatory Visit: Payer: Self-pay

## 2020-01-12 DIAGNOSIS — M25642 Stiffness of left hand, not elsewhere classified: Secondary | ICD-10-CM | POA: Insufficient documentation

## 2020-01-12 DIAGNOSIS — M6281 Muscle weakness (generalized): Secondary | ICD-10-CM | POA: Insufficient documentation

## 2020-01-12 DIAGNOSIS — M79642 Pain in left hand: Secondary | ICD-10-CM | POA: Insufficient documentation

## 2020-01-12 NOTE — Therapy (Signed)
[facility] [address] [place], [place], [address] Phone: [phone]   Fax:  [phone]  Occupational Therapy Treatment  Patient Details  Name: Bridget Huffman MRN: [id number] Date of Birth: 21-Mar-1986 Referring Provider (OT): Dr. Melvyn Novas   Encounter Date: 01/12/2020   OT End of Session - 01/12/20 0937    Visit Number 3    Number of Visits 16    Date for OT Re-Evaluation 03/29/20    Authorization Type Medicaid pending    Authorization Time Period 1x week x 3 weeks followed by 2x week x 6 weeks    OT Start Time 0850    OT Stop Time 0915    OT Time Calculation (min) 25 min    Activity Tolerance Patient tolerated treatment well    Behavior During Therapy [facility] for tasks assessed/performed           Past Medical History:  Diagnosis Date   Hx of preeclampsia, prior pregnancy, currently pregnant    No pertinent past medical history    Pregnancy induced hypertension    previous pregnancy    Past Surgical History:  Procedure Laterality Date   APPENDECTOMY     CESAREAN SECTION N/A 01/21/2017   Procedure: CESAREAN SECTION;  Surgeon: Marlow Baars, MD;  Location: [facility] BIRTHING SUITES;  Service: Obstetrics;  Laterality: N/A;   LAPAROSCOPIC APPENDECTOMY N/A 05/31/2014   Procedure: APPENDECTOMY LAPAROSCOPIC;  Surgeon: Axel Filler, MD;  Location: [facility] OR;  Service: General;  Laterality: N/A;    There were no vitals filed for this visit.   Subjective Assessment - 01/12/20 0854    Subjective  will I wear the splint the whole therapy?    Pertinent History Zone 2 flexor tendon repair, digital n. repair LUE DOS 12/21/19    Limitations follow Zone 2 flexor tendon protocol    Patient Stated Goals regain  use of hand    Currently in Pain? Yes    Pain Score 2     Pain Location Hand    Pain Orientation Left    Pain Descriptors / Indicators Aching    Pain Type Acute pain;Surgical pain    Pain Onset 1 to 4 weeks  ago    Pain Frequency Intermittent    Aggravating Factors  exercise    Pain Relieving Factors rest                   Treatment:Pt arrived wearing splint. Pt's dressing was removed, steri-strips are in place over incisions, no s/s of infection. New finger and forearm stockinette were applied..Reveiwed previous exercises and added 3 weeks post op exercises, pt verbalized and returned demonstration.             OT Education - 01/12/20 0941    Education Details reveiwed P/ROM in dorsal blocking splint, issued 3 weeks post -op tendon repair exercises from [place] modified duran protocol    Person(s) Educated Patient    Methods Explanation;Demonstration;Verbal cues;Handout    Comprehension Verbalized understanding;Returned demonstration;Verbal cues required            OT Short Term Goals - 12/30/19 1223      OT SHORT TERM GOAL #1   Title I with splint wear care and precautions following 1-2 weeks of wear to ensure proper fit.    Baseline dependent splint issued on inital visit    Time 4    Period Weeks    Status New      OT SHORT TERM GOAL #2  Title I with inital HEP    Baseline dependent    Time 4    Period Weeks    Status New      OT SHORT TERM GOAL #3   Title I with scar massage and edema control prn.    Baseline dependent    Time 4    Period Weeks    Status New             OT Long Term Goals - 12/30/19 1223      OT LONG TERM GOAL #1   Title I with updated HEP.    Baseline dependent    Time 10    Period Weeks    Status New      OT LONG TERM GOAL #2   Title Pt will resume use of LUE as a non dominant assist at least 90% of the time for ADLs/ IADLs  with pain less than or equal to 2/10.    Baseline unable to use due to precautions, pain 3/10    Time 10    Period Weeks    Status New      OT LONG TERM GOAL #3   Title Pt will demonstrate grip strength of at least 25 lbs for increased functional use during ADLs    Baseline not tested due  to precautions    Time 10    Period Weeks    Status New      OT LONG TERM GOAL #4   Title Pt will demonstrate at least 95% composite flexion and extension for increased functional use.    Baseline unable to perform due to precautions    Time 10    Period Weeks    Status New                 Plan - 01/12/20 7939    Clinical Impression Statement Pt is progressing towards goals. She demonstrates understanding of intial and 3 week exercises issued today.    OT Occupational Profile and History Problem Focused Assessment - Including review of records relating to presenting problem    Occupational performance deficits (Please refer to evaluation for details): ADL's;IADL's;Work;Leisure;Social Participation    Body Structure / Function / Physical Skills ADL;Edema;Skin integrity;Strength;Endurance;UE functional use;Flexibility;FMC;Pain;Coordination;Decreased knowledge of precautions;GMC;ROM;Wound;Dexterity;IADL;Sensation    Rehab Potential Good    Clinical Decision Making Limited treatment options, no task modification necessary    Comorbidities Affecting Occupational Performance: None    Modification or Assistance to Complete Evaluation  No modification of tasks or assist necessary to complete eval    OT Frequency --   1x week x 3 weeks followed by 2x week x 6 weeks or 15 visits total plus eval   OT Treatment/Interventions Self-care/ADL training;Therapeutic exercise;Functional Mobility Training;Ultrasound;Neuromuscular education;Manual Therapy;Splinting;Therapeutic activities;Iontophoresis;Energy conservation;Cryotherapy;Paraffin;DME and/or AE instruction;Fluidtherapy;Patient/family education;Passive range of motion;Contrast Bath;Moist Heat;Electrical Stimulation    Plan continue to progress per flexor tendon modified duran protocol    Consulted and Agree with Plan of Care Patient           Patient will benefit from skilled therapeutic intervention in order to improve the following  deficits and impairments:   Body Structure / Function / Physical Skills: ADL, Edema, Skin integrity, Strength, Endurance, UE functional use, Flexibility, FMC, Pain, Coordination, Decreased knowledge of precautions, GMC, ROM, Wound, Dexterity, IADL, Sensation       Visit Diagnosis: Muscle weakness (generalized)  Stiffness of left hand, not elsewhere classified  Pain in left hand    Problem List Patient Active  Problem List   Diagnosis Date Noted   Decreased fetal movement 01/20/2017   Perforated appendicitis 06/01/2014   Acute appendicitis 05/31/2014   Annual physical exam 02/09/2014   Pap smear for cervical cancer screening 02/09/2014   NSVD (normal spontaneous vaginal delivery) 03/29/2012   Mild preeclampsia delivered 03/29/2012   GBS (group B streptococcus) UTI complicating pregnancy 08/21/2011    Aqua Denslow 01/12/2020, 9:43 AM  [facility] [address] [place], [place], [address] Phone: [phone]   Fax:  [phone]  Name: Bridget Huffman MRN: [id number] Date of Birth: [birth year]

## 2020-01-22 ENCOUNTER — Other Ambulatory Visit: Payer: Self-pay

## 2020-01-22 ENCOUNTER — Ambulatory Visit: Payer: Self-pay | Admitting: Occupational Therapy

## 2020-01-22 DIAGNOSIS — M25642 Stiffness of left hand, not elsewhere classified: Secondary | ICD-10-CM

## 2020-01-22 DIAGNOSIS — M6281 Muscle weakness (generalized): Secondary | ICD-10-CM

## 2020-01-22 DIAGNOSIS — M79642 Pain in left hand: Secondary | ICD-10-CM

## 2020-01-22 NOTE — Therapy (Signed)
[facility] Rehabilitation [facility] [address] [place], [place], [address] Phone: [phone]   Fax:  [phone]  Occupational Therapy Treatment  Patient Details  Name: Bridget Huffman MRN: [id number] Date of Birth: September 09, 1985 Referring Provider (OT): Dr. Melvyn Novas   Encounter Date: 01/22/2020   OT End of Session - 01/22/20 0824    Visit Number 4    Number of Visits 16    Date for OT Re-Evaluation 03/29/20    Authorization Type Medicaid pending    Authorization Time Period 1x week x 3 weeks followed by 2x week x 6 weeks    OT Start Time 0804    OT Stop Time 0845    OT Time Calculation (min) 41 min    Equipment Utilized During Treatment 2 units hotpack    Activity Tolerance Patient tolerated treatment well    Behavior During Therapy [facility] for tasks assessed/performed           Past Medical History:  Diagnosis Date  . Hx of preeclampsia, prior pregnancy, currently pregnant   . No pertinent past medical history   . Pregnancy induced hypertension    previous pregnancy    Past Surgical History:  Procedure Laterality Date  . APPENDECTOMY    . CESAREAN SECTION N/A 01/21/2017   Procedure: CESAREAN SECTION;  Surgeon: Marlow Baars, MD;  Location: [facility] BIRTHING SUITES;  Service: Obstetrics;  Laterality: N/A;  . LAPAROSCOPIC APPENDECTOMY N/A 05/31/2014   Procedure: APPENDECTOMY LAPAROSCOPIC;  Surgeon: Axel Filler, MD;  Location: [facility] OR;  Service: General;  Laterality: N/A;    There were no vitals filed for this visit.   Subjective Assessment - 01/22/20 0823    Subjective  Pain is way better    Pertinent History Zone 2 flexor tendon repair, digital n. repair LUE DOS 12/21/19    Limitations follow Zone 2 flexor tendon protocol    Patient Stated Goals regain  use of hand    Currently in Pain? Yes    Pain Score 2     Pain Location Hand    Pain Orientation Left    Pain Descriptors / Indicators Aching    Pain Type Surgical pain     Pain Onset 1 to 4 weeks ago    Pain Frequency Intermittent    Aggravating Factors  exercise    Pain Relieving Factors rest    Multiple Pain Sites No                 Splint was removed and hand was cleaned with soap and water and dried thoroughly Steristrips are still in place, no s/s of infection Hotpack applied x 8 mins for stiffness and pain, no adverse reactions.               OT Education - 01/22/20 0827    Education Details reviewed P/ROM, place and hold and A/ROM in splint, added A/ROM exercises outside of splint for 4 weeks post op per protocol. Instructed pt in scar massage to palm lateral to incision . Pt was instructed not to get lotion in incision   Person(s) Educated Patient    Methods Explanation;Demonstration;Verbal cues;Handout    Comprehension Verbalized understanding;Returned demonstration;Verbal cues required            OT Short Term Goals - 12/30/19 1223      OT SHORT TERM GOAL #1   Title I with splint wear care and precautions following 1-2 weeks of wear to ensure proper fit.    Baseline dependent splint  issued on inital visit    Time 4    Period Weeks    Status New      OT SHORT TERM GOAL #2   Title I with inital HEP    Baseline dependent    Time 4    Period Weeks    Status New      OT SHORT TERM GOAL #3   Title I with scar massage and edema control prn.    Baseline dependent    Time 4    Period Weeks    Status New             OT Long Term Goals - 12/30/19 1223      OT LONG TERM GOAL #1   Title I with updated HEP.    Baseline dependent    Time 10    Period Weeks    Status New      OT LONG TERM GOAL #2   Title Pt will resume use of LUE as a non dominant assist at least 90% of the time for ADLs/ IADLs  with pain less than or equal to 2/10.    Baseline unable to use due to precautions, pain 3/10    Time 10    Period Weeks    Status New      OT LONG TERM GOAL #3   Title Pt will demonstrate grip strength of at  least 25 lbs for increased functional use during ADLs    Baseline not tested due to precautions    Time 10    Period Weeks    Status New      OT LONG TERM GOAL #4   Title Pt will demonstrate at least 95% composite flexion and extension for increased functional use.    Baseline unable to perform due to precautions    Time 10    Period Weeks    Status New                 Plan - 01/22/20 0825    Clinical Impression Statement Pt is progressing towards goals. she demonstrates understanding of her updated HEP.    OT Occupational Profile and History Problem Focused Assessment - Including review of records relating to presenting problem    Occupational performance deficits (Please refer to evaluation for details): ADL's;IADL's;Work;Leisure;Social Participation    Body Structure / Function / Physical Skills ADL;Edema;Skin integrity;Strength;Endurance;UE functional use;Flexibility;FMC;Pain;Coordination;Decreased knowledge of precautions;GMC;ROM;Wound;Dexterity;IADL;Sensation    Rehab Potential Good    Clinical Decision Making Limited treatment options, no task modification necessary    Comorbidities Affecting Occupational Performance: None    Modification or Assistance to Complete Evaluation  No modification of tasks or assist necessary to complete eval    OT Frequency --   1x week x 3 weeks followed by 2x week x 6 weeks or 15 visits total plus eval   OT Treatment/Interventions Self-care/ADL training;Therapeutic exercise;Functional Mobility Training;Ultrasound;Neuromuscular education;Manual Therapy;Splinting;Therapeutic activities;Iontophoresis;Energy conservation;Cryotherapy;Paraffin;DME and/or AE instruction;Fluidtherapy;Patient/family education;Passive range of motion;Contrast Bath;Moist Heat;Electrical Stimulation    Plan continue to progress per flexor tendon modified duran protocol (DOS 12/21/19)    Consulted and Agree with Plan of Care Patient           Patient will benefit from  skilled therapeutic intervention in order to improve the following deficits and impairments:   Body Structure / Function / Physical Skills: ADL, Edema, Skin integrity, Strength, Endurance, UE functional use, Flexibility, FMC, Pain, Coordination, Decreased knowledge of precautions, GMC, ROM, Wound, Dexterity, IADL, Sensation  Visit Diagnosis: Muscle weakness (generalized)  Stiffness of left hand, not elsewhere classified  Pain in left hand    Problem List Patient Active Problem List   Diagnosis Date Noted  . Decreased fetal movement 01/20/2017  . Perforated appendicitis 06/01/2014  . Acute appendicitis 05/31/2014  . Annual physical exam 02/09/2014  . Pap smear for cervical cancer screening 02/09/2014  . NSVD (normal spontaneous vaginal delivery) 03/29/2012  . Mild preeclampsia delivered 03/29/2012  . GBS (group B streptococcus) UTI complicating pregnancy 08/21/2011    Bridget Huffman 01/22/2020, 8:28 AM  [facility] [address] [place], [place], [address] Phone: [phone]   Fax:  [phone]  Name: Bridget Huffman MRN: [id number] Date of Birth: 25-Feb-1986

## 2020-01-25 ENCOUNTER — Ambulatory Visit: Payer: Self-pay | Admitting: Occupational Therapy

## 2020-01-27 ENCOUNTER — Ambulatory Visit: Payer: Self-pay | Admitting: Occupational Therapy

## 2020-01-27 ENCOUNTER — Other Ambulatory Visit: Payer: Self-pay

## 2020-01-27 DIAGNOSIS — M6281 Muscle weakness (generalized): Secondary | ICD-10-CM

## 2020-01-27 DIAGNOSIS — M79642 Pain in left hand: Secondary | ICD-10-CM

## 2020-01-27 DIAGNOSIS — M25642 Stiffness of left hand, not elsewhere classified: Secondary | ICD-10-CM

## 2020-01-27 NOTE — Therapy (Signed)
[facility] Health [facility] Rehabilitation [facility] [address] [place], [place], [address] Phone: [phone]   Fax:  [phone]  Occupational Therapy Treatment  Patient Details  Name: Bridget Huffman MRN: [id number] Date of Birth: 12/18/1985 Referring Provider (OT): Dr. Melvyn Novas   Encounter Date: 01/27/2020   OT End of Session - 01/27/20 0825    Visit Number 5    Number of Visits 16    Date for OT Re-Evaluation 03/29/20    Authorization Type Medicaid pending    Authorization Time Period 1x week x 3 weeks followed by 2x week x 6 weeks    OT Start Time 0805    OT Stop Time 0845    OT Time Calculation (min) 40 min    Equipment Utilized During Treatment 2 units hotpack    Activity Tolerance Patient tolerated treatment well    Behavior During Therapy [facility] for tasks assessed/performed           Past Medical History:  Diagnosis Date  . Hx of preeclampsia, prior pregnancy, currently pregnant   . No pertinent past medical history   . Pregnancy induced hypertension    previous pregnancy    Past Surgical History:  Procedure Laterality Date  . APPENDECTOMY    . CESAREAN SECTION N/A 01/21/2017   Procedure: CESAREAN SECTION;  Surgeon: Marlow Baars, MD;  Location: [facility] BIRTHING SUITES;  Service: Obstetrics;  Laterality: N/A;  . LAPAROSCOPIC APPENDECTOMY N/A 05/31/2014   Procedure: APPENDECTOMY LAPAROSCOPIC;  Surgeon: Axel Filler, MD;  Location: [facility] OR;  Service: General;  Laterality: N/A;    There were no vitals filed for this visit.   Subjective Assessment - 01/27/20 0923    Subjective  Denies pain    Pertinent History Zone 2 flexor tendon repair, digital n. repair LUE DOS 12/21/19    Limitations follow Zone 2 flexor tendon protocol    Patient Stated Goals regain  use of hand    Currently in Pain? No/denies    Pain Onset 1 to 4 weeks ago               Treatment: [place] , 0.8 w/cm 2, 20% x 8 mins to palmar incision avoiding 1 remaining  steri strip.No adverse reactions.  Padding added to splint for comfort due to reddened knuckles.  reviewed P/ROM finger flexion with active extension, place and hold and A/ROM in splint, reviewed A/ROM exercises outside of splint for 4 weeks post op per protocol.    NMES 50 pp, 250 pw, 10 secs cycle, x 10 mins to finger flexors intensity 17, no adverse reactions. Note sent with pt to take to MD.               OT Short Term Goals - 01/27/20 8299      OT SHORT TERM GOAL #1   Title I with splint wear care and precautions following 1-2 weeks of wear to ensure proper fit.    Baseline dependent splint issued on inital visit    Time 4    Period Weeks    Status Achieved      OT SHORT TERM GOAL #2   Title I with inital HEP    Baseline dependent    Time 4    Period Weeks    Status Achieved      OT SHORT TERM GOAL #3   Title I with scar massage and edema control prn.    Baseline dependent    Time 4    Period Weeks  Status On-going             OT Long Term Goals - 12/30/19 1223      OT LONG TERM GOAL #1   Title I with updated HEP.    Baseline dependent    Time 10    Period Weeks    Status New      OT LONG TERM GOAL #2   Title Pt will resume use of LUE as a non dominant assist at least 90% of the time for ADLs/ IADLs  with pain less than or equal to 2/10.    Baseline unable to use due to precautions, pain 3/10    Time 10    Period Weeks    Status New      OT LONG TERM GOAL #3   Title Pt will demonstrate grip strength of at least 25 lbs for increased functional use during ADLs    Baseline not tested due to precautions    Time 10    Period Weeks    Status New      OT LONG TERM GOAL #4   Title Pt will demonstrate at least 95% composite flexion and extension for increased functional use.    Baseline unable to perform due to precautions    Time 10    Period Weeks    Status New                 Plan - 01/27/20 8413    Clinical Impression  Statement Pt is progressing towards goals. Pt continues to demonstrate limited A/ROM finger flexion/ extension for digits 4, 5    OT Occupational Profile and History Problem Focused Assessment - Including review of records relating to presenting problem    Occupational performance deficits (Please refer to evaluation for details): ADL's;IADL's;Work;Leisure;Social Participation    Body Structure / Function / Physical Skills ADL;Edema;Skin integrity;Strength;Endurance;UE functional use;Flexibility;FMC;Pain;Coordination;Decreased knowledge of precautions;GMC;ROM;Wound;Dexterity;IADL;Sensation    Rehab Potential Good    Clinical Decision Making Limited treatment options, no task modification necessary    Comorbidities Affecting Occupational Performance: None    Modification or Assistance to Complete Evaluation  No modification of tasks or assist necessary to complete eval    OT Frequency --   1x week x 3 weeks followed by 2x week x 6 weeks or 15 visits total plus eval   OT Treatment/Interventions Self-care/ADL training;Therapeutic exercise;Functional Mobility Training;Ultrasound;Neuromuscular education;Manual Therapy;Splinting;Therapeutic activities;Iontophoresis;Energy conservation;Cryotherapy;Paraffin;DME and/or AE instruction;Fluidtherapy;Patient/family education;Passive range of motion;Contrast Bath;Moist Heat;Electrical Stimulation    Plan continue to progress per flexor tendon modified duran protocol (DOS 12/21/19), note sent with pt to take to MD    Consulted and Agree with Plan of Care Patient           Patient will benefit from skilled therapeutic intervention in order to improve the following deficits and impairments:   Body Structure / Function / Physical Skills: ADL, Edema, Skin integrity, Strength, Endurance, UE functional use, Flexibility, FMC, Pain, Coordination, Decreased knowledge of precautions, GMC, ROM, Wound, Dexterity, IADL, Sensation       Visit Diagnosis: Muscle weakness  (generalized)  Stiffness of left hand, not elsewhere classified  Pain in left hand    Problem List Patient Active Problem List   Diagnosis Date Noted  . Decreased fetal movement 01/20/2017  . Perforated appendicitis 06/01/2014  . Acute appendicitis 05/31/2014  . Annual physical exam 02/09/2014  . Pap smear for cervical cancer screening 02/09/2014  . NSVD (normal spontaneous vaginal delivery) 03/29/2012  . Mild preeclampsia delivered 03/29/2012  .  GBS (group B streptococcus) UTI complicating pregnancy 08/21/2011    Locke Barrell 01/27/2020, 9:28 AM  [facility] [address] [place], [place], [address] Phone: [phone]   Fax:  [phone]  Name: Bridget Huffman MRN: [id number] Date of Birth: [birth year]

## 2020-01-27 NOTE — Patient Instructions (Signed)
Dr Melvyn Novas,   I am seeing Ms. Bridget Huffman for occupational therapy.  She is almost 6 weeks post op. Can she begin weaning from splint at 6 weeks postop? She is progressing overall however continues to have limitations in A/ROM finger flexion/ extension at digits 4, 5. Please feel free to contact me with any questions.  Sincerely,   Samara Deist Khani Paino OTR/L

## 2020-02-02 ENCOUNTER — Ambulatory Visit: Payer: Self-pay | Admitting: Occupational Therapy

## 2020-02-02 ENCOUNTER — Other Ambulatory Visit: Payer: Self-pay

## 2020-02-02 DIAGNOSIS — M79642 Pain in left hand: Secondary | ICD-10-CM

## 2020-02-02 DIAGNOSIS — M25642 Stiffness of left hand, not elsewhere classified: Secondary | ICD-10-CM

## 2020-02-02 DIAGNOSIS — M6281 Muscle weakness (generalized): Secondary | ICD-10-CM

## 2020-02-02 NOTE — Therapy (Signed)
[facility] [address] [place], [place], [address] Phone: [phone]   Fax:  [phone]  Occupational Therapy Treatment  Patient Details  Name: Bridget Huffman MRN: [id number] Date of Birth: 06-17-1985 Referring Provider (OT): Dr. Melvyn Novas   Encounter Date: 02/02/2020   OT End of Session - 02/02/20 1220    Visit Number 6    Number of Visits 16    Date for OT Re-Evaluation 03/29/20    Authorization Type Medicaid pending    Authorization Time Period 1x week x 3 weeks followed by 2x week x 6 weeks    OT Start Time 0850    OT Stop Time 0930    OT Time Calculation (min) 40 min    Equipment Utilized During Treatment 2 units hotpack    Activity Tolerance Patient tolerated treatment well    Behavior During Therapy WFL for tasks assessed/performed           Past Medical History:  Diagnosis Date   Hx of preeclampsia, prior pregnancy, currently pregnant    No pertinent past medical history    Pregnancy induced hypertension    previous pregnancy    Past Surgical History:  Procedure Laterality Date   APPENDECTOMY     CESAREAN SECTION N/A 01/21/2017   Procedure: CESAREAN SECTION;  Surgeon: Marlow Baars, MD;  Location: [facility] BIRTHING SUITES;  Service: Obstetrics;  Laterality: N/A;   LAPAROSCOPIC APPENDECTOMY N/A 05/31/2014   Procedure: APPENDECTOMY LAPAROSCOPIC;  Surgeon: Axel Filler, MD;  Location: [facility] OR;  Service: General;  Laterality: N/A;    There were no vitals filed for this visit.   Subjective Assessment - 02/02/20 1223    Subjective  Denies pain    Pertinent History Zone 2 flexor tendon repair, digital n. repair LUE DOS 12/21/19    Limitations follow Zone 2 flexor tendon protocol    Patient Stated Goals regain  use of hand    Currently in Pain? Yes    Pain Score 3     Pain Location Hand    Pain Orientation Left    Pain Descriptors / Indicators Aching    Pain Type Acute pain    Pain Onset  More than a month ago    Pain Frequency Intermittent    Aggravating Factors  exercise    Pain Relieving Factors rest              Treatment: [place] , 0.8 w/cm 2, 20% x 8 mins to palmar incision and digits.No adverse reactions. Scar massage to palm  Educated pt in updated HEP for 6 weeks postop Splint d/c per MD orders.                  OT Education - 02/02/20 1222    Education Details exercises per modified Duran protocol 6 weeks postop, handouts provided.    Person(s) Educated Patient    Methods Explanation;Demonstration;Verbal cues;Handout    Comprehension Verbalized understanding;Returned demonstration;Verbal cues required            OT Short Term Goals - 01/27/20 4580      OT SHORT TERM GOAL #1   Title I with splint wear care and precautions following 1-2 weeks of wear to ensure proper fit.    Baseline dependent splint issued on inital visit    Time 4    Period Weeks    Status Achieved      OT SHORT TERM GOAL #2   Title I with inital HEP  Baseline dependent    Time 4    Period Weeks    Status Achieved      OT SHORT TERM GOAL #3   Title I with scar massage and edema control prn.    Baseline dependent    Time 4    Period Weeks    Status On-going             OT Long Term Goals - 12/30/19 1223      OT LONG TERM GOAL #1   Title I with updated HEP.    Baseline dependent    Time 10    Period Weeks    Status New      OT LONG TERM GOAL #2   Title Pt will resume use of LUE as a non dominant assist at least 90% of the time for ADLs/ IADLs  with pain less than or equal to 2/10.    Baseline unable to use due to precautions, pain 3/10    Time 10    Period Weeks    Status New      OT LONG TERM GOAL #3   Title Pt will demonstrate grip strength of at least 25 lbs for increased functional use during ADLs    Baseline not tested due to precautions    Time 10    Period Weeks    Status New      OT LONG TERM GOAL #4   Title Pt will  demonstrate at least 95% composite flexion and extension for increased functional use.    Baseline unable to perform due to precautions    Time 10    Period Weeks    Status New                 Plan - 02/02/20 1221    Clinical Impression Statement Pt is progressing towards goals slowly. she remains limited by stiffness in digits 4, 5    OT Occupational Profile and History Problem Focused Assessment - Including review of records relating to presenting problem    Occupational performance deficits (Please refer to evaluation for details): ADL's;IADL's;Work;Leisure;Social Participation    Body Structure / Function / Physical Skills ADL;Edema;Skin integrity;Strength;Endurance;UE functional use;Flexibility;FMC;Pain;Coordination;Decreased knowledge of precautions;GMC;ROM;Wound;Dexterity;IADL;Sensation    Rehab Potential Good    Clinical Decision Making Limited treatment options, no task modification necessary    Comorbidities Affecting Occupational Performance: None    Modification or Assistance to Complete Evaluation  No modification of tasks or assist necessary to complete eval    OT Frequency --   1x week x 3 weeks followed by 2x week x 6 weeks or 15 visits total plus eval   OT Treatment/Interventions Self-care/ADL training;Therapeutic exercise;Functional Mobility Training;Ultrasound;Neuromuscular education;Manual Therapy;Splinting;Therapeutic activities;Iontophoresis;Energy conservation;Cryotherapy;Paraffin;DME and/or AE instruction;Fluidtherapy;Patient/family education;Passive range of motion;Contrast Bath;Moist Heat;Electrical Stimulation    Plan continue to progress per flexor tendon modified duran protocol (DOS 12/21/19), MD said pt can d/c splint    Consulted and Agree with Plan of Care Patient           Patient will benefit from skilled therapeutic intervention in order to improve the following deficits and impairments:   Body Structure / Function / Physical Skills: ADL, Edema,  Skin integrity, Strength, Endurance, UE functional use, Flexibility, FMC, Pain, Coordination, Decreased knowledge of precautions, GMC, ROM, Wound, Dexterity, IADL, Sensation       Visit Diagnosis: Muscle weakness (generalized)  Stiffness of left hand, not elsewhere classified  Pain in left hand    Problem List Patient Active Problem  List   Diagnosis Date Noted   Decreased fetal movement 01/20/2017   Perforated appendicitis 06/01/2014   Acute appendicitis 05/31/2014   Annual physical exam 02/09/2014   Pap smear for cervical cancer screening 02/09/2014   NSVD (normal spontaneous vaginal delivery) 03/29/2012   Mild preeclampsia delivered 03/29/2012   GBS (group B streptococcus) UTI complicating pregnancy 08/21/2011    Manuel Lawhead 02/02/2020, 12:24 PM  [facility] [address] [place], [place], [address] Phone: [phone]   Fax:  [phone]  Name: Niajah Sipos MRN: [id number] Date of Birth: July 02, 1985

## 2020-02-04 ENCOUNTER — Ambulatory Visit: Payer: Self-pay | Admitting: Occupational Therapy

## 2020-02-04 ENCOUNTER — Other Ambulatory Visit: Payer: Self-pay

## 2020-02-04 ENCOUNTER — Encounter: Payer: Self-pay | Admitting: Occupational Therapy

## 2020-02-04 DIAGNOSIS — M6281 Muscle weakness (generalized): Secondary | ICD-10-CM

## 2020-02-04 DIAGNOSIS — M79642 Pain in left hand: Secondary | ICD-10-CM

## 2020-02-04 DIAGNOSIS — M25642 Stiffness of left hand, not elsewhere classified: Secondary | ICD-10-CM

## 2020-02-04 NOTE — Therapy (Signed)
[facility] [address] [place], [place], [address] Phone: [phone]   Fax:  [phone]  Occupational Therapy Treatment  Patient Details  Name: Bridget Huffman MRN: [id number] Date of Birth: Sep 16, 1985 Referring Provider (OT): Dr. Melvyn Novas   Encounter Date: 02/04/2020   OT End of Session - 02/04/20 0805    Visit Number 7    Number of Visits 16    Date for OT Re-Evaluation 03/29/20    Authorization Type Medicaid pending    Authorization Time Period 1x week x 3 weeks followed by 2x week x 6 weeks    OT Start Time 0803    OT Stop Time 0843    OT Time Calculation (min) 40 min    Equipment Utilized During Treatment 2 units hotpack    Activity Tolerance Patient tolerated treatment well    Behavior During Therapy [facility] for tasks assessed/performed           Past Medical History:  Diagnosis Date  . Hx of preeclampsia, prior pregnancy, currently pregnant   . No pertinent past medical history   . Pregnancy induced hypertension    previous pregnancy    Past Surgical History:  Procedure Laterality Date  . APPENDECTOMY    . CESAREAN SECTION N/A 01/21/2017   Procedure: CESAREAN SECTION;  Surgeon: Marlow Baars, MD;  Location: [facility] BIRTHING SUITES;  Service: Obstetrics;  Laterality: N/A;  . LAPAROSCOPIC APPENDECTOMY N/A 05/31/2014   Procedure: APPENDECTOMY LAPAROSCOPIC;  Surgeon: Axel Filler, MD;  Location: [facility] OR;  Service: General;  Laterality: N/A;    There were no vitals filed for this visit.   Subjective Assessment - 02/04/20 0805    Subjective  Denies pain    Pertinent History Zone 2 flexor tendon repair, digital n. repair LUE DOS 12/21/19    Limitations follow Zone 2 flexor tendon protocol    Patient Stated Goals regain  use of hand    Currently in Pain? No/denies    Pain Onset 1 to 4 weeks ago                    Treatment:US to left palm 3 mhz, 0.8 w/cm 2, 20% x for grossly 5 mins prior to  machine turning off(no charge due to equip failure), pt was unharmed. Place an hold exercises, then reviewed A/ROM exercises from 6 week protocol. Blocking to PIP and DIP joint for ring finger(not small) NMES 50 pps, 250 pw, 10 secs cycle  X 10 mins to finger flexors , pt assisting during on cycle, no adverse reactions.              OT Short Term Goals - 01/27/20 2706      OT SHORT TERM GOAL #1   Title I with splint wear care and precautions following 1-2 weeks of wear to ensure proper fit.    Baseline dependent splint issued on inital visit    Time 4    Period Weeks    Status Achieved      OT SHORT TERM GOAL #2   Title I with inital HEP    Baseline dependent    Time 4    Period Weeks    Status Achieved      OT SHORT TERM GOAL #3   Title I with scar massage and edema control prn.    Baseline dependent    Time 4    Period Weeks    Status On-going  OT Long Term Goals - 12/30/19 1223      OT LONG TERM GOAL #1   Title I with updated HEP.    Baseline dependent    Time 10    Period Weeks    Status New      OT LONG TERM GOAL #2   Title Pt will resume use of LUE as a non dominant assist at least 90% of the time for ADLs/ IADLs  with pain less than or equal to 2/10.    Baseline unable to use due to precautions, pain 3/10    Time 10    Period Weeks    Status New      OT LONG TERM GOAL #3   Title Pt will demonstrate grip strength of at least 25 lbs for increased functional use during ADLs    Baseline not tested due to precautions    Time 10    Period Weeks    Status New      OT LONG TERM GOAL #4   Title Pt will demonstrate at least 95% composite flexion and extension for increased functional use.    Baseline unable to perform due to precautions    Time 10    Period Weeks    Status New                 Plan - 02/04/20 1314    Clinical Impression Statement Pt is progressing slowly towards goals  wih significant stiffness in digits 4,5     OT Occupational Profile and History Problem Focused Assessment - Including review of records relating to presenting problem    Occupational performance deficits (Please refer to evaluation for details): ADL's;IADL's;Work;Leisure;Social Participation    Body Structure / Function / Physical Skills ADL;Edema;Skin integrity;Strength;Endurance;UE functional use;Flexibility;FMC;Pain;Coordination;Decreased knowledge of precautions;GMC;ROM;Wound;Dexterity;IADL;Sensation    Rehab Potential Good    Clinical Decision Making Limited treatment options, no task modification necessary    Comorbidities Affecting Occupational Performance: None    Modification or Assistance to Complete Evaluation  No modification of tasks or assist necessary to complete eval    OT Frequency 2x / week   1x week x 3 weeks followed by 2x week x 6 weeks or 15 visits total plus eval   OT Duration 6 weeks    OT Treatment/Interventions Self-care/ADL training;Therapeutic exercise;Functional Mobility Training;Ultrasound;Neuromuscular education;Manual Therapy;Splinting;Therapeutic activities;Iontophoresis;Energy conservation;Cryotherapy;Paraffin;DME and/or AE instruction;Fluidtherapy;Patient/family education;Passive range of motion;Contrast Bath;Moist Heat;Electrical Stimulation    Plan continue to progress per flexor tendon modified duran protocol (DOS 12/21/19), MD said pt can d/c splint    Consulted and Agree with Plan of Care Patient           Patient will benefit from skilled therapeutic intervention in order to improve the following deficits and impairments:   Body Structure / Function / Physical Skills: ADL, Edema, Skin integrity, Strength, Endurance, UE functional use, Flexibility, FMC, Pain, Coordination, Decreased knowledge of precautions, GMC, ROM, Wound, Dexterity, IADL, Sensation       Visit Diagnosis: Muscle weakness (generalized)  Stiffness of left hand, not elsewhere classified  Pain in left hand    Problem  List Patient Active Problem List   Diagnosis Date Noted  . Decreased fetal movement 01/20/2017  . Perforated appendicitis 06/01/2014  . Acute appendicitis 05/31/2014  . Annual physical exam 02/09/2014  . Pap smear for cervical cancer screening 02/09/2014  . NSVD (normal spontaneous vaginal delivery) 03/29/2012  . Mild preeclampsia delivered 03/29/2012  . GBS (group B streptococcus) UTI complicating pregnancy 08/21/2011  Bridget Huffman 02/04/2020, 1:15 PM  [facility] [address] [place], [place], [address] Phone: [phone]   Fax:  [phone]  Name: Bridget Huffman MRN: [id number] Date of Birth: March 04, 1986

## 2020-02-09 ENCOUNTER — Ambulatory Visit: Payer: Self-pay | Admitting: Occupational Therapy

## 2020-02-09 ENCOUNTER — Other Ambulatory Visit: Payer: Self-pay

## 2020-02-09 ENCOUNTER — Encounter: Payer: Self-pay | Admitting: Occupational Therapy

## 2020-02-09 DIAGNOSIS — M25642 Stiffness of left hand, not elsewhere classified: Secondary | ICD-10-CM

## 2020-02-09 DIAGNOSIS — M79642 Pain in left hand: Secondary | ICD-10-CM

## 2020-02-09 DIAGNOSIS — M6281 Muscle weakness (generalized): Secondary | ICD-10-CM

## 2020-02-09 NOTE — Therapy (Signed)
[facility] [address] [place], [place], [address] Phone: [phone]   Fax:  [phone]  Occupational Therapy Treatment  Patient Details  Name: Bridget Huffman MRN: [id number] Date of Birth: 1986/01/10 Referring Provider (OT): Dr. Melvyn Novas   Encounter Date: 02/09/2020   OT End of Session - 02/09/20 0908    Visit Number 8    Number of Visits 16    Date for OT Re-Evaluation 03/29/20    Authorization Type Medicaid pending    Authorization Time Period 1x week x 3 weeks followed by 2x week x 6 weeks    OT Start Time 0851    OT Stop Time 0933    OT Time Calculation (min) 42 min    Equipment Utilized During Treatment --    Activity Tolerance Patient tolerated treatment well    Behavior During Therapy [facility] for tasks assessed/performed           Past Medical History:  Diagnosis Date  . Hx of preeclampsia, prior pregnancy, currently pregnant   . No pertinent past medical history   . Pregnancy induced hypertension    previous pregnancy    Past Surgical History:  Procedure Laterality Date  . APPENDECTOMY    . CESAREAN SECTION N/A 01/21/2017   Procedure: CESAREAN SECTION;  Surgeon: Marlow Baars, MD;  Location: [facility] BIRTHING SUIT[facility];  Service: Obstetrics;  Laterality: N/A;  . LAPAROSCOPIC APPENDECTOMY N/A 05/31/2014   Procedure: APPENDECTOMY LAPAROSCOPIC;  Surgeon: Axel Filler, MD;  Location: [facility] OR;  Service: General;  Laterality: N/A;    There were no vitals filed for this visit.   Subjective Assessment - 02/09/20 0908    Subjective  "just stiff"    Pertinent History Zone 2 flexor tendon repair, digital n. repair LUE DOS 12/21/19    Limitations follow Zone 2 flexor tendon protocol    Patient Stated Goals regain  use of hand    Currently in Pain? No/denies    Pain Onset 1 to 4 weeks ago             Ultrasound to left palm 3 mhz, 0.8 w/cm 2, 20% x 8 min with no adverse reactions for scar tissue.     Reviewed scar massage, use of extractor for scar mobilization.  Isolated IP flex/ext AROM/AAROM, IP flex/ext with MP blocked (to ring finger only), composite flex/ext AROM and AAROM.  Gentle composite passive ext per protocol  NMES 50 pps, 250 pw, 10 secs cycle  X 10 mins to finger flexors , pt assisting during on cycle with flex and ext during off cycle, no adverse reactions.             OT Education - 02/09/20 1508    Education Details Gentle composite finger flex per protocol (s/p 7 weeks postop)    Person(s) Educated Patient    Methods Explanation;Demonstration;Verbal cues;Handout    Comprehension Verbalized understanding;Returned demonstration;Verbal cues required            OT Short Term Goals - 01/27/20 6546      OT SHORT TERM GOAL #1   Title I with splint wear care and precautions following 1-2 weeks of wear to ensure proper fit.    Baseline dependent splint issued on inital visit    Time 4    Period Weeks    Status Achieved      OT SHORT TERM GOAL #2   Title I with inital HEP    Baseline dependent    Time 4  Period Weeks    Status Achieved      OT SHORT TERM GOAL #3   Title I with scar massage and edema control prn.    Baseline dependent    Time 4    Period Weeks    Status On-going             OT Long Term Goals - 12/30/19 1223      OT LONG TERM GOAL #1   Title I with updated HEP.    Baseline dependent    Time 10    Period Weeks    Status New      OT LONG TERM GOAL #2   Title Pt will resume use of LUE as a non dominant assist at least 90% of the time for ADLs/ IADLs  with pain less than or equal to 2/10.    Baseline unable to use due to precautions, pain 3/10    Time 10    Period Weeks    Status New      OT LONG TERM GOAL #3   Title Pt will demonstrate grip strength of at least 25 lbs for increased functional use during ADLs    Baseline not tested due to precautions    Time 10    Period Weeks    Status New      OT LONG TERM  GOAL #4   Title Pt will demonstrate at least 95% composite flexion and extension for increased functional use.    Baseline unable to perform due to precautions    Time 10    Period Weeks    Status New                 Plan - 02/09/20 1510    Clinical Impression Statement Pt is progressing slowly towards goals  with continued significant stiffness in digits 4,5.    OT Occupational Profile and History Problem Focused Assessment - Including review of records relating to presenting problem    Occupational performance deficits (Please refer to evaluation for details): ADL's;IADL's;Work;Leisure;Social Participation    Body Structure / Function / Physical Skills ADL;Edema;Skin integrity;Strength;Endurance;UE functional use;Flexibility;FMC;Pain;Coordination;Decreased knowledge of precautions;GMC;ROM;Wound;Dexterity;IADL;Sensation    Rehab Potential Good    Clinical Decision Making Limited treatment options, no task modification necessary    Comorbidities Affecting Occupational Performance: None    Modification or Assistance to Complete Evaluation  No modification of tasks or assist necessary to complete eval    OT Frequency 2x / week   1x week x 3 weeks followed by 2x week x 6 weeks or 15 visits total plus eval   OT Duration 6 weeks    OT Treatment/Interventions Self-care/ADL training;Therapeutic exercise;Functional Mobility Training;Ultrasound;Neuromuscular education;Manual Therapy;Splinting;Therapeutic activities;Iontophoresis;Energy conservation;Cryotherapy;Paraffin;DME and/or AE instruction;Fluidtherapy;Patient/family education;Passive range of motion;Contrast Bath;Moist Heat;Electrical Stimulation    Plan continue to progress per flexor tendon modified duran protocol (DOS 12/21/19), consider splinting for PIP ext prn    Consulted and Agree with Plan of Care Patient           Patient will benefit from skilled therapeutic intervention in order to improve the following deficits and  impairments:   Body Structure / Function / Physical Skills: ADL, Edema, Skin integrity, Strength, Endurance, UE functional use, Flexibility, FMC, Pain, Coordination, Decreased knowledge of precautions, GMC, ROM, Wound, Dexterity, IADL, Sensation       Visit Diagnosis: Stiffness of left hand, not elsewhere classified  Muscle weakness (generalized)  Pain in left hand    Problem List Patient Active Problem List  Diagnosis Date Noted  . Decreased fetal movement 01/20/2017  . Perforated appendicitis 06/01/2014  . Acute appendicitis 05/31/2014  . Annual physical exam 02/09/2014  . Pap smear for cervical cancer screening 02/09/2014  . NSVD (normal spontaneous vaginal delivery) 03/29/2012  . Mild preeclampsia delivered 03/29/2012  . GBS (group B streptococcus) UTI complicating pregnancy 08/21/2011    [facility] 02/09/2020, 3:17 PM  [facility] [address] [place], [place], [address] Phone: [phone]   Fax:  [phone]  Name: Bridget Huffman MRN: [id number] Date of Birth: March 21, 1986   Willa Frater, OTR/L [facility] [address] [place], [place]  [address] [phone] phone [phone] 02/09/20 3:17 PM

## 2020-02-09 NOTE — Patient Instructions (Signed)
    PIP Extension (Passive    Use thumb of other hand on top of joint and two fingers underneath on either side to straighten finger. Hold 10 seconds. Repeat 10 times. Do 6 sessions per day.

## 2020-02-11 ENCOUNTER — Encounter: Payer: Medicaid Other | Admitting: Occupational Therapy

## 2020-02-17 ENCOUNTER — Ambulatory Visit: Payer: Self-pay | Attending: Orthopedic Surgery | Admitting: Occupational Therapy

## 2020-02-17 ENCOUNTER — Other Ambulatory Visit: Payer: Self-pay

## 2020-02-17 DIAGNOSIS — M6281 Muscle weakness (generalized): Secondary | ICD-10-CM | POA: Insufficient documentation

## 2020-02-17 DIAGNOSIS — M25642 Stiffness of left hand, not elsewhere classified: Secondary | ICD-10-CM | POA: Insufficient documentation

## 2020-02-17 DIAGNOSIS — M79642 Pain in left hand: Secondary | ICD-10-CM | POA: Insufficient documentation

## 2020-02-17 NOTE — Therapy (Signed)
[facility] [address] [place], [place], [address] Phone: [phone]   Fax:  [phone]  Occupational Therapy Treatment  Patient Details  Name: Bridget Huffman MRN: [id number] Date of Birth: 02/02/1986 Referring Provider (OT): Dr. Caralyn Guile   Encounter Date: 02/17/2020   OT End of Session - 02/17/20 1218    Visit Number 9    Number of Visits 16    Date for OT Re-Evaluation 03/29/20    Authorization Type Medicaid pending    Authorization Time Period 1x week x 3 weeks followed by 2x week x 6 weeks    OT Start Time 0805    OT Stop Time 0845    OT Time Calculation (min) 40 min    Activity Tolerance Patient tolerated treatment well    Behavior During Therapy [facility] for tasks assessed/performed           Past Medical History:  Diagnosis Date  . Hx of preeclampsia, prior pregnancy, currently pregnant   . No pertinent past medical history   . Pregnancy induced hypertension    previous pregnancy    Past Surgical History:  Procedure Laterality Date  . APPENDECTOMY    . CESAREAN SECTION N/A 01/21/2017   Procedure: CESAREAN SECTION;  Surgeon: Jerelyn Charles, MD;  Location: [facility];  Service: Obstetrics;  Laterality: N/A;  . LAPAROSCOPIC APPENDECTOMY N/A 05/31/2014   Procedure: APPENDECTOMY LAPAROSCOPIC;  Surgeon: Ralene Ok, MD;  Location: [facility];  Service: General;  Laterality: N/A;    There were no vitals filed for this visit.   Subjective Assessment - 02/17/20 1215    Subjective  Denies pain    Pertinent History Zone 2 flexor tendon repair, digital n. repair LUE DOS 12/21/19    Patient Stated Goals regain  use of hand    Currently in Pain? No/denies                  Treatment: Ultrasound to left palm 3 mhz, 0.8 w/cm 2, 50% x 8 min with no adverse reactions for scar tissue.    Reviewed scar massage, use of extractor for scar mobilization.  Isolated IP flex/ext AROM/AAROM, IP  flex/ext with MP blocked (to ring finger only), composite flex/ext AROM and AAROM.  Place and hold composite finger flexion  Gentle composite passive ext per protocol  Reverse knuckle bender splint issued                  OT Education - 02/17/20 0844    Education Details reverse knuckle bender splint for ring finger-splint wear , care and precautions, see pt instructions. yellow putty exercises for composite finger flexion    Person(s) Educated Patient    Methods Explanation;Demonstration;Verbal cues;Handout    Comprehension Verbalized understanding;Returned demonstration;Verbal cues required            OT Short Term Goals - 02/17/20 1217      OT SHORT TERM GOAL #1   Title I with splint wear care and precautions following 1-2 weeks of wear to ensure proper fit.    Baseline dependent splint issued on inital visit    Time 4    Period Weeks    Status Achieved      OT SHORT TERM GOAL #2   Title I with inital HEP    Baseline dependent    Time 4    Period Weeks    Status Achieved      OT SHORT TERM GOAL #3   Title I with  scar massage and edema control prn.    Baseline dependent    Time 4    Period Weeks    Status On-going   met for scar massage            OT Long Term Goals - 02/17/20 1217      OT LONG TERM GOAL #1   Title I with updated HEP.    Baseline dependent    Time 10    Period Weeks    Status New      OT LONG TERM GOAL #2   Title Pt will resume use of LUE as a non dominant assist at least 90% of the time for ADLs/ IADLs  with pain less than or equal to 2/10.    Baseline unable to use due to precautions, pain 3/10    Time 10    Period Weeks    Status New      OT LONG TERM GOAL #3   Title Pt will demonstrate grip strength of at least 25 lbs for increased functional use during ADLs    Baseline not tested due to precautions    Time 10    Period Weeks    Status New      OT LONG TERM GOAL #4   Title Pt will demonstrate at least 95%  composite flexion and extension for increased functional use.    Baseline unable to perform due to precautions    Time 10    Period Weeks    Status New                 Plan - 02/17/20 1216    Clinical Impression Statement Pt is progressing slowly towards goals  with continued significant stiffness in digits 4,5.    Occupational performance deficits (Please refer to evaluation for details): ADL's;IADL's;Work;Leisure;Social Participation    Body Structure / Function / Physical Skills ADL;Edema;Skin integrity;Strength;Endurance;UE functional use;Flexibility;FMC;Pain;Coordination;Decreased knowledge of precautions;GMC;ROM;Wound;Dexterity;IADL;Sensation    Rehab Potential Good    Clinical Decision Making Limited treatment options, no task modification necessary    Modification or Assistance to Complete Evaluation  No modification of tasks or assist necessary to complete eval    OT Frequency 2x / week    OT Duration 6 weeks    OT Treatment/Interventions Self-care/ADL training;Therapeutic exercise;Functional Mobility Training;Ultrasound;Neuromuscular education;Manual Therapy;Splinting;Therapeutic activities;Iontophoresis;Energy conservation;Cryotherapy;Paraffin;DME and/or AE instruction;Fluidtherapy;Patient/family education;Passive range of motion;Contrast Bath;Moist Heat;Electrical Stimulation    Plan continue to progress per flexor tendon modified duran protocol (DOS 12/21/19), check on reverse knuckle bender splint for ring finger, [place]    Consulted and Agree with Plan of Care Patient           Patient will benefit from skilled therapeutic intervention in order to improve the following deficits and impairments:   Body Structure / Function / Physical Skills: ADL, Edema, Skin integrity, Strength, Endurance, UE functional use, Flexibility, FMC, Pain, Coordination, Decreased knowledge of precautions, GMC, ROM, Wound, Dexterity, IADL, Sensation       Visit Diagnosis: Stiffness of left  hand, not elsewhere classified  Muscle weakness (generalized)  Pain in left hand    Problem List Patient Active Problem List   Diagnosis Date Noted  . Decreased fetal movement 01/20/2017  . Perforated appendicitis 06/01/2014  . Acute appendicitis 05/31/2014  . Annual physical exam 02/09/2014  . Pap smear for cervical cancer screening 02/09/2014  . NSVD (normal spontaneous vaginal delivery) 03/29/2012  . Mild preeclampsia delivered 03/29/2012  . GBS (group B streptococcus) UTI complicating pregnancy 89/38/1017  Bridget Huffman 02/17/2020, 3:34 PM Theone Murdoch, OTR/L [phone] Phone: [phone] 3:35 PM 02/17/20 [facility] [address] [place], [place], [address] Phone: [phone]   Fax:  [phone]  Name: Bridget Huffman MRN: [id number] Date of Birth: 22-May-1986

## 2020-02-17 NOTE — Patient Instructions (Signed)
Wear the finger extension splint on your ring finger for 10-15 mins 2x day. Remove splint if you have significant increased pain or circulation issues. Bring splint with you to next visit.

## 2020-02-19 ENCOUNTER — Other Ambulatory Visit: Payer: Self-pay

## 2020-02-19 ENCOUNTER — Ambulatory Visit: Payer: Self-pay | Admitting: Occupational Therapy

## 2020-02-19 DIAGNOSIS — M25642 Stiffness of left hand, not elsewhere classified: Secondary | ICD-10-CM

## 2020-02-19 DIAGNOSIS — M6281 Muscle weakness (generalized): Secondary | ICD-10-CM

## 2020-02-19 DIAGNOSIS — M79642 Pain in left hand: Secondary | ICD-10-CM

## 2020-02-19 NOTE — Therapy (Signed)
[facility] [address] [place], [place], [address] Phone: [phone]   Fax:  [phone]  Occupational Therapy Treatment  Patient Details  Name: Bridget Huffman MRN: [id number] Date of Birth: Sep 17, 1985 Referring Provider (OT): Dr. Caralyn Guile   Encounter Date: 02/19/2020   OT End of Session - 02/19/20 0910    Visit Number 10    Number of Visits 16    Date for OT Re-Evaluation 03/29/20    Authorization Type Medicaid pending    Authorization Time Period 1x week x 3 weeks followed by 2x week x 6 weeks    OT Start Time 0805    OT Stop Time 8182    OT Time Calculation (min) 42 min    Activity Tolerance Patient tolerated treatment well    Behavior During Therapy [facility] for tasks assessed/performed           Past Medical History:  Diagnosis Date   Hx of preeclampsia, prior pregnancy, currently pregnant    No pertinent past medical history    Pregnancy induced hypertension    previous pregnancy    Past Surgical History:  Procedure Laterality Date   APPENDECTOMY     CESAREAN SECTION N/A 01/21/2017   Procedure: CESAREAN SECTION;  Surgeon: Jerelyn Charles, MD;  Location: [facility];  Service: Obstetrics;  Laterality: N/A;   LAPAROSCOPIC APPENDECTOMY N/A 05/31/2014   Procedure: APPENDECTOMY LAPAROSCOPIC;  Surgeon: Ralene Ok, MD;  Location: [facility];  Service: General;  Laterality: N/A;    There were no vitals filed for this visit.   Subjective Assessment - 02/19/20 0913    Subjective  Denies pain    Pertinent History Zone 2 flexor tendon repair, digital n. repair LUE DOS 12/21/19    Patient Stated Goals regain  use of hand    Currently in Pain? No/denies                     Treatment: Ultrasound to left palm 3 mhz, 0.8 w/cm 2, 50% x 8 min with no adverse reactions for scar tissue.    Reviewed scar massage,  for scar mobilization.  Isolated IP flex/ext AROM/AAROM, IP flex/ext with MP  blocked (to ring finger only), composite flex/ext AROM and AAROM.  Place and hold composite finger flexion, Gentle composite passive ext per protocol  Reverse knuckle bender splint medium issued for ring finger, small moved to 5th digit as it is too tight and pt stopped wearing. NMES 50 pps, 250 pw, 10 secs cycle intensity 12, x 10 mins no adverse reactions.                OT Education - 02/19/20 0911    Education Details reverse knuckle bender splint for ring finger-splint wear upgraded to medium, moved small reverse knuckle bender to 5th digit , reviewed wear care and precautions, see pt instructions. Gel pad for scar mobilization.    Person(s) Educated Patient    Methods Explanation;Demonstration;Verbal cues;Handout    Comprehension Verbalized understanding;Returned demonstration;Verbal cues required            OT Short Term Goals - 02/17/20 1217      OT SHORT TERM GOAL #1   Title I with splint wear care and precautions following 1-2 weeks of wear to ensure proper fit.    Baseline dependent splint issued on inital visit    Time 4    Period Weeks    Status Achieved      OT SHORT TERM GOAL #  2   Title I with inital HEP    Baseline dependent    Time 4    Period Weeks    Status Achieved      OT SHORT TERM GOAL #3   Title I with scar massage and edema control prn.    Baseline dependent    Time 4    Period Weeks    Status On-going   met for scar massage            OT Long Term Goals - 02/17/20 1217      OT LONG TERM GOAL #1   Title I with updated HEP.    Baseline dependent    Time 10    Period Weeks    Status New      OT LONG TERM GOAL #2   Title Pt will resume use of LUE as a non dominant assist at least 90% of the time for ADLs/ IADLs  with pain less than or equal to 2/10.    Baseline unable to use due to precautions, pain 3/10    Time 10    Period Weeks    Status New      OT LONG TERM GOAL #3   Title Pt will demonstrate grip strength of at  least 25 lbs for increased functional use during ADLs    Baseline not tested due to precautions    Time 10    Period Weeks    Status New      OT LONG TERM GOAL #4   Title Pt will demonstrate at least 95% composite flexion and extension for increased functional use.    Baseline unable to perform due to precautions    Time 10    Period Weeks    Status New                 Plan - 02/19/20 0906    Clinical Impression Statement Pt is progressing slowly towards goals.    Occupational performance deficits (Please refer to evaluation for details): ADL's;IADL's;Work;Leisure;Social Participation    Body Structure / Function / Physical Skills ADL;Edema;Skin integrity;Strength;Endurance;UE functional use;Flexibility;FMC;Pain;Coordination;Decreased knowledge of precautions;GMC;ROM;Wound;Dexterity;IADL;Sensation    Rehab Potential Good    Clinical Decision Making Limited treatment options, no task modification necessary    Modification or Assistance to Complete Evaluation  No modification of tasks or assist necessary to complete eval    OT Frequency 2x / week    OT Duration 6 weeks    OT Treatment/Interventions Self-care/ADL training;Therapeutic exercise;Functional Mobility Training;Ultrasound;Neuromuscular education;Manual Therapy;Splinting;Therapeutic activities;Iontophoresis;Energy conservation;Cryotherapy;Paraffin;DME and/or AE instruction;Fluidtherapy;Patient/family education;Passive range of motion;Contrast Bath;Moist Heat;Electrical Stimulation    Plan continue to progress per flexor tendon modified duran protocol (DOS 12/21/19), check on reverse knuckle bender splint for ring and small fingers, [place] pt has reduced frequency to 1x week as she has returned to work    [employer] and Agree with Plan of Care Patient           Patient will benefit from skilled therapeutic intervention in order to improve the following deficits and impairments:   Body Structure / Function / Physical Skills:  ADL, Edema, Skin integrity, Strength, Endurance, UE functional use, Flexibility, FMC, Pain, Coordination, Decreased knowledge of precautions, GMC, ROM, Wound, Dexterity, IADL, Sensation       Visit Diagnosis: Stiffness of left hand, not elsewhere classified  Muscle weakness (generalized)  Pain in left hand    Problem List Patient Active Problem List   Diagnosis Date Noted   Decreased fetal movement 01/20/2017  Perforated appendicitis 06/01/2014   Acute appendicitis 05/31/2014   Annual physical exam 02/09/2014   Pap smear for cervical cancer screening 02/09/2014   NSVD (normal spontaneous vaginal delivery) 03/29/2012   Mild preeclampsia delivered 03/29/2012   GBS (group B streptococcus) UTI complicating pregnancy 45/62/5638    Bridget Huffman 02/19/2020, 9:14 AM Theone Murdoch, OTR/L [phone] Phone: [phone] 9:16 AM 02/19/20 [facility] [address] [place], [place], [address] Phone: [phone]   Fax:  [phone]  Name: Bridget Huffman MRN: [id number] Date of Birth: 1985/11/13

## 2020-02-19 NOTE — Patient Instructions (Signed)
Wear small finger extension splint on pinky and medium extension splint on ring finger 15 mins 1-2 x day. Stop wearing if pain or circulation issues. Use stockinette with gel pad over incision at night

## 2020-02-23 ENCOUNTER — Encounter: Payer: Medicaid Other | Admitting: Occupational Therapy

## 2020-02-25 ENCOUNTER — Ambulatory Visit: Payer: Self-pay | Admitting: Occupational Therapy

## 2020-03-01 ENCOUNTER — Encounter: Payer: Medicaid Other | Admitting: Occupational Therapy

## 2020-03-03 ENCOUNTER — Ambulatory Visit: Payer: Self-pay | Admitting: Occupational Therapy

## 2020-03-08 ENCOUNTER — Encounter: Payer: Medicaid Other | Admitting: Occupational Therapy

## 2020-03-15 ENCOUNTER — Encounter: Payer: Self-pay | Admitting: Nurse Practitioner

## 2020-03-15 ENCOUNTER — Ambulatory Visit: Payer: Self-pay | Attending: Nurse Practitioner | Admitting: Nurse Practitioner

## 2020-03-15 ENCOUNTER — Encounter: Payer: Medicaid Other | Admitting: Occupational Therapy

## 2020-03-15 ENCOUNTER — Other Ambulatory Visit: Payer: Self-pay

## 2020-03-15 VITALS — Ht 65.0 in | Wt 175.0 lb

## 2020-03-15 DIAGNOSIS — Z7689 Persons encountering health services in other specified circumstances: Secondary | ICD-10-CM

## 2020-03-15 NOTE — Progress Notes (Signed)
Virtual Visit via Telephone Note Due to national recommendations of social distancing due to COVID 19, telehealth visit is felt to be most appropriate for this patient at this time.  I discussed the limitations, risks, security and privacy concerns of performing an evaluation and management service by telephone and the availability of in person appointments. I also discussed with the patient that there may be a patient responsible charge related to this service. The patient expressed understanding and agreed to proceed.    I connected with Bridget Huffman on 03/15/20  at   3:30 PM EDT  EDT by telephone and verified that I am speaking with the correct person using two identifiers.   Consent I discussed the limitations, risks, security and privacy concerns of performing an evaluation and management service by telephone and the availability of in person appointments. I also discussed with the patient that there may be a patient responsible charge related to this service. The patient expressed understanding and agreed to proceed.   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 These are reviewed and up-to-date. Referrals have been placed accordingly. Immunizations are up-to-date or declined.    PAP SMEAR: Scheduled  Denies chest pain, shortness of breath, palpitations, lightheadedness, dizziness, headaches or BLE edema.    Past Medical History:  Diagnosis Date   Hx of preeclampsia, prior pregnancy, currently pregnant    No pertinent past medical history    Pregnancy induced hypertension    previous pregnancy    Past Surgical History:  Procedure  Laterality Date   APPENDECTOMY     CESAREAN SECTION N/A 01/21/2017   Procedure: CESAREAN SECTION;  Surgeon: Marlow Baars, MD;  Location: [facility];  Service: Obstetrics;  Laterality: N/A;   LAPAROSCOPIC APPENDECTOMY N/A 05/31/2014   Procedure: APPENDECTOMY LAPAROSCOPIC;  Surgeon: Axel Filler, MD;  Location: [facility];  Service: General;  Laterality: N/A;    Family History  Problem Relation Age of Onset   Hypertension Mother    Alcohol abuse Neg Hx    Arthritis Neg Hx    Asthma Neg Hx    Birth defects Neg Hx    Cancer Neg Hx    COPD Neg Hx    Depression Neg Hx    Diabetes Neg Hx    Drug abuse Neg Hx    Early death Neg Hx    Hearing loss Neg Hx    Heart disease Neg Hx    Hyperlipidemia Neg Hx    Learning disabilities Neg Hx    Kidney disease Neg Hx    Mental illness Neg Hx    Mental retardation Neg Hx    Miscarriages / Stillbirths Neg Hx    Stroke Neg Hx    Vision loss Neg Hx     Social History   Socioeconomic History   Marital status: Married    Spouse name: Not on file   Number of children: Not on file   Years of education: Not on file   Highest education level: Not on file  Occupational History   Not on file  Tobacco Use   Smoking status: Never Smoker   Smokeless tobacco: Never Used  Substance and Sexual Activity   Alcohol use: No  Drug use: No   Sexual activity: Yes    Birth control/protection: Condom  Other Topics Concern   Not on file  Social History Narrative   Not on file   Social Determinants of Health   Financial Resource Strain:    Difficulty of Paying Living Expenses: Not on file  Food Insecurity:    Worried About Programme researcher, broadcasting/film/video in the Last Year: Not on file   [employer] of Food in the Last Year: Not on file  Transportation Needs:    Lack of Transportation (Medical): Not on file   Lack of Transportation (Non-Medical): Not on file  Physical Activity:    Days of Exercise per Week: Not on  file   Minutes of Exercise per Session: Not on file  Stress:    Feeling of Stress : Not on file  Social Connections:    Frequency of Communication with Friends and Family: Not on file   Frequency of Social Gatherings with Friends and Family: Not on file   Attends Religious Services: Not on file   Active Member of Clubs or Organizations: Not on file   Attends Banker Meetings: Not on file   Marital Status: Not on file     Observations/Objective: Awake, alert and oriented x 3   Review of Systems  Constitutional: Negative for fever, malaise/fatigue and weight loss.  HENT: Negative.  Negative for nosebleeds.   Eyes: Negative.  Negative for blurred vision, double vision and photophobia.  Respiratory: Negative.  Negative for cough and shortness of breath.   Cardiovascular: Negative.  Negative for chest pain, palpitations and leg swelling.  Gastrointestinal: Negative.  Negative for heartburn, nausea and vomiting.  Musculoskeletal: Negative.  Negative for myalgias.  Neurological: Negative.  Negative for dizziness, focal weakness, seizures and headaches.  Psychiatric/Behavioral: Negative.  Negative for suicidal ideas.    Assessment and Plan: Bridget Huffman was seen today for establish care.  Diagnoses and all orders for this visit:  Encounter to establish care Follow Up Instructions Return for PAP SMEAR.     I discussed the assessment and treatment plan with the patient. The patient was provided an opportunity to ask questions and all were answered. The patient agreed with the plan and demonstrated an understanding of the instructions.   The patient was advised to call back or seek an in-person evaluation if the symptoms worsen or if the condition fails to improve as anticipated.  I provided 13 minutes of non-face-to-face time during this encounter including median intraservice time, reviewing previous notes, labs, imaging, medications and explaining diagnosis and  management.  Claiborne Rigg, FNP-BC

## 2020-03-22 ENCOUNTER — Other Ambulatory Visit: Payer: Self-pay

## 2020-03-22 ENCOUNTER — Ambulatory Visit: Payer: Medicaid Other | Attending: Nurse Practitioner

## 2020-03-22 ENCOUNTER — Other Ambulatory Visit: Payer: Self-pay | Admitting: Nurse Practitioner

## 2020-03-22 DIAGNOSIS — Z1329 Encounter for screening for other suspected endocrine disorder: Secondary | ICD-10-CM

## 2020-03-22 DIAGNOSIS — Z13220 Encounter for screening for lipoid disorders: Secondary | ICD-10-CM

## 2020-03-22 DIAGNOSIS — Z13228 Encounter for screening for other metabolic disorders: Secondary | ICD-10-CM

## 2020-03-22 DIAGNOSIS — Z1159 Encounter for screening for other viral diseases: Secondary | ICD-10-CM

## 2020-03-22 DIAGNOSIS — Z131 Encounter for screening for diabetes mellitus: Secondary | ICD-10-CM

## 2020-03-22 DIAGNOSIS — Z130 Encounter for screening for diseases of the blood and blood-forming organs and certain disorders involving the immune mechanism: Secondary | ICD-10-CM

## 2020-03-23 LAB — CMP14+EGFR
ALT: 18 IU/L (ref 0–32)
AST: 17 IU/L (ref 0–40)
Albumin/Globulin Ratio: 2.3 — ABNORMAL HIGH (ref 1.2–2.2)
Albumin: 4.8 g/dL (ref 3.8–4.8)
Alkaline Phosphatase: 65 IU/L (ref 44–121)
BUN/Creatinine Ratio: 15 (ref 9–23)
BUN: 10 mg/dL (ref 6–20)
Bilirubin Total: 0.2 mg/dL (ref 0.0–1.2)
CO2: 24 mmol/L (ref 20–29)
Calcium: 9.3 mg/dL (ref 8.7–10.2)
Chloride: 99 mmol/L (ref 96–106)
Creatinine, Ser: 0.68 mg/dL (ref 0.57–1.00)
GFR calc Af Amer: 132 mL/min/{1.73_m2} (ref >59)
GFR calc non Af Amer: 114 mL/min/{1.73_m2} (ref >59)
Globulin, Total: 2.1 g/dL (ref 1.5–4.5)
Glucose: 86 mg/dL (ref 65–99)
Potassium: 4.4 mmol/L (ref 3.5–5.2)
Sodium: 137 mmol/L (ref 134–144)
Total Protein: 6.9 g/dL (ref 6.0–8.5)

## 2020-03-23 LAB — CBC
Hematocrit: 42 % (ref 34.0–46.6)
Hemoglobin: 14.1 g/dL (ref 11.1–15.9)
MCH: 30.2 pg (ref 26.6–33.0)
MCHC: 33.6 g/dL (ref 31.5–35.7)
MCV: 90 fL (ref 79–97)
Platelets: 230 10*3/uL (ref 150–450)
RBC: 4.67 x10E6/uL (ref 3.77–5.28)
RDW: 12.3 % (ref 11.7–15.4)
WBC: 9 10*3/uL (ref 3.4–10.8)

## 2020-03-23 LAB — TSH: TSH: 2.6 u[IU]/mL (ref 0.450–4.500)

## 2020-03-23 LAB — LIPID PANEL
Chol/HDL Ratio: 3 ratio (ref 0.0–4.4)
Cholesterol, Total: 151 mg/dL (ref 100–199)
HDL: 51 mg/dL (ref >39)
LDL Chol Calc (NIH): 80 mg/dL (ref 0–99)
Triglycerides: 113 mg/dL (ref 0–149)
VLDL Cholesterol Cal: 20 mg/dL (ref 5–40)

## 2020-03-23 LAB — HEMOGLOBIN A1C
Est. average glucose Bld gHb Est-mCnc: 108 mg/dL
Hgb A1c MFr Bld: 5.4 % (ref 4.8–5.6)

## 2020-03-23 LAB — HEPATITIS C ANTIBODY: Hep C Virus Ab: <0.1 s/co ratio (ref 0.0–0.9)

## 2020-04-07 ENCOUNTER — Ambulatory Visit: Payer: Self-pay | Attending: Nurse Practitioner

## 2020-04-07 ENCOUNTER — Other Ambulatory Visit: Payer: Self-pay

## 2020-04-18 ENCOUNTER — Telehealth: Payer: Self-pay | Admitting: Nurse Practitioner

## 2020-04-18 NOTE — Telephone Encounter (Signed)
Copied from CRM [id number]. Topic: General - Other >> Apr 18, 2020 10:32 AM Tamela Oddi wrote: Reason for CRM: Patient called to speak with Mikle Bosworth regarding an appt.  Please call patient at [phone].

## 2020-04-18 NOTE — Telephone Encounter (Signed)
I call back back the Pt, VM is full, to inform her that she has a financial appt on 04/21/20

## 2020-04-21 ENCOUNTER — Other Ambulatory Visit: Payer: Self-pay

## 2020-04-21 ENCOUNTER — Ambulatory Visit: Payer: Self-pay | Attending: Nurse Practitioner

## 2020-04-26 ENCOUNTER — Encounter: Payer: Self-pay | Admitting: Nurse Practitioner

## 2020-04-26 ENCOUNTER — Other Ambulatory Visit: Payer: Self-pay

## 2020-04-26 ENCOUNTER — Ambulatory Visit: Payer: Self-pay | Attending: Nurse Practitioner | Admitting: Nurse Practitioner

## 2020-04-26 VITALS — BP 112/76 | HR 89 | Temp 96.5°F | Ht 69.0 in | Wt 177.0 lb

## 2020-04-26 DIAGNOSIS — M25642 Stiffness of left hand, not elsewhere classified: Secondary | ICD-10-CM

## 2020-04-26 DIAGNOSIS — Z124 Encounter for screening for malignant neoplasm of cervix: Secondary | ICD-10-CM | POA: Insufficient documentation

## 2020-04-26 NOTE — Progress Notes (Addendum)
Assessment & Plan:  Bridget Huffman was seen today for gynecologic exam.  Diagnoses and all orders for this visit:  Encounter for Papanicolaou smear for cervical cancer screening -     Cytology - PAP -     Cervicovaginal ancillary only  Stiffness of left hand joint -     Ambulatory referral to Hand Surgery    Patient has been counseled on age-appropriate routine health concerns for screening and prevention. These are reviewed and up-to-date. Referrals have been placed accordingly. Immunizations are up-to-date or declined.    Subjective:   Chief Complaint  Patient presents with  . Gynecologic Exam   HPI Bridget Huffman 34 y.o. female presents to office today for pap smear.    Needs referral back to Dr. Melvyn Novas for left hand laceration with repair (Flexor tendon repair) She had therapy earlier this year with OT however she states at this time she is unable to fully extend the left pinky and ring finger and there is persistent left hand stiffness.   Review of Systems  Constitutional: Negative.  Negative for chills, fever, malaise/fatigue and weight loss.  Respiratory: Negative.  Negative for cough, shortness of breath and wheezing.   Cardiovascular: Negative.  Negative for chest pain, orthopnea and leg swelling.  Gastrointestinal: Negative for abdominal pain.  Genitourinary: Negative.  Negative for flank pain.  Musculoskeletal:       SEE HPI  Skin: Negative.  Negative for rash.  Psychiatric/Behavioral: Negative for suicidal ideas.    Past Medical History:  Diagnosis Date  . Hx of preeclampsia, prior pregnancy, currently pregnant   . No pertinent past medical history   . Pregnancy induced hypertension    previous pregnancy    Past Surgical History:  Procedure Laterality Date  . APPENDECTOMY    . CESAREAN SECTION N/A 01/21/2017   Procedure: CESAREAN SECTION;  Surgeon: Marlow Baars, MD;  Location: [facility] BIRTHING SUITES;  Service: Obstetrics;  Laterality: N/A;  .  LAPAROSCOPIC APPENDECTOMY N/A 05/31/2014   Procedure: APPENDECTOMY LAPAROSCOPIC;  Surgeon: Axel Filler, MD;  Location: [facility] OR;  Service: General;  Laterality: N/A;    Family History  Problem Relation Age of Onset  . Hypertension Mother   . Alcohol abuse Neg Hx   . Arthritis Neg Hx   . Asthma Neg Hx   . Birth defects Neg Hx   . Cancer Neg Hx   . COPD Neg Hx   . Depression Neg Hx   . Diabetes Neg Hx   . Drug abuse Neg Hx   . Early death Neg Hx   . Hearing loss Neg Hx   . Heart disease Neg Hx   . Hyperlipidemia Neg Hx   . Learning disabilities Neg Hx   . Kidney disease Neg Hx   . Mental illness Neg Hx   . Mental retardation Neg Hx   . Miscarriages / Stillbirths Neg Hx   . Stroke Neg Hx   . Vision loss Neg Hx     Social History Reviewed with no changes to be made today.   Outpatient Medications Prior to Visit  Medication Sig Dispense Refill  . Multiple Vitamin (MULTIVITAMIN WITH MINERALS) TABS tablet Take 1 tablet by mouth daily.    . cephALEXin (KEFLEX) 500 MG capsule Take 1 capsule (500 mg total) by mouth 4 (four) times daily. 20 capsule 0  . acetaminophen (TYLENOL) 325 MG tablet Take 650 mg by mouth every 6 (six) hours as needed for mild pain. (Patient not taking: Reported on 04/26/2020)    .  docusate sodium (COLACE) 100 MG capsule Take 1 capsule (100 mg total) by mouth 2 (two) times daily. (Patient not taking: Reported on 12/13/2019) 60 capsule 0  . ibuprofen (ADVIL,MOTRIN) 600 MG tablet Take 1 tablet (600 mg total) by mouth every 6 (six) hours as needed. (Patient not taking: Reported on 12/13/2019) 90 tablet 0  . ondansetron (ZOFRAN ODT) 4 MG disintegrating tablet Take 1 tablet (4 mg total) by mouth every 8 (eight) hours as needed for nausea or vomiting. (Patient not taking: Reported on 03/15/2020) 10 tablet 0  . oxyCODONE-acetaminophen (PERCOCET/ROXICET) 5-325 MG tablet Take 1 tablet by mouth every 4 (four) hours as needed for severe pain. (Patient not taking: Reported on  03/15/2020) 12 tablet 0  . Prenatal Vit-Fe Fumarate-FA (PRENATAL VITAMIN AND MINERAL) 28-0.8 MG TABS Take 1 tablet by mouth daily. (Patient not taking: Reported on 12/13/2019) 30 tablet 11   No facility-administered medications prior to visit.    No Known Allergies     Objective:    BP 112/76 (BP Location: Left Arm, Patient Position: Sitting, Cuff Size: Normal)   Pulse 89   Temp (!) 96.5 F (35.8 C) (Temporal)   Ht 5\' 9"  (1.753 m)   Wt 177 lb (80.3 kg)   LMP 03/28/2020   SpO2 100%   BMI 26.14 kg/m  Wt Readings from Last 3 Encounters:  04/26/20 177 lb (80.3 kg)  03/15/20 175 lb (79.4 kg)  01/20/17 211 lb (95.7 kg)    Physical Exam Exam conducted with a chaperone present.  Constitutional:      Appearance: She is well-developed.  HENT:     Head: Normocephalic.  Cardiovascular:     Rate and Rhythm: Normal rate and regular rhythm.     Heart sounds: Normal heart sounds.  Pulmonary:     Effort: Pulmonary effort is normal.     Breath sounds: Normal breath sounds.  Abdominal:     General: Bowel sounds are normal.     Palpations: Abdomen is soft.     Hernia: There is no hernia in the left inguinal area.  Genitourinary:    Exam position: Lithotomy position.     Labia:        Right: No rash, tenderness, lesion or injury.        Left: No rash, tenderness, lesion or injury.      Vagina: Normal. No signs of injury and foreign body. No vaginal discharge, erythema, tenderness or bleeding.     Cervix: No cervical motion tenderness or friability.     Uterus: Not deviated and not enlarged.      Adnexa:        Right: No mass, tenderness or fullness.         Left: No mass, tenderness or fullness.       Rectum: Normal. No external hemorrhoid.  Musculoskeletal:     Left hand: Deformity present. Decreased range of motion. Decreased strength.  Lymphadenopathy:     Lower Body: No right inguinal adenopathy. No left inguinal adenopathy.  Skin:    General: Skin is warm and dry.    Neurological:     Mental Status: She is alert and oriented to person, place, and time.  Psychiatric:        Behavior: Behavior normal.        Thought Content: Thought content normal.        Judgment: Judgment normal.          Patient has been counseled extensively about nutrition and exercise as  well as the importance of adherence with medications and regular follow-up. The patient was given clear instructions to go to ER or return to medical center if symptoms don't improve, worsen or new problems develop. The patient verbalized understanding.   Follow-up: Return if symptoms worsen or fail to improve.   Claiborne Rigg, FNP-BC [facility] [place], [place] [phone]   04/26/2020, 3:44 PM

## 2020-04-26 NOTE — Addendum Note (Signed)
Addended by: Bertram Denver on: 04/26/2020 03:43 PM   Modules accepted: Orders

## 2020-04-27 LAB — CERVICOVAGINAL ANCILLARY ONLY
Bacterial Vaginitis (gardnerella): NEGATIVE
Candida Glabrata: NEGATIVE
Candida Vaginitis: NEGATIVE
Chlamydia: NEGATIVE
Comment: NEGATIVE
Comment: NEGATIVE
Comment: NEGATIVE
Comment: NEGATIVE
Comment: NEGATIVE
Comment: NORMAL
Neisseria Gonorrhea: NEGATIVE
Trichomonas: NEGATIVE

## 2020-04-29 LAB — CYTOLOGY - PAP
Comment: NEGATIVE
Diagnosis: NEGATIVE
High risk HPV: NEGATIVE

## 2020-06-06 ENCOUNTER — Other Ambulatory Visit: Payer: Self-pay

## 2020-06-06 ENCOUNTER — Encounter (HOSPITAL_BASED_OUTPATIENT_CLINIC_OR_DEPARTMENT_OTHER): Payer: Self-pay | Admitting: Orthopedic Surgery

## 2020-06-13 ENCOUNTER — Other Ambulatory Visit (HOSPITAL_COMMUNITY)
Admission: RE | Admit: 2020-06-13 | Discharge: 2020-06-13 | Disposition: A | Discharge status: Home / Self Care | Payer: Medicaid Other | Source: Ambulatory Visit | Attending: Orthopedic Surgery | Admitting: Orthopedic Surgery

## 2020-06-13 DIAGNOSIS — Z01812 Encounter for preprocedural laboratory examination: Secondary | ICD-10-CM | POA: Insufficient documentation

## 2020-06-13 DIAGNOSIS — Z20822 Contact with and (suspected) exposure to covid-19: Secondary | ICD-10-CM | POA: Diagnosis not present

## 2020-06-13 LAB — SARS CORONAVIRUS 2 (TAT 6-24 HRS): SARS Coronavirus 2: NEGATIVE

## 2020-06-14 NOTE — H&P (Signed)
Bridget Huffman is an 35 y.o. female.   Chief Complaint: LEFT HAND PAIN  HPI: The patient is a 35 year old right-hand dominant female who sustained a laceration on the left hand on 12/13/19.  On 12/21/19 she underwent a repair of the FDS and FDP tendons in the digital nerve repair of the ring and small fingers.  She went through normal postoperative recovery including splinting and therapy.  She has continued to have limitations with range of motion that is affecting her normal day-to-day activities. Discussed surgical intervention. She is here today for surgery. She denies chest pain, shortness of breath, fever, chills, nausea, vomiting, diarrhea.   Past Medical History:  Diagnosis Date  . Hx of preeclampsia, prior pregnancy, currently pregnant   . No pertinent past medical history   . Pregnancy induced hypertension    previous pregnancy    Past Surgical History:  Procedure Laterality Date  . APPENDECTOMY    . CESAREAN SECTION N/A 01/21/2017   Procedure: CESAREAN SECTION;  Surgeon: Marlow Baars, MD;  Location: [facility] BIRTHING SUITES;  Service: Obstetrics;  Laterality: N/A;  . LAPAROSCOPIC APPENDECTOMY N/A 05/31/2014   Procedure: APPENDECTOMY LAPAROSCOPIC;  Surgeon: Axel Filler, MD;  Location: [facility] OR;  Service: General;  Laterality: N/A;    Family History  Problem Relation Age of Onset  . Hypertension Mother   . Alcohol abuse Neg Hx   . Arthritis Neg Hx   . Asthma Neg Hx   . Birth defects Neg Hx   . Cancer Neg Hx   . COPD Neg Hx   . Depression Neg Hx   . Diabetes Neg Hx   . Drug abuse Neg Hx   . Early death Neg Hx   . Hearing loss Neg Hx   . Heart disease Neg Hx   . Hyperlipidemia Neg Hx   . Learning disabilities Neg Hx   . Kidney disease Neg Hx   . Mental illness Neg Hx   . Mental retardation Neg Hx   . Miscarriages / Stillbirths Neg Hx   . Stroke Neg Hx   . Vision loss Neg Hx    Social History:  reports that she has never smoked. She has never used smokeless  tobacco. She reports that she does not drink alcohol and does not use drugs.  Allergies: No Known Allergies  No medications prior to admission.    Results for orders placed or performed during the hospital encounter of 06/13/20 (from the past 48 hour(s))  SARS CORONAVIRUS 2 (TAT 6-24 HRS) Nasopharyngeal Nasopharyngeal Swab     Status: None   Collection Time: 06/13/20  9:53 AM   Specimen: Nasopharyngeal Swab  Result Value Ref Range   SARS Coronavirus 2 NEGATIVE NEGATIVE    Comment: (NOTE) SARS-CoV-2 target nucleic acids are NOT DETECTED.  The SARS-CoV-2 RNA is generally detectable in upper and lower respiratory specimens during the acute phase of infection. Negative results do not preclude SARS-CoV-2 infection, do not rule out co-infections with other pathogens, and should not be used as the sole basis for treatment or other patient management decisions. Negative results must be combined with clinical observations, patient history, and epidemiological information. The expected result is Negative.  Fact Sheet for Patients: HairSlick.no  Fact Sheet for Healthcare Providers: quierodirigir.com  This test is not yet approved or cleared by the [place] FDA and  has been authorized for detection and/or diagnosis of SARS-CoV-2 by FDA under an Emergency Use Authorization (EUA). This EUA will remain  in effect (meaning this  test can be used) for the duration of the COVID-19 declaration under Se ction 564(b)(1) of the Act, 21 U.S.C. section 360bbb-3(b)(1), unless the authorization is terminated or revoked sooner.  Performed at [facility], [address] [place], [place] [address]    No results found.  ROS NO RECENT ILLNESSES OR HOSPITALIZATIONS  Height 5\' 9"  (1.753 m), weight 78.9 kg, last menstrual period 05/30/2020. Physical Exam  General Appearance:  Alert, cooperative, no distress, appears stated age  Head:   Normocephalic, without obvious abnormality, atraumatic  Eyes:  Pupils equal, conjunctiva/corneas clear,         Throat: Lips, mucosa, and tongue normal; teeth and gums normal  Neck: No visible masses     Lungs:   respirations unlabored  Chest Wall:  No tenderness or deformity  Heart:  Regular rate and rhythm,  Abdomen:   Soft, non-tender,         Extremities: LUE: block in place fingers warm well perfused Good cap refil  Pulses: 2+ and symmetric  Skin: Skin color, texture, turgor normal, no rashes or lesions     Neurologic: Normal    Assessment/Plan LEFT RING AND SMALL FINGER TENDON ADHESIONS    - LEFT RING AND SMALL FINGER SCAR EXCISIONS AND TENOLYSIS OF FDS/FDP REPAIRS    WE ARE PLANNING SURGERY FOR YOUR UPPER EXTREMITY. THE RISKS AND BENEFITS OF SURGERY INCLUDE BUT NOT LIMITED TO BLEEDING INFECTION, DAMAGE TO NEARBY NERVES ARTERIES TENDONS, FAILURE OF SURGERY TO ACCOMPLISH ITS INTENDED GOALS, PERSISTENT SYMPTOMS AND NEED FOR FURTHER SURGICAL INTERVENTION. WITH THIS IN MIND WE WILL PROCEED. I HAVE DISCUSSED WITH THE PATIENT THE PRE AND POSTOPERATIVE REGIMEN AND THE DOS AND DON'TS. PT VOICED UNDERSTANDING AND INFORMED CONSENT SIGNED.  R/B/A DISCUSSED WITH PT IN OFFICE.  PT VOICED UNDERSTANDING OF PLAN CONSENT SIGNED DAY OF SURGERY PT SEEN AND EXAMINED PRIOR TO OPERATIVE PROCEDURE/DAY OF SURGERY SITE MARKED. QUESTIONS ANSWERED WILL GO HOME FOLLOWING SURGERY  Divit Stipp [facility] MD 06/15/20 08/13/20 06/14/2020, 11:45 AM

## 2020-06-15 ENCOUNTER — Ambulatory Visit (HOSPITAL_BASED_OUTPATIENT_CLINIC_OR_DEPARTMENT_OTHER): Payer: Self-pay | Admitting: Certified Registered"

## 2020-06-15 ENCOUNTER — Encounter (HOSPITAL_BASED_OUTPATIENT_CLINIC_OR_DEPARTMENT_OTHER): Payer: Self-pay | Admitting: Orthopedic Surgery

## 2020-06-15 ENCOUNTER — Ambulatory Visit (HOSPITAL_BASED_OUTPATIENT_CLINIC_OR_DEPARTMENT_OTHER)
Admission: RE | Admit: 2020-06-15 | Discharge: 2020-06-15 | Disposition: A | Discharge status: Home / Self Care | Payer: Self-pay | Attending: Orthopedic Surgery | Admitting: Orthopedic Surgery

## 2020-06-15 ENCOUNTER — Encounter (HOSPITAL_BASED_OUTPATIENT_CLINIC_OR_DEPARTMENT_OTHER)
Admission: RE | Disposition: A | Discharge status: Home / Self Care | Payer: Self-pay | Source: Home / Self Care | Attending: Orthopedic Surgery

## 2020-06-15 ENCOUNTER — Other Ambulatory Visit: Payer: Self-pay

## 2020-06-15 ENCOUNTER — Other Ambulatory Visit: Payer: Self-pay | Admitting: Orthopedic Surgery

## 2020-06-15 DIAGNOSIS — M24642 Ankylosis, left hand: Secondary | ICD-10-CM | POA: Insufficient documentation

## 2020-06-15 DIAGNOSIS — S66822D Laceration of other specified muscles, fascia and tendons at wrist and hand level, left hand, subsequent encounter: Secondary | ICD-10-CM

## 2020-06-15 DIAGNOSIS — Z8759 Personal history of other complications of pregnancy, childbirth and the puerperium: Secondary | ICD-10-CM | POA: Insufficient documentation

## 2020-06-15 HISTORY — PX: TENOLYSIS: SHX396

## 2020-06-15 LAB — POCT PREGNANCY, URINE: Preg Test, Ur: NEGATIVE

## 2020-06-15 SURGERY — INCISION, TENDON SHEATH
Anesthesia: Monitor Anesthesia Care | Site: Finger | Laterality: Left

## 2020-06-15 MED ORDER — ROPIVACAINE HCL 5 MG/ML IJ SOLN
INTRAMUSCULAR | Status: DC | PRN
  Administered 2020-06-15: 30 mL via PERINEURAL

## 2020-06-15 MED ORDER — CEFAZOLIN SODIUM-DEXTROSE 2-4 GM/100ML-% IV SOLN
2.0000 g | INTRAVENOUS | Status: AC
  Administered 2020-06-15: 2 g via INTRAVENOUS

## 2020-06-15 MED ORDER — DEXAMETHASONE SODIUM PHOSPHATE 10 MG/ML IJ SOLN
INTRAMUSCULAR | Status: DC | PRN
  Administered 2020-06-15: 4 mg

## 2020-06-15 MED ORDER — ONDANSETRON HCL 4 MG/2ML IJ SOLN
INTRAMUSCULAR | Status: DC | PRN
  Administered 2020-06-15: 4 mg via INTRAVENOUS

## 2020-06-15 MED ORDER — LIDOCAINE HCL (CARDIAC) PF 100 MG/5ML IV SOSY
PREFILLED_SYRINGE | INTRAVENOUS | Status: DC | PRN
  Administered 2020-06-15: 20 mg via INTRAVENOUS

## 2020-06-15 MED ORDER — OXYCODONE HCL 5 MG PO TABS: 5.0000 mg | ORAL_TABLET | Freq: Once | ORAL | Status: DC | PRN

## 2020-06-15 MED ORDER — BSS IO SOLN
INTRAOCULAR | Status: AC
  Filled 2020-06-15: qty 15

## 2020-06-15 MED ORDER — PROPOFOL 500 MG/50ML IV EMUL
INTRAVENOUS | Status: DC | PRN
  Administered 2020-06-15: 125 ug/kg/min via INTRAVENOUS
  Administered 2020-06-15: 150 ug/kg/min via INTRAVENOUS

## 2020-06-15 MED ORDER — KETOROLAC TROMETHAMINE 0.5 % OP SOLN
OPHTHALMIC | Status: AC
  Filled 2020-06-15: qty 5

## 2020-06-15 MED ORDER — FENTANYL CITRATE (PF) 100 MCG/2ML IJ SOLN: 25.0000 ug | INTRAMUSCULAR | Status: DC | PRN

## 2020-06-15 MED ORDER — KETOROLAC TROMETHAMINE 0.5 % OP SOLN
1.0000 [drp] | Freq: Three times a day (TID) | OPHTHALMIC | Status: DC | PRN
  Administered 2020-06-15: 1 [drp] via OPHTHALMIC

## 2020-06-15 MED ORDER — LACTATED RINGERS IV SOLN: INTRAVENOUS | Status: DC

## 2020-06-15 MED ORDER — MIDAZOLAM HCL 2 MG/2ML IJ SOLN
2.0000 mg | Freq: Once | INTRAMUSCULAR | Status: AC
  Administered 2020-06-15: 2 mg via INTRAVENOUS

## 2020-06-15 MED ORDER — PROPOFOL 10 MG/ML IV BOLUS
INTRAVENOUS | Status: DC | PRN
  Administered 2020-06-15: 20 mg via INTRAVENOUS

## 2020-06-15 MED ORDER — AMISULPRIDE (ANTIEMETIC) 5 MG/2ML IV SOLN: 10.0000 mg | Freq: Once | INTRAVENOUS | Status: DC | PRN

## 2020-06-15 MED ORDER — BSS IO SOLN
15.0000 mL | Freq: Once | INTRAOCULAR | Status: AC
  Administered 2020-06-15: 15 mL

## 2020-06-15 MED ORDER — MIDAZOLAM HCL 2 MG/2ML IJ SOLN
INTRAMUSCULAR | Status: AC
  Filled 2020-06-15: qty 2

## 2020-06-15 MED ORDER — OXYCODONE HCL 5 MG/5ML PO SOLN: 5.0000 mg | Freq: Once | ORAL | Status: DC | PRN

## 2020-06-15 MED ORDER — FENTANYL CITRATE (PF) 100 MCG/2ML IJ SOLN
INTRAMUSCULAR | Status: AC
  Filled 2020-06-15: qty 2

## 2020-06-15 MED ORDER — ONDANSETRON HCL 4 MG/2ML IJ SOLN: 4.0000 mg | Freq: Once | INTRAMUSCULAR | Status: DC | PRN

## 2020-06-15 MED ORDER — FENTANYL CITRATE (PF) 100 MCG/2ML IJ SOLN
100.0000 ug | Freq: Once | INTRAMUSCULAR | Status: AC
  Administered 2020-06-15: 100 ug via INTRAVENOUS

## 2020-06-15 SURGICAL SUPPLY — 55 items
BENZOIN TINCTURE PRP APPL 2/3 (GAUZE/BANDAGES/DRESSINGS) IMPLANT
BLADE SURG 15 STRL LF DISP TIS (BLADE) ×1 IMPLANT
BLADE SURG 15 STRL SS (BLADE) ×1
BNDG ELASTIC 3X5.8 VLCR STR LF (GAUZE/BANDAGES/DRESSINGS) ×2 IMPLANT
BNDG ELASTIC 4X5.8 VLCR STR LF (GAUZE/BANDAGES/DRESSINGS) IMPLANT
BNDG ESMARK 4X9 LF (GAUZE/BANDAGES/DRESSINGS) ×2 IMPLANT
BNDG GAUZE ELAST 4 BULKY (GAUZE/BANDAGES/DRESSINGS) ×2 IMPLANT
CORD BIPOLAR FORCEPS 12FT (ELECTRODE) ×2 IMPLANT
COVER BACK TABLE 60X90IN (DRAPES) ×2 IMPLANT
COVER MAYO STAND STRL (DRAPES) ×2 IMPLANT
COVER WAND RF STERILE (DRAPES) IMPLANT
CUFF TOURN SGL QUICK 18X4 (TOURNIQUET CUFF) ×2 IMPLANT
DRAPE EXTREMITY T 121X128X90 (DISPOSABLE) ×2 IMPLANT
DRAPE SURG 17X23 STRL (DRAPES) ×2 IMPLANT
DRSG EMULSION OIL 3X3 NADH (GAUZE/BANDAGES/DRESSINGS) IMPLANT
GAUZE SPONGE 4X4 12PLY STRL (GAUZE/BANDAGES/DRESSINGS) ×2 IMPLANT
GLOVE BIOGEL PI IND STRL 8.5 (GLOVE) ×1 IMPLANT
GLOVE BIOGEL PI INDICATOR 8.5 (GLOVE) ×1
GLOVE SURG ENC MOIS LTX SZ6.5 (GLOVE) ×2 IMPLANT
GLOVE SURG ORTHO 8.0 STRL STRW (GLOVE) ×2 IMPLANT
GLOVE SURG UNDER POLY LF SZ6.5 (GLOVE) ×2 IMPLANT
GOWN STRL REUS W/ TWL LRG LVL3 (GOWN DISPOSABLE) ×2 IMPLANT
GOWN STRL REUS W/ TWL XL LVL3 (GOWN DISPOSABLE) ×1 IMPLANT
GOWN STRL REUS W/TWL LRG LVL3 (GOWN DISPOSABLE) ×2
GOWN STRL REUS W/TWL XL LVL3 (GOWN DISPOSABLE) ×1
LOOP VESSEL MAXI BLUE (MISCELLANEOUS) IMPLANT
LOOP VESSEL MINI RED (MISCELLANEOUS) IMPLANT
NEEDLE HYPO 25X1 1.5 SAFETY (NEEDLE) IMPLANT
NS IRRIG 1000ML POUR BTL (IV SOLUTION) ×2 IMPLANT
PACK BASIN DAY SURGERY FS (CUSTOM PROCEDURE TRAY) ×2 IMPLANT
PAD CAST 4YDX4 CTTN HI CHSV (CAST SUPPLIES) IMPLANT
PADDING CAST ABS 4INX4YD NS (CAST SUPPLIES) ×1
PADDING CAST ABS COTTON 4X4 ST (CAST SUPPLIES) ×1 IMPLANT
PADDING CAST COTTON 4X4 STRL (CAST SUPPLIES)
SLEEVE SCD COMPRESS KNEE MED (MISCELLANEOUS) ×2 IMPLANT
SPLINT FIBERGLASS 3X35 (CAST SUPPLIES) IMPLANT
SPLINT FIBERGLASS 4X30 (CAST SUPPLIES) IMPLANT
STOCKINETTE 4X48 STRL (DRAPES) ×2 IMPLANT
STRIP CLOSURE SKIN 1/2X4 (GAUZE/BANDAGES/DRESSINGS) IMPLANT
SUCTION FRAZIER HANDLE 10FR (MISCELLANEOUS)
SUCTION TUBE FRAZIER 10FR DISP (MISCELLANEOUS) IMPLANT
SUT ETHIBOND 3-0 V-5 (SUTURE) IMPLANT
SUT ETHILON 4 0 PS 2 18 (SUTURE) IMPLANT
SUT MERSILENE 4 0 P 3 (SUTURE) IMPLANT
SUT MNCRL AB 4-0 PS2 18 (SUTURE) IMPLANT
SUT PROLENE 4 0 PS 2 18 (SUTURE) ×4 IMPLANT
SUT VIC AB 2-0 SH 27 (SUTURE)
SUT VIC AB 2-0 SH 27XBRD (SUTURE) IMPLANT
SUT VIC AB 3-0 FS2 27 (SUTURE) IMPLANT
SUT VICRYL 4-0 PS2 18IN ABS (SUTURE) IMPLANT
SYR BULB EAR ULCER 3OZ GRN STR (SYRINGE) ×2 IMPLANT
SYR CONTROL 10ML LL (SYRINGE) IMPLANT
TRAY DSU PREP LF (CUSTOM PROCEDURE TRAY) ×2 IMPLANT
TUBE CONNECTING 20X1/4 (TUBING) IMPLANT
UNDERPAD 30X36 HEAVY ABSORB (UNDERPADS AND DIAPERS) ×2 IMPLANT

## 2020-06-15 NOTE — Progress Notes (Signed)
Assisted Dr. Witman with left, ultrasound guided, supraclavicular block. Side rails up, monitors on throughout procedure. See vital signs in flow sheet. Tolerated Procedure well. °

## 2020-06-15 NOTE — Anesthesia Procedure Notes (Signed)
Anesthesia Regional Block: Supraclavicular block   Pre-Anesthetic Checklist: ,, timeout performed, Correct Patient, Correct Site, Correct Laterality, Correct Procedure, Correct Position, site marked, Risks and benefits discussed,  Surgical consent,  Pre-op evaluation,  At surgeon's request and post-op pain management  Laterality: Left  Prep: chloraprep       Needles:  Injection technique: Single-shot  Needle Type: Echogenic Stimulator Needle     Needle Length: 10cm  Needle Gauge: 20     Additional Needles:   Procedures:,,,, ultrasound used (permanent image in chart),,,,  Narrative:  Start time: 06/15/2020 2:10 PM End time: 06/15/2020 2:14 PM Injection made incrementally with aspirations every 5 mL.  Performed by: Personally  Anesthesiologist: Lucretia Kern, MD  Additional Notes: Standard monitors applied. Skin prepped. Good needle visualization with ultrasound. Injection made in 5cc increments with no resistance to injection. Patient tolerated the procedure well.

## 2020-06-15 NOTE — Anesthesia Preprocedure Evaluation (Signed)
Anesthesia Evaluation  Patient identified by MRN, date of birth, ID band Patient awake    Reviewed: Allergy & Precautions, NPO status , Patient's Chart, lab work & pertinent test results  History of Anesthesia Complications Negative for: history of anesthetic complications  Airway Mallampati: II  TM Distance: >3 FB Neck ROM: Full    Dental  (+) Teeth Intact   Pulmonary neg pulmonary ROS,    Pulmonary exam normal        Cardiovascular hypertension, negative cardio ROS Normal cardiovascular exam     Neuro/Psych negative neurological ROS  negative psych ROS   GI/Hepatic negative GI ROS, Neg liver ROS,   Endo/Other  negative endocrine ROS  Renal/GU negative Renal ROS  negative genitourinary   Musculoskeletal negative musculoskeletal ROS (+)   Abdominal   Peds  Hematology negative hematology ROS (+)   Anesthesia Other Findings   Reproductive/Obstetrics                             Anesthesia Physical Anesthesia Plan  ASA: I  Anesthesia Plan: MAC and Regional   Post-op Pain Management:    Induction: Intravenous  PONV Risk Score and Plan: 2 and Propofol infusion, TIVA and Treatment may vary due to age or medical condition  Airway Management Planned: Natural Airway, Nasal Cannula and Simple Face Mask  Additional Equipment: None  Intra-op Plan:   Post-operative Plan:   Informed Consent: I have reviewed the patients History and Physical, chart, labs and discussed the procedure including the risks, benefits and alternatives for the proposed anesthesia with the patient or authorized representative who has indicated his/her understanding and acceptance.       Plan Discussed with:   Anesthesia Plan Comments:         Anesthesia Quick Evaluation

## 2020-06-15 NOTE — Anesthesia Postprocedure Evaluation (Signed)
Anesthesia Post Note  Patient: Bridget Huffman  Procedure(s) Performed: Left ring and small finger scar excision and tenolysis of Flexor digitorum superficialis/Flexor digitorum profundus repairs (Left Finger)     Patient location during evaluation: PACU Anesthesia Type: Regional Level of consciousness: awake and alert Pain management: pain level controlled Vital Signs Assessment: post-procedure vital signs reviewed and stable Respiratory status: spontaneous breathing, nonlabored ventilation and respiratory function stable Cardiovascular status: blood pressure returned to baseline and stable Postop Assessment: no apparent nausea or vomiting Anesthetic complications: no   No complications documented.  Last Vitals:  Vitals:   06/15/20 1420 06/15/20 1645  BP:  110/75  Pulse: 74 76  Resp: 13 16  Temp:  (!) 36.4 C  SpO2: 100% 99%    Last Pain:  Vitals:   06/15/20 1645  TempSrc:   PainSc: 0-No pain                 Lucretia Kern

## 2020-06-15 NOTE — Op Note (Signed)
PREOPERATIVE DIAGNOSIS: Left ring finger tendon adhesions Left small finger tendon adhesions  POSTOPERATIVE DIAGNOSIS: Same  ATTENDING SURGEON: Dr. Bradly Bienenstock who scrubbed and present for the entire procedure  ASSISTANT SURGEON: Lambert Mody, [facility] who scrubbed and necessary for the entire procedure  ANESTHESIA: Regional with IV sedation  OPERATIVE PROCEDURE: Left ring finger tendon tenolysis flexor digitorum superficialis finger and palm Left ring finger tendon tenolysis flexor digitorum profundus finger and palm Left small finger tendon tenolysis flexor digitorum superficialis finger and palm Left small finger tendon tenolysis flexor digitorum profundus finger and palm  IMPLANTS: None  EBL: Minimal  RADIOGRAPHIC INTERPRETATION: None  SURGICAL INDICATIONS: Patient is a right-hand-dominant female who sustained the injury on December 13, 2019.  Patient underwent repair of the FDS and FDP.  Patient went through significant course of therapy and unfortunately was having difficulty with limitations in her movement.  Patient elected undergo the above procedure.  The risks of surgery include but not limited to bleeding infection damage nearby nerves arteries or tendons loss of motion of the wrist and digits incomplete relief of symptoms and need for further surgical invention.  SURGICAL TECHNIQUE: Patient was palpated find the preoperative holding area marked for marker made on left hand indicate correct operative site.  Patient brought back operating placed supine on anesthesia table where the regional anesthetic was administered.  Patient tolerated this well.  Well-padded tourniquet placed on the left brachium seal with the appropriate drape.  Left upper extremities then prepped and draped normal sterile fashion.  A timeout was called the correct identified procedure then begun.  The previous incision was then marked out.  The limb was then elevated tourniquet insufflated.  Preoperative  antibiotics were given prior to skin incision.  Dissection carried down through the skin and subcutaneous tissue.  The flexor digitorum superficialis and profundus were then identified to the ring finger in the palm.  Careful protection was then done of the neurovascular bundles on the radial ulnar aspect.  Tenolysis was then carried out of the FDS.  This was done in the palm extending into the finger.  The patient did have the bulking of the FDS and FDP at the entrance and region of the A2 pulley.  The patient had poor quality of the ulnar slip of the FDS and the FDS ulnar slip was resected to debulk the contents of the flexor sheath.  There is good excursion of the FDP and FDP S after both of the tendons had undergone tendon adhesion release, tenolysis.  There is good excursion of both tendons with gentle retraction of the tendons.  The wound was then thoroughly irrigated.  Following this attention was then turned to the same incision to the similar technique to the small finger.  The FDS and FDP were both identified in the palm tenolysis of both tendons was then carried out extending into the finger.  These were grossly adherent to each other within the palm extending into the finger.  There was good separation of both tendons with good excursion of the tendons with gentle manipulation of the and grasping of the tendons.  The wound was then thoroughly irrigated.  After copious wound irrigation and release skin flaps were then closed using simple Prolene suture.  Adaptic dressing sterile compressive bandage then applied.  The patient tolerated the procedure well returned to recovery room in good condition.  POSTOPERATIVE PLAN: Patient be discharged to home.  See him back in the office on Friday for wound check.  We will  make sure that she gets into therapy next week to begin a regimen to begin working on active range of motion of the small and ring fingers.  Being very aggressive with the mobility. Sutures out  around the 2-week mark.

## 2020-06-15 NOTE — Transfer of Care (Signed)
Immediate Anesthesia Transfer of Care Note  Patient: Bridget Huffman  Procedure(s) Performed: Left ring and small finger scar excision and tenolysis of Flexor digitorum superficialis/Flexor digitorum profundus repairs (Left Finger)  Patient Location: PACU  Anesthesia Type:MAC combined with regional for post-op pain  Level of Consciousness: drowsy  Airway & Oxygen Therapy: Patient Spontanous Breathing and Patient connected to face mask oxygen  Post-op Assessment: Report given to RN and Post -op Vital signs reviewed and stable  Post vital signs: Reviewed and stable  Last Vitals:  Vitals Value Taken Time  BP 110/75 06/15/20 1645  Temp    Pulse 76 06/15/20 1645  Resp 16 06/15/20 1645  SpO2 99 % 06/15/20 1645  Vitals shown include unvalidated device data.  Last Pain:  Vitals:   06/15/20 1313  TempSrc: Oral  PainSc: 0-No pain      Patients Stated Pain Goal: 4 (84/53/64 6803)  Complications: No complications documented.

## 2020-06-15 NOTE — Discharge Instructions (Addendum)
KEEP BANDAGE CLEAN AND DRY CALL OFFICE FOR F/U APPT [phone] IN 2 DAYS RX SENT TO [facility] - Percocet 5-325mg  KEEP HAND ELEVATED ABOVE HEART OK TO APPLY ICE TO OPERATIVE AREA CONTACT OFFICE IF ANY WORSENING PAIN OR CONCERNS.   Post Anesthesia Home Care Instructions  Activity: Get plenty of rest for the remainder of the day. A responsible individual must stay with you for 24 hours following the procedure.  For the next 24 hours, DO NOT: -Drive a car -Advertising copywriter -Drink alcoholic beverages -Take any medication unless instructed by your physician -Make any legal decisions or sign important papers.  Meals: Start with liquid foods such as gelatin or soup. Progress to regular foods as tolerated. Avoid greasy, spicy, heavy foods. If nausea and/or vomiting occur, drink only clear liquids until the nausea and/or vomiting subsides. Call your physician if vomiting continues.  Special Instructions/Symptoms: Your throat may feel dry or sore from the anesthesia or the breathing tube placed in your throat during surgery. If this causes discomfort, gargle with warm salt water. The discomfort should disappear within 24 hours.  If you had a scopolamine patch placed behind your ear for the management of post- operative nausea and/or vomiting:  1. The medication in the patch is effective for 72 hours, after which it should be removed.  Wrap patch in a tissue and discard in the trash. Wash hands thoroughly with soap and water. 2. You may remove the patch earlier than 72 hours if you experience unpleasant side effects which may include dry mouth, dizziness or visual disturbances. 3. Avoid touching the patch. Wash your hands with soap and water after contact with the patch.    Regional Anesthesia Blocks  1. Numbness or the inability to move the "blocked" extremity may last from 3-48 hours after placement. The length of time depends on the medication injected and your individual response to the  medication. If the numbness is not going away after 48 hours, call your surgeon.  2. The extremity that is blocked will need to be protected until the numbness is gone and the  Strength has returned. Because you cannot feel it, you will need to take extra care to avoid injury. Because it may be weak, you may have difficulty moving it or using it. You may not know what position it is in without looking at it while the block is in effect.  3. For blocks in the legs and feet, returning to weight bearing and walking needs to be done carefully. You will need to wait until the numbness is entirely gone and the strength has returned. You should be able to move your leg and foot normally before you try and bear weight or walk. You will need someone to be with you when you first try to ensure you do not fall and possibly risk injury.  4. Bruising and tenderness at the needle site are common side effects and will resolve in a few days.  5. Persistent numbness or new problems with movement should be communicated to the surgeon or the [facility] [id number] [facility] [phone]).

## 2020-06-16 ENCOUNTER — Encounter (HOSPITAL_BASED_OUTPATIENT_CLINIC_OR_DEPARTMENT_OTHER): Payer: Self-pay | Admitting: Orthopedic Surgery

## 2020-06-21 ENCOUNTER — Other Ambulatory Visit: Payer: Self-pay

## 2020-06-21 ENCOUNTER — Ambulatory Visit: Payer: Medicaid Other | Attending: Orthopedic Surgery | Admitting: Occupational Therapy

## 2020-06-21 ENCOUNTER — Encounter: Payer: Self-pay | Admitting: Occupational Therapy

## 2020-06-21 ENCOUNTER — Ambulatory Visit: Payer: Medicaid Other | Admitting: Occupational Therapy

## 2020-06-21 DIAGNOSIS — M79642 Pain in left hand: Secondary | ICD-10-CM | POA: Insufficient documentation

## 2020-06-21 DIAGNOSIS — M6281 Muscle weakness (generalized): Secondary | ICD-10-CM | POA: Insufficient documentation

## 2020-06-21 DIAGNOSIS — M25642 Stiffness of left hand, not elsewhere classified: Secondary | ICD-10-CM | POA: Insufficient documentation

## 2020-06-21 DIAGNOSIS — R600 Localized edema: Secondary | ICD-10-CM | POA: Insufficient documentation

## 2020-06-21 NOTE — Therapy (Signed)
[facility] [address] [place], [place], [address] Phone: [phone]   Fax:  [phone]  Occupational Therapy Treatment  Patient Details  Name: Bridget Huffman MRN: [id number] Date of Birth: 09-03-1985 Referring Provider (OT): Dr. Melvyn Novas   Encounter Date: 06/21/2020   OT End of Session - 06/21/20 1533    Visit Number 1    Number of Visits 17    Date for OT Re-Evaluation 08/16/20    Authorization Type CAFA through 10/05/20    OT Start Time 1105    OT Stop Time 1213    OT Time Calculation (min) 68 min    Activity Tolerance Patient tolerated treatment well    Behavior During Therapy [facility] for tasks assessed/performed           Past Medical History:  Diagnosis Date  . Hx of preeclampsia, prior pregnancy, currently pregnant   . No pertinent past medical history   . Pregnancy induced hypertension    previous pregnancy    Past Surgical History:  Procedure Laterality Date  . APPENDECTOMY    . CESAREAN SECTION N/A 01/21/2017   Procedure: CESAREAN SECTION;  Surgeon: Marlow Baars, MD;  Location: [facility] BIRTHING SUITES;  Service: Obstetrics;  Laterality: N/A;  . LAPAROSCOPIC APPENDECTOMY N/A 05/31/2014   Procedure: APPENDECTOMY LAPAROSCOPIC;  Surgeon: Axel Filler, MD;  Location: [facility] OR;  Service: General;  Laterality: N/A;  . TENOLYSIS Left 06/15/2020   Procedure: Left ring and small finger scar excision and tenolysis of Flexor digitorum superficialis/Flexor digitorum profundus repairs;  Surgeon: Bradly Bienenstock, MD;  Location: [facility];  Service: Orthopedics;  Laterality: Left;  with IV sedation needs 90 minutes    There were no vitals filed for this visit.   Subjective Assessment - 06/21/20 1124    Subjective  Pt denies pain    Pertinent History tenolysis surgery 06/15/20    Currently in Pain? No/denies , mild pain with exercises  grossly 3-4 out of 10             [facility] OT Assessment -  06/21/20 1125      Assessment   Medical Diagnosis tenolysis surgery due to tendon adhesions, hx of flexor tendon repair on December 13, 2019    Referring Provider (OT) Dr. Melvyn Novas    Onset Date/Surgical Date 06/15/20    Next MD Visit 06/28/20      Precautions   Precautions Fall;Other (comment)    Precaution Comments cleared for A/ROM week of 06/20/20, (per op note MD stated to be aggressive with mobility)      Home  Environment   Family/patient expects to be discharged to: Private residence    Lives With Spouse   8 yr, 3 yr old     Prior Function   Level of Independence Independent    Vocation Part time employment    Vocation Requirements typing      ADL   Eating/Feeding Modified independent    Grooming Modified independent    Upper Body Bathing Modified independent    Lower Body Bathing Modified independent    Upper Body Dressing Independent      Mobility   Mobility Status Independent      Cognition   Overall Cognitive Status Within Functional Limits for tasks assessed      Observation/Other Assessments   Skin Integrity Pt arrived with hand wrapped in dressing covered with Ace bandage. Pt was carefully unwrapped. Stitches in place over incision for ring finger and palm at base  of ring finger. Hand was cleaned gently with a wash cloth and soap and water aound incision. Mild bleeding from palmar incision. Pt's hand was dried thoroughly and then dressed with 2x 2 gauze and stockinette, finger stockinette applied over ring finger. Pt's palm was swollen and pink in coloration. Pt was instructed in HEP. Pt had some bleeding with exercises so dressing was changed and reapplied before pt left. Pt's incision was intact and stitches in place.      Sensation   Light Touch Appears Intact      Coordination   Gross Motor Movements are Fluid and Coordinated No    Fine Motor Movements are Fluid and Coordinated No      ROM / Strength   AROM / PROM / Strength AROM      AROM   Overall AROM   Deficits      Left Hand PROM   L Ring  MCP 0-90 30 Degrees   -10 ext   L Ring PIP 0-100 35 Degrees   -25   L Little  MCP 0-90 25 Degrees   -10   L Little PIP 0-100 60 Degrees   -20 ext     Hand Function   Left Hand Grip (lbs) --   not tested due to precautions            Therapist received notification from MD office that a splint was not needed.         OT Education - 06/21/20 1603    Education Details Hand hygeine, dressing changes, inital HEP from tenolysis protocol for: P/ROM finger composite flexion place and hold then finger extension,  finger flexion composite place and hold, then transition to PIP flexion  with MP extension to finger extension with buddy loop on ring and middle  finger, as well as P/ROM/AA/ROM finger flexion then A/ROM finger extension and wrist extension.    Person(s) Educated Patient    Methods Explanation;Verbal cues;Handout;Demonstration;Tactile cues    Comprehension Verbalized understanding;Returned demonstration;Verbal cues required;Need further instruction                       OT Short Term Goals - 06/21/20 1553      OT SHORT TERM GOAL #1   Title Pt will verbalize understanding of hand hygine/ dressing changes    Time 4    Period Weeks    Status New      OT SHORT TERM GOAL #2   Title I with inital HEP    Time 4    Period Weeks    Status New      OT SHORT TERM GOAL #3   Title I with scar massage and edema control prn.    Time 4    Period Weeks    Status New             OT Long Term Goals - 06/21/20 1554      OT LONG TERM GOAL #1   Title I with updated HEP.    Time 8    Period Weeks    Status New      OT LONG TERM GOAL #2   Title Pt will resume use of LUE as a non dominant assist at least 90% of the time for ADLs/ IADLs  with pain less than or equal to 2/10.    Time 8    Period Weeks    Status New      OT LONG TERM GOAL #3  Title Pt will demonstrate grip strength of at least 25 lbs for increased  functional use during ADLs    Time 8    Period Weeks    Status New      OT LONG TERM GOAL #4   Title Pt will demonstrate at least 80% composite flexion for ring and small finger for increased functional use.    Time 8    Period Weeks    Status New                 Plan - 06/21/20 1537    Clinical Impression Statement Pt is a 35 y.o female s/p tenolysis surgery to LUE on 06/15/20 due to tendon ahesions from flexor tendon repair on December 13, 2019. Pt presents to occupational therapy with the following deficits: decreased ROM, decreased strength, decreased LUE functional use, pain, edema which impedes performance of ADLs/ IADLS. Pt can benefit from skilled occupational therapy to address these deficits in order to maximize pt's safety and I with ADLs/ IADLs.    OT Occupational Profile and History Problem Focused Assessment - Including review of records relating to presenting problem    Occupational performance deficits (Please refer to evaluation for details): ADL's;IADL's;Work;Leisure;Social Participation    Body Structure / Function / Physical Skills ADL;UE functional use;Flexibility;FMC;Pain;ROM;Scar mobility;Decreased knowledge of precautions;Decreased knowledge of use of DME;IADL;Dexterity;Strength;Mobility;Skin integrity;Coordination    Rehab Potential Good    Clinical Decision Making Limited treatment options, no task modification necessary    Modification or Assistance to Complete Evaluation  No modification of tasks or assist necessary to complete eval    OT Frequency 2x / week   plus eval or 17 visits total   OT Duration 8 weeks    OT Treatment/Interventions Self-care/ADL training;Ultrasound;Scar mobilization;Patient/family education;Passive range of motion;Paraffin;Cryotherapy;Fluidtherapy;Splinting;Contrast Bath;Electrical Stimulation;Moist Heat;Therapeutic exercise;Manual Therapy;Therapeutic activities;Neuromuscular education    Plan reveiw and progress HEP as able            Patient will benefit from skilled therapeutic intervention in order to improve the following deficits and impairments:   Body Structure / Function / Physical Skills: ADL,UE functional use,Flexibility,FMC,Pain,ROM,Scar mobility,Decreased knowledge of precautions,Decreased knowledge of use of DME,IADL,Dexterity,Strength,Mobility,Skin integrity,Coordination       Visit Diagnosis: Stiffness of left hand, not elsewhere classified - Plan: Ot plan of care cert/re-cert  Muscle weakness (generalized) - Plan: Ot plan of care cert/re-cert  Pain in left hand - Plan: Ot plan of care cert/re-cert  Localized edema - Plan: Ot plan of care cert/re-cert    Problem List Patient Active Problem List   Diagnosis Date Noted  . Decreased fetal movement 01/20/2017  . Perforated appendicitis 06/01/2014  . Acute appendicitis 05/31/2014  . Annual physical exam 02/09/2014  . Pap smear for cervical cancer screening 02/09/2014  . NSVD (normal spontaneous vaginal delivery) 03/29/2012  . Mild preeclampsia delivered 03/29/2012  . GBS (group B streptococcus) UTI complicating pregnancy 08/21/2011    Chaske Paskett 06/21/2020, 4:04 PM Keene Breath, OTR/L [phone] Phone: [phone] 4:04 PM 06/21/20 [facility] [address] [place], [place], [address] Phone: [phone]   Fax:  [phone]  Name: Bridget Huffman MRN: [id number] Date of Birth: [birth year]

## 2020-06-23 ENCOUNTER — Ambulatory Visit: Payer: Medicaid Other | Admitting: Occupational Therapy

## 2020-06-23 ENCOUNTER — Other Ambulatory Visit: Payer: Self-pay

## 2020-06-23 DIAGNOSIS — M79642 Pain in left hand: Secondary | ICD-10-CM

## 2020-06-23 DIAGNOSIS — M6281 Muscle weakness (generalized): Secondary | ICD-10-CM

## 2020-06-23 DIAGNOSIS — R600 Localized edema: Secondary | ICD-10-CM

## 2020-06-23 DIAGNOSIS — M25642 Stiffness of left hand, not elsewhere classified: Secondary | ICD-10-CM

## 2020-06-23 NOTE — Therapy (Signed)
[facility] [address] [place], [place], [address] Phone: [phone]   Fax:  [phone]  Occupational Therapy Treatment  Patient Details  Name: Bridget Huffman MRN: [id number] Date of Birth: 1986-05-28 Referring Provider (OT): Dr. Melvyn Novas   Encounter Date: 06/23/2020   OT End of Session - 06/23/20 1013    Visit Number 2    Number of Visits 17    Date for OT Re-Evaluation 08/16/20    Authorization Type CAFA through 10/05/20    OT Start Time 0805    OT Stop Time 0840    OT Time Calculation (min) 35 min    Activity Tolerance Patient tolerated treatment well    Behavior During Therapy [facility] for tasks assessed/performed           Past Medical History:  Diagnosis Date  . Hx of preeclampsia, prior pregnancy, currently pregnant   . No pertinent past medical history   . Pregnancy induced hypertension    previous pregnancy    Past Surgical History:  Procedure Laterality Date  . APPENDECTOMY    . CESAREAN SECTION N/A 01/21/2017   Procedure: CESAREAN SECTION;  Surgeon: Marlow Baars, MD;  Location: [facility] BIRTHING SUITES;  Service: Obstetrics;  Laterality: N/A;  . LAPAROSCOPIC APPENDECTOMY N/A 05/31/2014   Procedure: APPENDECTOMY LAPAROSCOPIC;  Surgeon: Axel Filler, MD;  Location: [facility] OR;  Service: General;  Laterality: N/A;  . TENOLYSIS Left 06/15/2020   Procedure: Left ring and small finger scar excision and tenolysis of Flexor digitorum superficialis/Flexor digitorum profundus repairs;  Surgeon: Bradly Bienenstock, MD;  Location: [facility];  Service: Orthopedics;  Laterality: Left;  with IV sedation needs 90 minutes    There were no vitals filed for this visit.   Subjective Assessment - 06/23/20 1141    Subjective  Pt denies pain    Pertinent History tenolysis surgery 06/15/20    Currently in Pain? No/denies               Pt's dressing was removed minimal amount of blood present. Hand was  cleaned around incision with soap and water and dried thoroughly. Ring finger was dressed with stockinette and hand was dressed with 2 x 2 , gauze, then topped with tensogrip.  Pt performed finger flexion place and hold followed by wrist flexion with pt performing finger extensionthen  finger composite flexion place and hold then finger extension with buddy loop on middle and ring fingers Followed by finger flexion composite place and hold, then transition to hook fist with MP extension then to finger extension with buddy loop on ring and middle finger,Therapist provided min v.c and assistance for exercises, then pt returned demonstration  Pt was instructed to continue daily dressing changes/ hand hygeine and that she should change dressing if she bleeds through the dressing. Pt is continuing to have mild bleeding but incision appears intact with stitches in place. No s/s of infection.                   OT Short Term Goals - 06/21/20 1553      OT SHORT TERM GOAL #1   Title Pt will verbalize understanding of hand hygine/ dressing changes    Time 4    Period Weeks    Status New      OT SHORT TERM GOAL #2   Title I with inital HEP    Time 4    Period Weeks    Status New  OT SHORT TERM GOAL #3   Title I with scar massage and edema control prn.    Time 4    Period Weeks    Status New             OT Long Term Goals - 06/21/20 1554      OT LONG TERM GOAL #1   Title I with updated HEP.    Time 8    Period Weeks    Status New      OT LONG TERM GOAL #2   Title Pt will resume use of LUE as a non dominant assist at least 90% of the time for ADLs/ IADLs  with pain less than or equal to 2/10.    Time 8    Period Weeks    Status New      OT LONG TERM GOAL #3   Title Pt will demonstrate grip strength of at least 25 lbs for increased functional use during ADLs    Time 8    Period Weeks    Status New      OT LONG TERM GOAL #4   Title Pt will demonstrate at least  80% composite flexion for ring and small finger for increased functional use.    Time 8    Period Weeks    Status New                 Plan - 06/23/20 1142    Clinical Impression Statement Pt is progressing towards goals with improving finger ROM           Patient will benefit from skilled therapeutic intervention in order to improve the following deficits and impairments:           Visit Diagnosis: Stiffness of left hand, not elsewhere classified  Muscle weakness (generalized)  Pain in left hand  Localized edema    Problem List Patient Active Problem List   Diagnosis Date Noted  . Decreased fetal movement 01/20/2017  . Perforated appendicitis 06/01/2014  . Acute appendicitis 05/31/2014  . Annual physical exam 02/09/2014  . Pap smear for cervical cancer screening 02/09/2014  . NSVD (normal spontaneous vaginal delivery) 03/29/2012  . Mild preeclampsia delivered 03/29/2012  . GBS (group B streptococcus) UTI complicating pregnancy 08/21/2011    Bridget Huffman 06/23/2020, 1:05 PM  [facility] [address] [place], [place], [address] Phone: [phone]   Fax:  [phone]  Name: Bridget Huffman MRN: [id number] Date of Birth: 1985/07/20

## 2020-06-29 ENCOUNTER — Ambulatory Visit: Payer: Medicaid Other | Admitting: Occupational Therapy

## 2020-07-01 ENCOUNTER — Encounter: Payer: Self-pay | Admitting: Occupational Therapy

## 2020-07-01 ENCOUNTER — Other Ambulatory Visit: Payer: Self-pay

## 2020-07-01 ENCOUNTER — Ambulatory Visit: Payer: Medicaid Other | Admitting: Occupational Therapy

## 2020-07-01 DIAGNOSIS — M6281 Muscle weakness (generalized): Secondary | ICD-10-CM

## 2020-07-01 DIAGNOSIS — M25642 Stiffness of left hand, not elsewhere classified: Secondary | ICD-10-CM

## 2020-07-01 DIAGNOSIS — M79642 Pain in left hand: Secondary | ICD-10-CM

## 2020-07-01 NOTE — Therapy (Signed)
[facility] [address] [place], [place], [address] Phone: [phone]   Fax:  [phone]  Occupational Therapy Treatment  Patient Details  Name: Bridget Huffman MRN: [id number] Date of Birth: March 18, 1986 Referring Provider (OT): Dr. Melvyn Novas   Encounter Date: 07/01/2020   OT End of Session - 07/01/20 0824    Visit Number 3    Number of Visits 17    Date for OT Re-Evaluation 08/16/20    Authorization Type CAFA through 10/05/20    OT Start Time 0804    OT Stop Time 0840    OT Time Calculation (min) 36 min           Past Medical History:  Diagnosis Date  . Hx of preeclampsia, prior pregnancy, currently pregnant   . No pertinent past medical history   . Pregnancy induced hypertension    previous pregnancy    Past Surgical History:  Procedure Laterality Date  . APPENDECTOMY    . CESAREAN SECTION N/A 01/21/2017   Procedure: CESAREAN SECTION;  Surgeon: Marlow Baars, MD;  Location: [facility] BIRTHING SUIT[facility];  Service: Obstetrics;  Laterality: N/A;  . LAPAROSCOPIC APPENDECTOMY N/A 05/31/2014   Procedure: APPENDECTOMY LAPAROSCOPIC;  Surgeon: Axel Filler, MD;  Location: [facility] OR;  Service: General;  Laterality: N/A;  . TENOLYSIS Left 06/15/2020   Procedure: Left ring and small finger scar excision and tenolysis of Flexor digitorum superficialis/Flexor digitorum profundus repairs;  Surgeon: Bradly Bienenstock, MD;  Location: [facility];  Service: Orthopedics;  Laterality: Left;  with IV sedation needs 90 minutes    There were no vitals filed for this visit.   Subjective Assessment - 07/01/20 0949    Subjective  Pt denies pain    Pertinent History tenolysis surgery 06/15/20    Currently in Pain? No/denies                Treatment: Pt's stockinette was removed, stitches have been removed, incision is healing nicely. Pt reports washing hand in shower.   Ring finger was dressed with stockinette and hand  was dressed  with tensogrip.  Pt performed finger flexion place and hold followed by finger flexion with wrist flexion transitioning to wrist and finger extension  finger then composite flexion place and hold then finger extension with buddy loop on middle and ring fingers Followed by finger flexion composite place and hold, then transition to hook fist with MP extension then to finger extension with buddy loop on ring and middle finger, occasional min v.c A/ROM wrist flexion and extension. NMES to finger flexors preset small muscle groups intensity 22, x 10 mins with pr performing A/ROM/AA/ROM during on cycle , no adverse reactions.                    OT Short Term Goals - 06/21/20 1553      OT SHORT TERM GOAL #1   Title Pt will verbalize understanding of hand hygine/ dressing changes    Time 4    Period Weeks    Status New      OT SHORT TERM GOAL #2   Title I with inital HEP    Time 4    Period Weeks    Status New      OT SHORT TERM GOAL #3   Title I with scar massage and edema control prn.    Time 4    Period Weeks    Status New  OT Long Term Goals - 06/21/20 1554      OT LONG TERM GOAL #1   Title I with updated HEP.    Time 8    Period Weeks    Status New      OT LONG TERM GOAL #2   Title Pt will resume use of LUE as a non dominant assist at least 90% of the time for ADLs/ IADLs  with pain less than or equal to 2/10.    Time 8    Period Weeks    Status New      OT LONG TERM GOAL #3   Title Pt will demonstrate grip strength of at least 25 lbs for increased functional use during ADLs    Time 8    Period Weeks    Status New      OT LONG TERM GOAL #4   Title Pt will demonstrate at least 80% composite flexion for ring and small finger for increased functional use.    Time 8    Period Weeks    Status New                 Plan - 07/01/20 0946    Clinical Impression Statement Pt is progressing towards goals with improving  finger ROM. Stitches were removed this week, pt's incsion is healing.    OT Occupational Profile and History Problem Focused Assessment - Including review of records relating to presenting problem    Occupational performance deficits (Please refer to evaluation for details): ADL's;IADL's;Work;Leisure;Social Participation    Body Structure / Function / Physical Skills ADL;UE functional use;Flexibility;FMC;Pain;ROM;Scar mobility;Decreased knowledge of precautions;Decreased knowledge of use of DME;IADL;Dexterity;Strength;Mobility;Skin integrity;Coordination    OT Frequency 2x / week   or 17 visits total   OT Duration 8 weeks    OT Treatment/Interventions Self-care/ADL training;Ultrasound;Scar mobilization;Patient/family education;Passive range of motion;Paraffin;Cryotherapy;Fluidtherapy;Splinting;Contrast Bath;Electrical Stimulation;Moist Heat;Therapeutic exercise;Manual Therapy;Therapeutic activities;Neuromuscular education    Plan continue to perform A/ROM, AA/ROM and place and hold exerices, NMES, [place] when incision is closed    Consulted and Agree with Plan of Care Patient           Patient will benefit from skilled therapeutic intervention in order to improve the following deficits and impairments:   Body Structure / Function / Physical Skills: ADL,UE functional use,Flexibility,FMC,Pain,ROM,Scar mobility,Decreased knowledge of precautions,Decreased knowledge of use of DME,IADL,Dexterity,Strength,Mobility,Skin integrity,Coordination       Visit Diagnosis: Stiffness of left hand, not elsewhere classified  Muscle weakness (generalized)  Pain in left hand    Problem List Patient Active Problem List   Diagnosis Date Noted  . Decreased fetal movement 01/20/2017  . Perforated appendicitis 06/01/2014  . Acute appendicitis 05/31/2014  . Annual physical exam 02/09/2014  . Pap smear for cervical cancer screening 02/09/2014  . NSVD (normal spontaneous vaginal delivery) 03/29/2012  . Mild  preeclampsia delivered 03/29/2012  . GBS (group B streptococcus) UTI complicating pregnancy 08/21/2011    Mar Zettler 07/01/2020, 9:49 AM Keene Breath, OTR/L [phone] Phone: [phone] 9:50 AM 07/01/20 [facility] [address] [place], [place], [address] Phone: [phone]   Fax:  [phone]  Name: Laylynn Campanella MRN: [id number] Date of Birth: December 11, 1985

## 2020-07-04 ENCOUNTER — Other Ambulatory Visit: Payer: Self-pay

## 2020-07-04 ENCOUNTER — Ambulatory Visit: Payer: Medicaid Other | Admitting: Occupational Therapy

## 2020-07-04 DIAGNOSIS — M25642 Stiffness of left hand, not elsewhere classified: Secondary | ICD-10-CM

## 2020-07-04 DIAGNOSIS — M6281 Muscle weakness (generalized): Secondary | ICD-10-CM

## 2020-07-04 DIAGNOSIS — R600 Localized edema: Secondary | ICD-10-CM

## 2020-07-04 NOTE — Therapy (Signed)
[facility] [address] [place], [place], [address] Phone: [phone]   Fax:  [phone]  Occupational Therapy Treatment  Patient Details  Name: Bridget Huffman MRN: [id number] Date of Birth: 12-Dec-1985 Referring Provider (OT): Dr. Melvyn Novas   Encounter Date: 07/04/2020   OT End of Session - 07/04/20 0830    Visit Number 4    Number of Visits 17    Date for OT Re-Evaluation 08/16/20    Authorization Type CAFA through 10/05/20    OT Start Time 0800    OT Stop Time 0840    OT Time Calculation (min) 40 min    Activity Tolerance Patient tolerated treatment well    Behavior During Therapy [facility] for tasks assessed/performed           Past Medical History:  Diagnosis Date  . Hx of preeclampsia, prior pregnancy, currently pregnant   . No pertinent past medical history   . Pregnancy induced hypertension    previous pregnancy    Past Surgical History:  Procedure Laterality Date  . APPENDECTOMY    . CESAREAN SECTION N/A 01/21/2017   Procedure: CESAREAN SECTION;  Surgeon: Marlow Baars, MD;  Location: [facility] BIRTHING SUITES;  Service: Obstetrics;  Laterality: N/A;  . LAPAROSCOPIC APPENDECTOMY N/A 05/31/2014   Procedure: APPENDECTOMY LAPAROSCOPIC;  Surgeon: Axel Filler, MD;  [facility]: [facility] OR;  Service: General;  Laterality: N/A;  . TENOLYSIS Left 06/15/2020   Procedure: Left ring and small finger scar excision and tenolysis of Flexor digitorum superficialis/Flexor digitorum profundus repairs;  Surgeon: Bradly Bienenstock, MD;  Location:  SURGERY CENTER;  Service: Orthopedics;  Laterality: Left;  with IV sedation needs 90 minutes    There were no vitals filed for this visit.   Subjective Assessment - 07/04/20 0801    Subjective  Pt denies pain    Pertinent History tenolysis surgery 06/15/20    Currently in Pain? No/denies            Ultrasound x 8 min. Over incision area at base of ring finger, 3 Mhz, 20%  pulsed, 0.8 wts/cm2 to help manage scar tissue and edema.  Reviewed HEP - including A/ROM, AA/ROM, and place and hold ex's. Pt issued additional buddy strap.  Pt given instructions for scar tissue but with modifications to do on sides of scar more distally as wound tends to open up w/ scar massage. Ok to do right over more proximal scar as this is completely healed.  NMES x 10 min, previous parameters, Int = 18                    OT Education - 07/04/20 0831    Education Details scar massage (modified)    Person(s) Educated Patient    Methods Explanation;Demonstration    Comprehension Verbalized understanding            OT Short Term Goals - 07/04/20 0831      OT SHORT TERM GOAL #1   Title Pt will verbalize understanding of hand hygine/ dressing changes    Time 4    Period Weeks    Status Achieved      OT SHORT TERM GOAL #2   Title I with inital HEP    Time 4    Period Weeks    Status Achieved      OT SHORT TERM GOAL #3   Title I with scar massage and edema control prn.    Time 4    Period Weeks  Status On-going             OT Long Term Goals - 06/21/20 1554      OT LONG TERM GOAL #1   Title I with updated HEP.    Time 8    Period Weeks    Status New      OT LONG TERM GOAL #2   Title Pt will resume use of LUE as a non dominant assist at least 90% of the time for ADLs/ IADLs  with pain less than or equal to 2/10.    Time 8    Period Weeks    Status New      OT LONG TERM GOAL #3   Title Pt will demonstrate grip strength of at least 25 lbs for increased functional use during ADLs    Time 8    Period Weeks    Status New      OT LONG TERM GOAL #4   Title Pt will demonstrate at least 80% composite flexion for ring and small finger for increased functional use.    Time 8    Period Weeks    Status New                 Plan - 07/04/20 2202    Clinical Impression Statement Pt with considerable scar tissue which limits mobility Lt  ring finger. Pt doing well w/ ex's    OT Occupational Profile and History Problem Focused Assessment - Including review of records relating to presenting problem    Occupational performance deficits (Please refer to evaluation for details): ADL's;IADL's;Work;Leisure;Social Participation    Body Structure / Function / Physical Skills ADL;UE functional use;Flexibility;FMC;Pain;ROM;Scar mobility;Decreased knowledge of precautions;Decreased knowledge of use of DME;IADL;Dexterity;Strength;Mobility;Skin integrity;Coordination    OT Frequency 2x / week   or 17 visits total   OT Duration 8 weeks    OT Treatment/Interventions Self-care/ADL training;Ultrasound;Scar mobilization;Patient/family education;Passive range of motion;Paraffin;Cryotherapy;Fluidtherapy;Splinting;Contrast Bath;Electrical Stimulation;Moist Heat;Therapeutic exercise;Manual Therapy;Therapeutic activities;Neuromuscular education    Plan continue to perform A/ROM, AA/ROM and place and hold exerices, NMES, [place] and estim    Consulted and Agree with Plan of Care Patient           Patient will benefit from skilled therapeutic intervention in order to improve the following deficits and impairments:   Body Structure / Function / Physical Skills: ADL,UE functional use,Flexibility,FMC,Pain,ROM,Scar mobility,Decreased knowledge of precautions,Decreased knowledge of use of DME,IADL,Dexterity,Strength,Mobility,Skin integrity,Coordination       Visit Diagnosis: Stiffness of left hand, not elsewhere classified  Muscle weakness (generalized)  Localized edema    Problem List Patient Active Problem List   Diagnosis Date Noted  . Decreased fetal movement 01/20/2017  . Perforated appendicitis 06/01/2014  . Acute appendicitis 05/31/2014  . Annual physical exam 02/09/2014  . Pap smear for cervical cancer screening 02/09/2014  . NSVD (normal spontaneous vaginal delivery) 03/29/2012  . Mild preeclampsia delivered 03/29/2012  . GBS (group B  streptococcus) UTI complicating pregnancy 08/21/2011    Kelli Churn, OTR/L 07/04/2020, 8:33 AM  [facility] [address] [place], [place], [address] Phone: [phone]   Fax:  [phone]  Name: Karishma Unrein MRN: [id number] Date of Birth: 1986-03-25

## 2020-07-06 ENCOUNTER — Encounter: Payer: Self-pay | Admitting: Occupational Therapy

## 2020-07-06 ENCOUNTER — Ambulatory Visit: Payer: Medicaid Other | Admitting: Occupational Therapy

## 2020-07-06 ENCOUNTER — Other Ambulatory Visit: Payer: Self-pay

## 2020-07-06 DIAGNOSIS — R600 Localized edema: Secondary | ICD-10-CM

## 2020-07-06 DIAGNOSIS — M25642 Stiffness of left hand, not elsewhere classified: Secondary | ICD-10-CM

## 2020-07-06 DIAGNOSIS — M6281 Muscle weakness (generalized): Secondary | ICD-10-CM

## 2020-07-06 DIAGNOSIS — M79642 Pain in left hand: Secondary | ICD-10-CM

## 2020-07-06 NOTE — Therapy (Signed)
[facility] [address] [place], [place], [address] Phone: [phone]   Fax:  [phone]  Occupational Therapy Treatment  Patient Details  Name: Bridget Huffman MRN: [id number] Date of Birth: 1985-08-27 Referring Provider (OT): Dr. Melvyn Novas   Encounter Date: 07/06/2020   OT End of Session - 07/06/20 0845    Visit Number 5    Number of Visits 17    Date for OT Re-Evaluation 08/16/20    Authorization Type CAFA through 10/05/20    OT Start Time 0805    OT Stop Time 0845    OT Time Calculation (min) 40 min    Activity Tolerance Patient tolerated treatment well    Behavior During Therapy [facility] for tasks assessed/performed           Past Medical History:  Diagnosis Date  . Hx of preeclampsia, prior pregnancy, currently pregnant   . No pertinent past medical history   . Pregnancy induced hypertension    previous pregnancy    Past Surgical History:  Procedure Laterality Date  . APPENDECTOMY    . CESAREAN SECTION N/A 01/21/2017   Procedure: CESAREAN SECTION;  Surgeon: Marlow Baars, MD;  Location: [facility] BIRTHING SUIT[facility];  Service: Obstetrics;  Laterality: N/A;  . LAPAROSCOPIC APPENDECTOMY N/A 05/31/2014   Procedure: APPENDECTOMY LAPAROSCOPIC;  Surgeon: Axel Filler, MD;  [facility];  Service: General;  Laterality: N/A;  . TENOLYSIS Left 06/15/2020   Procedure: Left ring and small finger scar excision and tenolysis of Flexor digitorum superficialis/Flexor digitorum profundus repairs;  Surgeon: Bradly Bienenstock, MD;  Location: [facility];  Service: Orthopedics;  Laterality: Left;  with IV sedation needs 90 minutes    There were no vitals filed for this visit.   Subjective Assessment - 07/06/20 0844    Subjective  Pt denies pain    Pertinent History tenolysis surgery 06/15/20    Currently in Pain? No/denies                Ultrasound x 8 min. Over incision area at base of ring finger, 3  Mhz, 20% pulsed, 0.8 wts/cm2 to help manage scar tissue and edema.  Reviewed HEP - including A/ROM, AA/ROM, and place and hold ex's. Pt used buddy strap. Added additional exercise with pt performing hook fist then finger extension AA/ROM with buddy strap.  NMES x 10 min, previous parameters, Int = 19                    OT Short Term Goals - 07/04/20 0831      OT SHORT TERM GOAL #1   Title Pt will verbalize understanding of hand hygine/ dressing changes    Time 4    Period Weeks    Status Achieved      OT SHORT TERM GOAL #2   Title I with inital HEP    Time 4    Period Weeks    Status Achieved      OT SHORT TERM GOAL #3   Title I with scar massage and edema control prn.    Time 4    Period Weeks    Status On-going             OT Long Term Goals - 06/21/20 1554      OT LONG TERM GOAL #1   Title I with updated HEP.    Time 8    Period Weeks    Status New      OT LONG  TERM GOAL #2   Title Pt will resume use of LUE as a non dominant assist at least 90% of the time for ADLs/ IADLs  with pain less than or equal to 2/10.    Time 8    Period Weeks    Status New      OT LONG TERM GOAL #3   Title Pt will demonstrate grip strength of at least 25 lbs for increased functional use during ADLs    Time 8    Period Weeks    Status New      OT LONG TERM GOAL #4   Title Pt will demonstrate at least 80% composite flexion for ring and small finger for increased functional use.    Time 8    Period Weeks    Status New                 Plan - 07/06/20 0845    Clinical Impression Statement Pt is progressing towards goals with improving ability to actively flex her ring finger.    OT Occupational Profile and History Problem Focused Assessment - Including review of records relating to presenting problem    Occupational performance deficits (Please refer to evaluation for details): ADL's;IADL's;Work;Leisure;Social Participation    Body Structure / Function /  Physical Skills ADL;UE functional use;Flexibility;FMC;Pain;ROM;Scar mobility;Decreased knowledge of precautions;Decreased knowledge of use of DME;IADL;Dexterity;Strength;Mobility;Skin integrity;Coordination    OT Frequency 2x / week   or 17 visits total   OT Duration 8 weeks    OT Treatment/Interventions Self-care/ADL training;Ultrasound;Scar mobilization;Patient/family education;Passive range of motion;Paraffin;Cryotherapy;Fluidtherapy;Splinting;Contrast Bath;Electrical Stimulation;Moist Heat;Therapeutic exercise;Manual Therapy;Therapeutic activities;Neuromuscular education    Plan continue to perform A/ROM, AA/ROM and place and hold exerices, NMES, [place] and estim    Consulted and Agree with Plan of Care Patient           Patient will benefit from skilled therapeutic intervention in order to improve the following deficits and impairments:   Body Structure / Function / Physical Skills: ADL,UE functional use,Flexibility,FMC,Pain,ROM,Scar mobility,Decreased knowledge of precautions,Decreased knowledge of use of DME,IADL,Dexterity,Strength,Mobility,Skin integrity,Coordination       Visit Diagnosis: Stiffness of left hand, not elsewhere classified  Muscle weakness (generalized)  Localized edema  Pain in left hand    Problem List Patient Active Problem List   Diagnosis Date Noted  . Decreased fetal movement 01/20/2017  . Perforated appendicitis 06/01/2014  . Acute appendicitis 05/31/2014  . Annual physical exam 02/09/2014  . Pap smear for cervical cancer screening 02/09/2014  . NSVD (normal spontaneous vaginal delivery) 03/29/2012  . Mild preeclampsia delivered 03/29/2012  . GBS (group B streptococcus) UTI complicating pregnancy 08/21/2011    Bridget Huffman 07/06/2020, 8:49 AM Keene Breath, OTR/L [phone] Phone: [phone] 10:05 AM 07/06/20 [facility] [address] [place], [place], [address] Phone:  [phone]   Fax:  [phone]  Name: Bridget Huffman MRN: [id number] Date of Birth: 11/29/1985

## 2020-07-12 ENCOUNTER — Other Ambulatory Visit: Payer: Self-pay

## 2020-07-12 ENCOUNTER — Ambulatory Visit: Payer: Self-pay | Attending: Orthopedic Surgery | Admitting: Occupational Therapy

## 2020-07-12 DIAGNOSIS — M6281 Muscle weakness (generalized): Secondary | ICD-10-CM | POA: Insufficient documentation

## 2020-07-12 DIAGNOSIS — M79642 Pain in left hand: Secondary | ICD-10-CM | POA: Insufficient documentation

## 2020-07-12 DIAGNOSIS — M25642 Stiffness of left hand, not elsewhere classified: Secondary | ICD-10-CM | POA: Insufficient documentation

## 2020-07-12 DIAGNOSIS — R600 Localized edema: Secondary | ICD-10-CM | POA: Insufficient documentation

## 2020-07-12 NOTE — Therapy (Signed)
[facility] [address] [place], [place], [address] Phone: [phone]   Fax:  [phone]  Occupational Therapy Treatment  Patient Details  Name: Bridget Huffman MRN: [id number] Date of Birth: [birth year] Referring Provider (OT): Dr. Melvyn Novas   Encounter Date: 07/12/2020   OT End of Session - 07/12/20 0800    Visit Number 6    Number of Visits 17    Date for OT Re-Evaluation 08/16/20    Authorization Type CAFA through 10/05/20    OT Start Time 0719    OT Stop Time 0802    OT Time Calculation (min) 43 min           Past Medical History:  Diagnosis Date  . Hx of preeclampsia, prior pregnancy, currently pregnant   . No pertinent past medical history   . Pregnancy induced hypertension    previous pregnancy    Past Surgical History:  Procedure Laterality Date  . APPENDECTOMY    . CESAREAN SECTION N/A 01/21/2017   Procedure: CESAREAN SECTION;  Surgeon: Marlow Baars, MD;  Location: [facility]IRTH[facility] SUIT[facility];  Service: Obstetrics;  Laterality: N/A;  . LAPAROSCOPIC APPENDECTOMY N/A 05/31/2014   Procedure: APPENDECTOMY LAPAROSCOPIC;  Surgeon: Axel Filler, MD;  Location: [facility];  Service: General;  Laterality: N/A;  . TENOLYSIS Left 06/15/2020   Procedure: Left ring and small finger scar excision and tenolysis of Flexor digitorum superficialis/Flexor digitorum profundus repairs;  Surgeon: Bradly Bienenstock, MD;  Location: [facility];  Service: Orthopedics;  Laterality: Left;  with IV sedation needs 90 minutes    There were no vitals filed for this visit.   Subjective Assessment - 07/12/20 0800    Subjective  Pt denies pain    Pertinent History tenolysis surgery 06/15/20    Currently in Pain? No/denies                      Treatment:Ultrasound x 8 min. Over incision area at base of ring finger, 3 Mhz, 20% pulsed, 0.8 wts/cm2 to help manage scar tissue and edema.  Scar massage performed with  lo Reviewed HEP - including A/ROM, AA/ROM, gentle passive finger flexion/ extension, finger flexion with buddy strap to hook fist then finger extension, and A/ROM wrist flexion/ extension. and place and hold ex's. Pt used buddy strap for several exercises.  NMES x 10 min, small muscle group,  previous parameters, Int = 19             OT Short Term Goals - 07/04/20 0831      OT SHORT TERM GOAL #1   Title Pt will verbalize understanding of hand hygine/ dressing changes    Time 4    Period Weeks    Status Achieved      OT SHORT TERM GOAL #2   Title I with inital HEP    Time 4    Period Weeks    Status Achieved      OT SHORT TERM GOAL #3   Title I with scar massage and edema control prn.    Time 4    Period Weeks    Status On-going             OT Long Term Goals - 06/21/20 1554      OT LONG TERM GOAL #1   Title I with updated HEP.    Time 8    Period Weeks    Status New      OT LONG TERM GOAL #  2   Title Pt will resume use of LUE as a non dominant assist at least 90% of the time for ADLs/ IADLs  with pain less than or equal to 2/10.    Time 8    Period Weeks    Status New      OT LONG TERM GOAL #3   Title Pt will demonstrate grip strength of at least 25 lbs for increased functional use during ADLs    Time 8    Period Weeks    Status New      OT LONG TERM GOAL #4   Title Pt will demonstrate at least 80% composite flexion for ring and small finger for increased functional use.    Time 8    Period Weeks    Status New                 Plan - 07/12/20 0800    Clinical Impression Statement Pt is progressing towards goals. Her incision is now closed. She continues to demonstrate limitations in ring finger A/ROM.    OT Occupational Profile and History Problem Focused Assessment - Including review of records relating to presenting problem    Occupational performance deficits (Please refer to evaluation for details): ADL's;IADL's;Work;Leisure;Social  Participation    Body Structure / Function / Physical Skills ADL;UE functional use;Flexibility;FMC;Pain;ROM;Scar mobility;Decreased knowledge of precautions;Decreased knowledge of use of DME;IADL;Dexterity;Strength;Mobility;Skin integrity;Coordination    OT Frequency 2x / week   or 17 visits total   OT Duration 8 weeks    OT Treatment/Interventions Self-care/ADL training;Ultrasound;Scar mobilization;Patient/family education;Passive range of motion;Paraffin;Cryotherapy;Fluidtherapy;Splinting;Contrast Bath;Electrical Stimulation;Moist Heat;Therapeutic exercise;Manual Therapy;Therapeutic activities;Neuromuscular education    Plan continue to perform A/ROM, AA/ROM and place and hold exerices, NMES, [place] and estim    Consulted and Agree with Plan of Care Patient           Patient will benefit from skilled therapeutic intervention in order to improve the following deficits and impairments:   Body Structure / Function / Physical Skills: ADL,UE functional use,Flexibility,FMC,Pain,ROM,Scar mobility,Decreased knowledge of precautions,Decreased knowledge of use of DME,IADL,Dexterity,Strength,Mobility,Skin integrity,Coordination       Visit Diagnosis: Stiffness of left hand, not elsewhere classified  Muscle weakness (generalized)  Localized edema  Pain in left hand    Problem List Patient Active Problem List   Diagnosis Date Noted  . Decreased fetal movement 01/20/2017  . Perforated appendicitis 06/01/2014  . Acute appendicitis 05/31/2014  . Annual physical exam 02/09/2014  . Pap smear for cervical cancer screening 02/09/2014  . NSVD (normal spontaneous vaginal delivery) 03/29/2012  . Mild preeclampsia delivered 03/29/2012  . GBS (group B streptococcus) UTI complicating pregnancy 08/21/2011    RINE,KATHRYN 07/12/2020, 8:02 AM Keene Breath, OTR/L [phone] Phone: [phone] 8:25 AM 07/12/20 [facility] [address]  [address] [place], [place], [address] Phone: [phone]   Fax:  [phone]  Name: Bridget Huffman MRN: [id number] Date of Birth: 10/09/1985

## 2020-07-14 ENCOUNTER — Ambulatory Visit: Payer: Self-pay | Admitting: Occupational Therapy

## 2020-07-15 ENCOUNTER — Other Ambulatory Visit: Payer: Self-pay

## 2020-07-15 ENCOUNTER — Ambulatory Visit: Payer: Self-pay | Admitting: Occupational Therapy

## 2020-07-15 DIAGNOSIS — M79642 Pain in left hand: Secondary | ICD-10-CM

## 2020-07-15 DIAGNOSIS — M25642 Stiffness of left hand, not elsewhere classified: Secondary | ICD-10-CM

## 2020-07-15 DIAGNOSIS — M6281 Muscle weakness (generalized): Secondary | ICD-10-CM

## 2020-07-15 DIAGNOSIS — R600 Localized edema: Secondary | ICD-10-CM

## 2020-07-15 NOTE — Therapy (Signed)
[facility] [address] [place], [place], [address] Phone: [phone]   Fax:  [phone]  Occupational Therapy Treatment  Patient Details  Name: Bridget Huffman MRN: [id number] Date of Birth: 09/07/1985 Referring Provider (OT): Dr. Melvyn Novas   Encounter Date: 07/15/2020   OT End of Session - 07/15/20 1515    Visit Number 7    Number of Visits 17    Date for OT Re-Evaluation 08/16/20    Authorization Type CAFA through 10/05/20    OT Start Time 0720    OT Stop Time 0800    OT Time Calculation (min) 40 min           Past Medical History:  Diagnosis Date  . Hx of preeclampsia, prior pregnancy, currently pregnant   . No pertinent past medical history   . Pregnancy induced hypertension    previous pregnancy    Past Surgical History:  Procedure Laterality Date  . APPENDECTOMY    . CESAREAN SECTION N/A 01/21/2017   Procedure: CESAREAN SECTION;  Surgeon: Marlow Baars, MD;  Location: [facility] BIRTHING SU[facility];  Service: Obstetrics;  Laterality: N/A;  . LAPAROSCOPIC APPENDECTOMY N/A 05/31/2014   Procedure: APPENDECTOMY LAPAROSCOPIC;  Surgeon: Axel Filler, MD;  [facility]: [facility];  Service: General;  Laterality: N/A;  . TENOLYSIS Left 06/15/2020   Procedure: Left ring and small finger scar excision and tenolysis of Flexor digitorum superficialis/Flexor digitorum profundus repairs;  Surgeon: Bradly Bienenstock, MD;  Location: [facility];  Service: Orthopedics;  Laterality: Left;  with IV sedation needs 90 minutes    There were no vitals filed for this visit.            Treatment: Ultrasound x 8 min. Over incision area at base of ring finger, 3 Mhz, 50% pulsed, 0.8 wts/cm2 to help manage scar tissue and edema.  Scar massage performed with lotion and extractor Reviewed HEP - including A/ROM, AA/ROM, gentle passive finger flexion/ extension, finger flexion with buddy strap to hook fist then finger  extension, and A/ROM wrist flexion/ extension. and place and hold ex's.  Pt demonstrates improved ability to initiate composite finger flexion with left ring finger. NMES x 8 min, small muscle group,  previous parameters, Int = 19, no adverse reactions                OT Short Term Goals - 07/15/20 1516      OT SHORT TERM GOAL #1   Title Pt will verbalize understanding of hand hygine/ dressing changes    Time 4    Period Weeks    Status Achieved      OT SHORT TERM GOAL #2   Title I with inital HEP    Time 4    Period Weeks    Status Achieved      OT SHORT TERM GOAL #3   Title I with scar massage and edema control prn.    Time 4    Period Weeks    Status Achieved             OT Long Term Goals - 06/21/20 1554      OT LONG TERM GOAL #1   Title I with updated HEP.    Time 8    Period Weeks    Status New      OT LONG TERM GOAL #2   Title Pt will resume use of LUE as a non dominant assist at least 90% of the time for ADLs/ IADLs  with pain less than or equal to 2/10.    Time 8    Period Weeks    Status New      OT LONG TERM GOAL #3   Title Pt will demonstrate grip strength of at least 25 lbs for increased functional use during ADLs    Time 8    Period Weeks    Status New      OT LONG TERM GOAL #4   Title Pt will demonstrate at least 80% composite flexion for ring and small finger for increased functional use.    Time 8    Period Weeks    Status New                 Plan - 07/15/20 1516    Clinical Impression Statement Pt is progressing towards goals.  Pt demonstrates improving A/ROM finger flexion for ring finger.    OT Occupational Profile and History Problem Focused Assessment - Including review of records relating to presenting problem    Occupational performance deficits (Please refer to evaluation for details): ADL's;IADL's;Work;Leisure;Social Participation    Body Structure / Function / Physical Skills ADL;UE functional  use;Flexibility;FMC;Pain;ROM;Scar mobility;Decreased knowledge of precautions;Decreased knowledge of use of DME;IADL;Dexterity;Strength;Mobility;Skin integrity;Coordination    OT Frequency 2x / week   or 17 visits total   OT Duration 8 weeks    OT Treatment/Interventions Self-care/ADL training;Ultrasound;Scar mobilization;Patient/family education;Passive range of motion;Paraffin;Cryotherapy;Fluidtherapy;Splinting;Contrast Bath;Electrical Stimulation;Moist Heat;Therapeutic exercise;Manual Therapy;Therapeutic activities;Neuromuscular education    Plan continue to perform A/ROM, AA/ROM and place and hold exerices, NMES, [place] and estim    Consulted and Agree with Plan of Care Patient           Patient will benefit from skilled therapeutic intervention in order to improve the following deficits and impairments:   Body Structure / Function / Physical Skills: ADL,UE functional use,Flexibility,FMC,Pain,ROM,Scar mobility,Decreased knowledge of precautions,Decreased knowledge of use of DME,IADL,Dexterity,Strength,Mobility,Skin integrity,Coordination       Visit Diagnosis: Stiffness of left hand, not elsewhere classified  Muscle weakness (generalized)  Localized edema  Pain in left hand    Problem List Patient Active Problem List   Diagnosis Date Noted  . Decreased fetal movement 01/20/2017  . Perforated appendicitis 06/01/2014  . Acute appendicitis 05/31/2014  . Annual physical exam 02/09/2014  . Pap smear for cervical cancer screening 02/09/2014  . NSVD (normal spontaneous vaginal delivery) 03/29/2012  . Mild preeclampsia delivered 03/29/2012  . GBS (group B streptococcus) UTI complicating pregnancy 08/21/2011    Elveta Rape 07/15/2020, 3:17 PM  [facility] [address] [place], [place], [address] Phone: [phone]   Fax:  [phone]  Name: Simona Rocque MRN: [id number] Date of Birth: 1986-05-24

## 2020-07-19 ENCOUNTER — Ambulatory Visit: Payer: Self-pay | Admitting: Occupational Therapy

## 2020-07-19 ENCOUNTER — Other Ambulatory Visit: Payer: Self-pay

## 2020-07-19 DIAGNOSIS — R600 Localized edema: Secondary | ICD-10-CM

## 2020-07-19 DIAGNOSIS — M79642 Pain in left hand: Secondary | ICD-10-CM

## 2020-07-19 DIAGNOSIS — M6281 Muscle weakness (generalized): Secondary | ICD-10-CM

## 2020-07-19 DIAGNOSIS — M25642 Stiffness of left hand, not elsewhere classified: Secondary | ICD-10-CM

## 2020-07-19 NOTE — Therapy (Signed)
[facility] [address] [place], [place], [address] Phone: [phone]   Fax:  [phone]  Occupational Therapy Treatment  Patient Details  Name: Bridget Huffman MRN: [id number] Date of Birth: Oct 24, 1985 Referring Provider (OT): Dr. Melvyn Novas   Encounter Date: 07/19/2020   OT End of Session - 07/19/20 1128    Visit Number 8    Number of Visits 17    Date for OT Re-Evaluation 08/16/20    Authorization Type CAFA through 10/05/20    OT Start Time 0805    OT Stop Time 0845    OT Time Calculation (min) 40 min           Past Medical History:  Diagnosis Date  . Hx of preeclampsia, prior pregnancy, currently pregnant   . No pertinent past medical history   . Pregnancy induced hypertension    previous pregnancy    Past Surgical History:  Procedure Laterality Date  . APPENDECTOMY    . CESAREAN SECTION N/A 01/21/2017   Procedure: CESAREAN SECTION;  Surgeon: Marlow Baars, MD;  Location: [facility];  Service: Obstetrics;  Laterality: N/A;  . LAPAROSCOPIC APPENDECTOMY N/A 05/31/2014   Procedure: APPENDECTOMY LAPAROSCOPIC;  Surgeon: Axel Filler, MD;  Location: [facility];  Service: General;  Laterality: N/A;  . TENOLYSIS Left 06/15/2020   Procedure: Left ring and small finger scar excision and tenolysis of Flexor digitorum superficialis/Flexor digitorum profundus repairs;  Surgeon: Bradly Bienenstock, MD;  Location: [facility];  Service: Orthopedics;  Laterality: Left;  with IV sedation needs 90 minutes    There were no vitals filed for this visit.   Subjective Assessment - 07/19/20 1127    Subjective  Pt denies pain, reports  her hand is doing better    Pertinent History tenolysis surgery 06/15/20    Currently in Pain? No/denies                   Treatment: Ultrasound x 8 min. Over incision area at base of ring finger, 3 Mhz, 50% pulsed, 0.8 wts/cm2 to help manage scar tissue and edema.  Scar massage performed with lotion and extractor  Reviewed HEP - including A/ROM, AA/ROM, gentle passive finger flexion/ extension, finger flexion with buddy strap to hook fist then finger extension, and A/ROM wrist flexion/ extension. and place and hold ex's. Pt performed transition from composite grasp to hook fist with foam for pen rolling exercise  Pt demonstrates continued  improved ability to initiate composite finger flexion with left ring finger. NMES x 10 min, small muscle group, to finger flexors with pt performing finger flexion during on cycle previous parameters, Int = 18, no adverse reactions                 OT Short Term Goals - 07/15/20 1516      OT SHORT TERM GOAL #1   Title Pt will verbalize understanding of hand hygine/ dressing changes    Time 4    Period Weeks    Status Achieved      OT SHORT TERM GOAL #2   Title I with inital HEP    Time 4    Period Weeks    Status Achieved      OT SHORT TERM GOAL #3   Title I with scar massage and edema control prn.    Time 4    Period Weeks    Status Achieved             OT  Long Term Goals - 06/21/20 1554      OT LONG TERM GOAL #1   Title I with updated HEP.    Time 8    Period Weeks    Status New      OT LONG TERM GOAL #2   Title Pt will resume use of LUE as a non dominant assist at least 90% of the time for ADLs/ IADLs  with pain less than or equal to 2/10.    Time 8    Period Weeks    Status New      OT LONG TERM GOAL #3   Title Pt will demonstrate grip strength of at least 25 lbs for increased functional use during ADLs    Time 8    Period Weeks    Status New      OT LONG TERM GOAL #4   Title Pt will demonstrate at least 80% composite flexion for ring and small finger for increased functional use.    Time 8    Period Weeks    Status New                 Plan - 07/19/20 1128    Clinical Impression Statement Pt is progressing towards goals.  Pt continues to demonstrate  improving A/ROM finger flexion for ring finger.    OT Occupational Profile and History Problem Focused Assessment - Including review of records relating to presenting problem    Occupational performance deficits (Please refer to evaluation for details): ADL's;IADL's;Work;Leisure;Social Participation    Body Structure / Function / Physical Skills ADL;UE functional use;Flexibility;FMC;Pain;ROM;Scar mobility;Decreased knowledge of precautions;Decreased knowledge of use of DME;IADL;Dexterity;Strength;Mobility;Skin integrity;Coordination    OT Frequency 2x / week   or 17 visits total   OT Duration 8 weeks    OT Treatment/Interventions Self-care/ADL training;Ultrasound;Scar mobilization;Patient/family education;Passive range of motion;Paraffin;Cryotherapy;Fluidtherapy;Splinting;Contrast Bath;Electrical Stimulation;Moist Heat;Therapeutic exercise;Manual Therapy;Therapeutic activities;Neuromuscular education    Plan continue to perform A/ROM, AA/ROM and place and hold exercises , NMES, [place] and estim    Consulted and Agree with Plan of Care Patient           Patient will benefit from skilled therapeutic intervention in order to improve the following deficits and impairments:   Body Structure / Function / Physical Skills: ADL,UE functional use,Flexibility,FMC,Pain,ROM,Scar mobility,Decreased knowledge of precautions,Decreased knowledge of use of DME,IADL,Dexterity,Strength,Mobility,Skin integrity,Coordination       Visit Diagnosis: Stiffness of left hand, not elsewhere classified  Muscle weakness (generalized)  Localized edema  Pain in left hand    Problem List Patient Active Problem List   Diagnosis Date Noted  . Decreased fetal movement 01/20/2017  . Perforated appendicitis 06/01/2014  . Acute appendicitis 05/31/2014  . Annual physical exam 02/09/2014  . Pap smear for cervical cancer screening 02/09/2014  . NSVD (normal spontaneous vaginal delivery) 03/29/2012  . Mild preeclampsia  delivered 03/29/2012  . GBS (group B streptococcus) UTI complicating pregnancy 08/21/2011    Daisha Filosa 07/19/2020, 11:31 AM Keene Breath, OTR/L [phone] Phone: [phone] 11:33 AM 07/19/20 [facility] [address] [place], [place], [address] Phone: [phone]   Fax:  [phone]  Name: Bridget Huffman MRN: [id number] Date of Birth: [birth year]

## 2020-07-22 ENCOUNTER — Ambulatory Visit: Payer: Self-pay | Admitting: Occupational Therapy

## 2020-07-22 ENCOUNTER — Other Ambulatory Visit: Payer: Self-pay

## 2020-07-22 DIAGNOSIS — M79642 Pain in left hand: Secondary | ICD-10-CM

## 2020-07-22 DIAGNOSIS — M6281 Muscle weakness (generalized): Secondary | ICD-10-CM

## 2020-07-22 DIAGNOSIS — R600 Localized edema: Secondary | ICD-10-CM

## 2020-07-22 DIAGNOSIS — M25642 Stiffness of left hand, not elsewhere classified: Secondary | ICD-10-CM

## 2020-07-22 NOTE — Therapy (Signed)
[facility] [address] [place], [place], [address] Phone: [phone]   Fax:  [phone]  Occupational Therapy Treatment  Patient Details  Name: Bridget Huffman MRN: [id number] Date of Birth: [birth year] Referring Provider (OT): Dr. Melvyn Novas   Encounter Date: 07/22/2020   OT End of Session - 07/22/20 1426    Visit Number 9    Number of Visits 17    Date for OT Re-Evaluation 08/16/20    Authorization Type CAFA through 10/05/20    OT Start Time 0720    OT Stop Time 0800    OT Time Calculation (min) 40 min           Past Medical History:  Diagnosis Date  . Hx of preeclampsia, prior pregnancy, currently pregnant   . No pertinent past medical history   . Pregnancy induced hypertension    previous pregnancy    Past Surgical History:  Procedure Laterality Date  . APPENDECTOMY    . CESAREAN SECTION N/A 01/21/2017   Procedure: CESAREAN SECTION;  Surgeon: Marlow Baars, MD;  Location: [facility];  Service: Obstetrics;  Laterality: N/A;  . LAPAROSCOPIC APPENDECTOMY N/A 05/31/2014   Procedure: APPENDECTOMY LAPAROSCOPIC;  Surgeon: Axel Filler, MD;  Location: [facility];  Service: General;  Laterality: N/A;  . TENOLYSIS Left 06/15/2020   Procedure: Left ring and small finger scar excision and tenolysis of Flexor digitorum superficialis/Flexor digitorum profundus repairs;  Surgeon: Bradly Bienenstock, MD;  Location: [facility];  Service: Orthopedics;  Laterality: Left;  with IV sedation needs 90 minutes    There were no vitals filed for this visit.   Subjective Assessment - 07/22/20 1427    Subjective  Pt denies pain, reports  her hand is doing better    Pertinent History tenolysis surgery 06/15/20    Currently in Pain? No/denies                       Treatment: Ultrasound x 8 min. Over incision area at base of ring finger, 3 Mhz, 50% pulsed, 0.8 wts/cm2 to help manage scar tissue and  edema. Scar massage performed with lotion and extractor  A/ROM, AA/ROM, gentle passive finger flexion/ extension, finger flexion with buddy strap to hook fist then finger extension, and A/ROM wrist flexion/ extension. and place and hold ex's. Pt performed transition from composite grasp to hook fist with foam for pen rolling exercise  Pt demonstrates continued  improved ability to initiate composite finger flexion with left ring finger. NMES x 10 min, small muscle group, to finger flexors with pt performing finger flexion during on cycle previous parameters, Int = 19 no adverse reactions  Pt demonstrates improved composite flexion          OT Short Term Goals - 07/15/20 1516      OT SHORT TERM GOAL #1   Title Pt will verbalize understanding of hand hygine/ dressing changes    Time 4    Period Weeks    Status Achieved      OT SHORT TERM GOAL #2   Title I with inital HEP    Time 4    Period Weeks    Status Achieved      OT SHORT TERM GOAL #3   Title I with scar massage and edema control prn.    Time 4    Period Weeks    Status Achieved             OT Long  Term Goals - 06/21/20 1554      OT LONG TERM GOAL #1   Title I with updated HEP.    Time 8    Period Weeks    Status New      OT LONG TERM GOAL #2   Title Pt will resume use of LUE as a non dominant assist at least 90% of the time for ADLs/ IADLs  with pain less than or equal to 2/10.    Time 8    Period Weeks    Status New      OT LONG TERM GOAL #3   Title Pt will demonstrate grip strength of at least 25 lbs for increased functional use during ADLs    Time 8    Period Weeks    Status New      OT LONG TERM GOAL #4   Title Pt will demonstrate at least 80% composite flexion for ring and small finger for increased functional use.    Time 8    Period Weeks    Status New                 Plan - 07/22/20 1426    Clinical Impression Statement Pt is progressing towards goals.  Pt continues to  demonstrate improving A/ROM finger flexion for ring finger.    OT Occupational Profile and History Problem Focused Assessment - Including review of records relating to presenting problem    Occupational performance deficits (Please refer to evaluation for details): ADL's;IADL's;Work;Leisure;Social Participation    Body Structure / Function / Physical Skills ADL;UE functional use;Flexibility;FMC;Pain;ROM;Scar mobility;Decreased knowledge of precautions;Decreased knowledge of use of DME;IADL;Dexterity;Strength;Mobility;Skin integrity;Coordination    OT Frequency 2x / week   or 17 visits total   OT Duration 8 weeks    OT Treatment/Interventions Self-care/ADL training;Ultrasound;Scar mobilization;Patient/family education;Passive range of motion;Paraffin;Cryotherapy;Fluidtherapy;Splinting;Contrast Bath;Electrical Stimulation;Moist Heat;Therapeutic exercise;Manual Therapy;Therapeutic activities;Neuromuscular education    Plan continue to perform A/ROM, AA/ROM and place and hold exercises , NMES, [place] and estim    Consulted and Agree with Plan of Care Patient           Patient will benefit from skilled therapeutic intervention in order to improve the following deficits and impairments:   Body Structure / Function / Physical Skills: ADL,UE functional use,Flexibility,FMC,Pain,ROM,Scar mobility,Decreased knowledge of precautions,Decreased knowledge of use of DME,IADL,Dexterity,Strength,Mobility,Skin integrity,Coordination       Visit Diagnosis: Stiffness of left hand, not elsewhere classified  Muscle weakness (generalized)  Localized edema  Pain in left hand    Problem List Patient Active Problem List   Diagnosis Date Noted  . Decreased fetal movement 01/20/2017  . Perforated appendicitis 06/01/2014  . Acute appendicitis 05/31/2014  . Annual physical exam 02/09/2014  . Pap smear for cervical cancer screening 02/09/2014  . NSVD (normal spontaneous vaginal delivery) 03/29/2012  . Mild  preeclampsia delivered 03/29/2012  . GBS (group B streptococcus) UTI complicating pregnancy 08/21/2011    Rook Maue 07/22/2020, 2:27 PM  [facility] [address] [place], [place], [address] Phone: [phone]   Fax:  [phone]  Name: Bridget Huffman MRN: [id number] Date of Birth: [birth year]

## 2020-07-26 ENCOUNTER — Ambulatory Visit: Payer: Self-pay | Admitting: Occupational Therapy

## 2020-07-26 ENCOUNTER — Encounter: Payer: Self-pay | Admitting: Occupational Therapy

## 2020-07-26 ENCOUNTER — Other Ambulatory Visit: Payer: Self-pay

## 2020-07-26 ENCOUNTER — Encounter: Payer: Medicaid Other | Admitting: Occupational Therapy

## 2020-07-26 DIAGNOSIS — M6281 Muscle weakness (generalized): Secondary | ICD-10-CM

## 2020-07-26 DIAGNOSIS — R600 Localized edema: Secondary | ICD-10-CM

## 2020-07-26 DIAGNOSIS — M25642 Stiffness of left hand, not elsewhere classified: Secondary | ICD-10-CM

## 2020-07-26 DIAGNOSIS — M79642 Pain in left hand: Secondary | ICD-10-CM

## 2020-07-26 NOTE — Patient Instructions (Signed)
  AROM: PIP Flexion / Extension   Pinch bottom knuckle of ___ring_____ finger of hand to prevent bending. Actively bend middle knuckle until stretch is felt. Hold __5__ seconds. Relax. Straighten finger as far as possible. Repeat __10-15__ times per set. Do  3__ sessions per day.

## 2020-07-26 NOTE — Therapy (Signed)
[facility] [address] [place], [place], [address] Phone: [phone]   Fax:  [phone]  Occupational Therapy Treatment  Patient Details  Name: Bridget Huffman MRN: [id number] Date of Birth: Oct 21, 1985 Referring Provider (OT): Dr. Melvyn Novas   Encounter Date: 07/26/2020   OT End of Session - 07/26/20 0835    Visit Number 10    Number of Visits 17    Date for OT Re-Evaluation 08/16/20    Authorization Type CAFA through 10/05/20    OT Start Time 0803    OT Stop Time 0840    OT Time Calculation (min) 37 min           Past Medical History:  Diagnosis Date  . Hx of preeclampsia, prior pregnancy, currently pregnant   . No pertinent past medical history   . Pregnancy induced hypertension    previous pregnancy    Past Surgical History:  Procedure Laterality Date  . APPENDECTOMY    . CESAREAN SECTION N/A 01/21/2017   Procedure: CESAREAN SECTION;  Surgeon: Marlow Baars, MD;  Location: [facility];  Service: Obstetrics;  Laterality: N/A;  . LAPAROSCOPIC APPENDECTOMY N/A 05/31/2014   Procedure: APPENDECTOMY LAPAROSCOPIC;  Surgeon: Axel Filler, MD;  [facility]: [facility];  Service: General;  Laterality: N/A;  . TENOLYSIS Left 06/15/2020   Procedure: Left ring and small finger scar excision and tenolysis of Flexor digitorum superficialis/Flexor digitorum profundus repairs;  Surgeon: Bradly Bienenstock, MD;  Location: [facility];  Service: Orthopedics;  Laterality: Left;  with IV sedation needs 90 minutes    There were no vitals filed for this visit.   Subjective Assessment - 07/26/20 0839    Subjective  Denies pain    Pertinent History tenolysis surgery 06/15/20    Currently in Pain? No/denies                   Treatment: Ultrasound x 8 min. Over incision area at base of ring finger and ring finger ,3 Mhz, 50% pulsed, 0.8 wts/cm2 to help manage scar tissue and edema.  Scar mobilization  with extractor to palm and ring finger   A/ROM, AA/ROM, gentle passive finger flexion/ extension, finger flexion with buddy strap to hook fist then finger extension, and A/ROM wrist flexion/ extension. and place and hold ex's. Pt was instructed  in PIP blocking as she is now 5 weeks postop, buddy strap was used -mod difficulty/ v.c Pt performed transition from composite grasp to hook fist with foam for pen rolling exercise  Pt demonstrates continued  improved ability to initiate composite finger flexion with left ring finger. NMES x 8 min, small muscle group, to finger flexors with pt performing finger flexion during on cycle previous parameters, Int = 19 no adverse reactions                OT Short Term Goals - 07/15/20 1516      OT SHORT TERM GOAL #1   Title Pt will verbalize understanding of hand hygine/ dressing changes    Time 4    Period Weeks    Status Achieved      OT SHORT TERM GOAL #2   Title I with inital HEP    Time 4    Period Weeks    Status Achieved      OT SHORT TERM GOAL #3   Title I with scar massage and edema control prn.    Time 4    Period Weeks  Status Achieved             OT Long Term Goals - 06/21/20 1554      OT LONG TERM GOAL #1   Title I with updated HEP.    Time 8    Period Weeks    Status New      OT LONG TERM GOAL #2   Title Pt will resume use of LUE as a non dominant assist at least 90% of the time for ADLs/ IADLs  with pain less than or equal to 2/10.    Time 8    Period Weeks    Status New      OT LONG TERM GOAL #3   Title Pt will demonstrate grip strength of at least 25 lbs for increased functional use during ADLs    Time 8    Period Weeks    Status New      OT LONG TERM GOAL #4   Title Pt will demonstrate at least 80% composite flexion for ring and small finger for increased functional use.    Time 8    Period Weeks    Status New                 Plan - 07/26/20 5009    Clinical Impression Statement  Pt is progressing towards goals.  Pt continues to demonstrate improving A/ROM composite finger. Pt continues to demonstrate limited PIP flexion/ extension    OT Occupational Profile and History Problem Focused Assessment - Including review of records relating to presenting problem    Occupational performance deficits (Please refer to evaluation for details): ADL's;IADL's;Work;Leisure;Social Participation    Body Structure / Function / Physical Skills ADL;UE functional use;Flexibility;FMC;Pain;ROM;Scar mobility;Decreased knowledge of precautions;Decreased knowledge of use of DME;IADL;Dexterity;Strength;Mobility;Skin integrity;Coordination    Rehab Potential Good    Clinical Decision Making Limited treatment options, no task modification necessary    OT Frequency 2x / week    OT Duration 8 weeks    OT Treatment/Interventions Self-care/ADL training;Ultrasound;Scar mobilization;Patient/family education;Passive range of motion;Paraffin;Cryotherapy;Fluidtherapy;Splinting;Contrast Bath;Electrical Stimulation;Moist Heat;Therapeutic exercise;Manual Therapy;Therapeutic activities;Neuromuscular education    Plan continue to perform A/ROM, AA/ROM and place and hold exercises , NMES, [place] and estim    Consulted and Agree with Plan of Care Patient           Patient will benefit from skilled therapeutic intervention in order to improve the following deficits and impairments:   Body Structure / Function / Physical Skills: ADL,UE functional use,Flexibility,FMC,Pain,ROM,Scar mobility,Decreased knowledge of precautions,Decreased knowledge of use of DME,IADL,Dexterity,Strength,Mobility,Skin integrity,Coordination       Visit Diagnosis: Stiffness of left hand, not elsewhere classified  Muscle weakness (generalized)  Localized edema  Pain in left hand    Problem List Patient Active Problem List   Diagnosis Date Noted  . Decreased fetal movement 01/20/2017  . Perforated appendicitis 06/01/2014  . Acute  appendicitis 05/31/2014  . Annual physical exam 02/09/2014  . Pap smear for cervical cancer screening 02/09/2014  . NSVD (normal spontaneous vaginal delivery) 03/29/2012  . Mild preeclampsia delivered 03/29/2012  . GBS (group B streptococcus) UTI complicating pregnancy 08/21/2011    Bridget Huffman 07/26/2020, 3:37 PM   [facility] [address] [place], [place], [address] Phone: [phone]   Fax:  [phone]  Name: Bridget Huffman MRN: [id number] Date of Birth: [birth year]

## 2020-07-28 ENCOUNTER — Encounter: Payer: Medicaid Other | Admitting: Occupational Therapy

## 2020-07-29 ENCOUNTER — Ambulatory Visit: Payer: Self-pay | Admitting: Occupational Therapy

## 2020-07-29 ENCOUNTER — Encounter: Payer: Medicaid Other | Admitting: Occupational Therapy

## 2020-07-29 ENCOUNTER — Encounter: Payer: Self-pay | Admitting: Occupational Therapy

## 2020-07-29 ENCOUNTER — Other Ambulatory Visit: Payer: Self-pay

## 2020-07-29 DIAGNOSIS — M79642 Pain in left hand: Secondary | ICD-10-CM

## 2020-07-29 DIAGNOSIS — M25642 Stiffness of left hand, not elsewhere classified: Secondary | ICD-10-CM

## 2020-07-29 DIAGNOSIS — M6281 Muscle weakness (generalized): Secondary | ICD-10-CM

## 2020-07-29 DIAGNOSIS — R600 Localized edema: Secondary | ICD-10-CM

## 2020-07-29 NOTE — Therapy (Signed)
[facility] [address] [place], [place], [address] Phone: [phone]   Fax:  [phone]  Occupational Therapy Treatment  Patient Details  Name: Bridget Huffman MRN: [id number] Date of Birth: January 01, 1986 Referring Provider (OT): Dr. Melvyn Novas   Encounter Date: 07/29/2020   OT End of Session - 07/29/20 0741    Visit Number 11    Number of Visits 17    Date for OT Re-Evaluation 08/16/20    Authorization Type CAFA through 10/05/20    OT Start Time 0720    OT Stop Time 0800    OT Time Calculation (min) 40 min           Past Medical History:  Diagnosis Date  . Hx of preeclampsia, prior pregnancy, currently pregnant   . No pertinent past medical history   . Pregnancy induced hypertension    previous pregnancy    Past Surgical History:  Procedure Laterality Date  . APPENDECTOMY    . CESAREAN SECTION N/A 01/21/2017   Procedure: CESAREAN SECTION;  Surgeon: Marlow Baars, MD;  Location: [facility]TH[facility];  Service: Obstetrics;  Laterality: N/A;  . LAPAROSCOPIC APPENDECTOMY N/A 05/31/2014   Procedure: APPENDECTOMY LAPAROSCOPIC;  Surgeon: Axel Filler, MD;  Location: [facility];  Service: General;  Laterality: N/A;  . TENOLYSIS Left 06/15/2020   Procedure: Left ring and small finger scar excision and tenolysis of Flexor digitorum superficialis/Flexor digitorum profundus repairs;  Surgeon: Bradly Bienenstock, MD;  Location: [facility];  Service: Orthopedics;  Laterality: Left;  with IV sedation needs 90 minutes    There were no vitals filed for this visit.   Subjective Assessment - 07/29/20 0740    Subjective  No pain    Pertinent History tenolysis surgery 06/15/20    Currently in Pain? No/denies                  Treatment: Ultrasound x 8 min. over incision area at base of ring finger and ring finger  ,3 Mhz, 50% pulsed, 0.8 wts/cm2 to help manage scar tissue and edema.  Scar mobilization with  extractor followed by massage to palm and ring finger   A/ROM, AA/ROM, gentle passive finger flexion/ extension, finger flexion with buddy strap to hook fist then finger extension, and A/ROM wrist flexion/ extension. and place and hold ex's. Pt perfromed PIP blocking  For ring finger with buddy Pt performed transition from composite grasp to hook fist with foam for pen rolling exercise   NMES x 10 min, small muscle group, to finger flexors with pt performing finger flexion during on cycle previous parameters, Int = 19 no adverse reactions                OT Short Term Goals - 07/15/20 1516      OT SHORT TERM GOAL #1   Title Pt will verbalize understanding of hand hygine/ dressing changes    Time 4    Period Weeks    Status Achieved      OT SHORT TERM GOAL #2   Title I with inital HEP    Time 4    Period Weeks    Status Achieved      OT SHORT TERM GOAL #3   Title I with scar massage and edema control prn.    Time 4    Period Weeks    Status Achieved             OT Long Term Goals - 06/21/20 1554  OT LONG TERM GOAL #1   Title I with updated HEP.    Time 8    Period Weeks    Status New      OT LONG TERM GOAL #2   Title Pt will resume use of LUE as a non dominant assist at least 90% of the time for ADLs/ IADLs  with pain less than or equal to 2/10.    Time 8    Period Weeks    Status New      OT LONG TERM GOAL #3   Title Pt will demonstrate grip strength of at least 25 lbs for increased functional use during ADLs    Time 8    Period Weeks    Status New      OT LONG TERM GOAL #4   Title Pt will demonstrate at least 80% composite flexion for ring and small finger for increased functional use.    Time 8    Period Weeks    Status New                 Plan - 07/29/20 0741    Clinical Impression Statement Pt is progressing towards goals.  Pt continues to demonstrate improving A/ROM composite finger flexion. Pt sees MD next week    OT  Occupational Profile and History Problem Focused Assessment - Including review of records relating to presenting problem    Occupational performance deficits (Please refer to evaluation for details): ADL's;IADL's;Work;Leisure;Social Participation    Body Structure / Function / Physical Skills ADL;UE functional use;Flexibility;FMC;Pain;ROM;Scar mobility;Decreased knowledge of precautions;Decreased knowledge of use of DME;IADL;Dexterity;Strength;Mobility;Skin integrity;Coordination    Rehab Potential Good    Clinical Decision Making Limited treatment options, no task modification necessary    OT Frequency 2x / week    OT Duration 8 weeks    OT Treatment/Interventions Self-care/ADL training;Ultrasound;Scar mobilization;Patient/family education;Passive range of motion;Paraffin;Cryotherapy;Fluidtherapy;Splinting;Contrast Bath;Electrical Stimulation;Moist Heat;Therapeutic exercise;Manual Therapy;Therapeutic activities;Neuromuscular education    Plan Pt sees MD next Friday send note regarding strengthening    Consulted and Agree with Plan of Care Patient           Patient will benefit from skilled therapeutic intervention in order to improve the following deficits and impairments:   Body Structure / Function / Physical Skills: ADL,UE functional use,Flexibility,FMC,Pain,ROM,Scar mobility,Decreased knowledge of precautions,Decreased knowledge of use of DME,IADL,Dexterity,Strength,Mobility,Skin integrity,Coordination       Visit Diagnosis: Stiffness of left hand, not elsewhere classified  Muscle weakness (generalized)  Localized edema  Pain in left hand    Problem List Patient Active Problem List   Diagnosis Date Noted  . Decreased fetal movement 01/20/2017  . Perforated appendicitis 06/01/2014  . Acute appendicitis 05/31/2014  . Annual physical exam 02/09/2014  . Pap smear for cervical cancer screening 02/09/2014  . NSVD (normal spontaneous vaginal delivery) 03/29/2012  . Mild  preeclampsia delivered 03/29/2012  . GBS (group B streptococcus) UTI complicating pregnancy 08/21/2011    Bridget Huffman 07/29/2020, 7:54 AM Keene Breath, OTR/L [phone] Phone: [phone] 7:58 AM 07/29/20 [facility] [address] [place], [place], [address] Phone: [phone]   Fax:  [phone]  Name: Laneisha Mino MRN: [id number] Date of Birth: 01-Aug-1985

## 2020-08-03 ENCOUNTER — Ambulatory Visit: Payer: Self-pay | Admitting: Occupational Therapy

## 2020-08-03 ENCOUNTER — Other Ambulatory Visit: Payer: Self-pay

## 2020-08-03 DIAGNOSIS — M79642 Pain in left hand: Secondary | ICD-10-CM

## 2020-08-03 DIAGNOSIS — M6281 Muscle weakness (generalized): Secondary | ICD-10-CM

## 2020-08-03 DIAGNOSIS — R600 Localized edema: Secondary | ICD-10-CM

## 2020-08-03 DIAGNOSIS — M25642 Stiffness of left hand, not elsewhere classified: Secondary | ICD-10-CM

## 2020-08-03 NOTE — Patient Instructions (Signed)
Dr. Melvyn Novas,    Bridget Huffman has been receiving OT at our site. She demonstrates improved overall composite flexion however she continues to demonstrate an extensor lag at PIP joint. A/ROM L ring finger: MP flexion: 85,  PIP flexion/ extension 85/ -50,  DIP flexion/ extension:45/-20  At what point can she begin normal use of her LUE? When can we strengthen with light putty?    Thanks,   Samara Deist Jaksen Fiorella,OTR/L

## 2020-08-03 NOTE — Therapy (Signed)
[facility] [address] [place], [place], [address] Phone: [phone]   Fax:  [phone]  Occupational Therapy Treatment  Patient Details  Name: Bridget Huffman MRN: [id number] Date of Birth: 05-Nov-1985 Referring Provider (OT): Dr. Melvyn Novas   Encounter Date: 08/03/2020   OT End of Session - 08/03/20 0941    Visit Number 12    Number of Visits 17    Date for OT Re-Evaluation 08/16/20    Authorization Type CAFA through 10/05/20    OT Start Time 0721    OT Stop Time 0804    OT Time Calculation (min) 43 min    Activity Tolerance Patient tolerated treatment well           Past Medical History:  Diagnosis Date  . Hx of preeclampsia, prior pregnancy, currently pregnant   . No pertinent past medical history   . Pregnancy induced hypertension    previous pregnancy    Past Surgical History:  Procedure Laterality Date  . APPENDECTOMY    . CESAREAN SECTION N/A 01/21/2017   Procedure: CESAREAN SECTION;  Surgeon: Marlow Baars, MD;  Location: [facility] SU[facility];  Service: Obstetrics;  Laterality: N/A;  . LAPAROSCOPIC APPENDECTOMY N/A 05/31/2014   Procedure: APPENDECTOMY LAPAROSCOPIC;  Surgeon: Axel Filler, MD;  Location: [facility];  Service: General;  Laterality: N/A;  . TENOLYSIS Left 06/15/2020   Procedure: Left ring and small finger scar excision and tenolysis of Flexor digitorum superficialis/Flexor digitorum profundus repairs;  Surgeon: Bradly Bienenstock, MD;  Location: [facility];  Service: Orthopedics;  Laterality: Left;  with IV sedation needs 90 minutes    There were no vitals filed for this visit.   Subjective Assessment - 08/03/20 0950    Subjective  No pain    Pertinent History tenolysis surgery 06/15/20    Currently in Pain? No/denies                  Treatment: Ultrasound x 8 min. over incision area at base of ring finger and ring finger  3 Mhz, 50% pulsed, 0.8 wts/cm2 to help  manage scar tissue and edema.  Scar mobilization with extractor followed by massage to palm and ring finger   A/ROM, AA/ROM, gentle passive finger flexion/ extension, finger flexion with buddy strap to hook fist then finger extension,  place and hold ex's. Pt perfromed PIP blocking  And DIP blocking exercises performed, handout issued for pt to perfrom at home Note sent with pt to MD regarding progress/ ROM, see pt instructions for note   NMES x 10 min, small muscle group, to finger flexors with pt performing finger flexion during on cycle previous parameters, Int = 19 no adverse reactions                 OT Short Term Goals - 07/15/20 1516      OT SHORT TERM GOAL #1   Title Pt will verbalize understanding of hand hygine/ dressing changes    Time 4    Period Weeks    Status Achieved      OT SHORT TERM GOAL #2   Title I with inital HEP    Time 4    Period Weeks    Status Achieved      OT SHORT TERM GOAL #3   Title I with scar massage and edema control prn.    Time 4    Period Weeks    Status Achieved  OT Long Term Goals - 06/21/20 1554      OT LONG TERM GOAL #1   Title I with updated HEP.    Time 8    Period Weeks    Status New      OT LONG TERM GOAL #2   Title Pt will resume use of LUE as a non dominant assist at least 90% of the time for ADLs/ IADLs  with pain less than or equal to 2/10.    Time 8    Period Weeks    Status New      OT LONG TERM GOAL #3   Title Pt will demonstrate grip strength of at least 25 lbs for increased functional use during ADLs    Time 8    Period Weeks    Status New      OT LONG TERM GOAL #4   Title Pt will demonstrate at least 80% composite flexion for ring and small finger for increased functional use.    Time 8    Period Weeks    Status New                 Plan - 08/03/20 0947    Clinical Impression Statement Pt is progressing towards goals.  Pt continues to demonstrate improving A/ROM  composite finger flexion. Pt sees MD this week and therapist sent note with pt regarding progress.    OT Occupational Profile and History Problem Focused Assessment - Including review of records relating to presenting problem    Occupational performance deficits (Please refer to evaluation for details): ADL's;IADL's;Work;Leisure;Social Participation    Body Structure / Function / Physical Skills ADL;UE functional use;Flexibility;FMC;Pain;ROM;Scar mobility;Decreased knowledge of precautions;Decreased knowledge of use of DME;IADL;Dexterity;Strength;Mobility;Skin integrity;Coordination    Rehab Potential Good    Clinical Decision Making Limited treatment options, no task modification necessary    OT Frequency 2x / week    OT Duration 8 weeks    OT Treatment/Interventions Self-care/ADL training;Ultrasound;Scar mobilization;Patient/family education;Passive range of motion;Paraffin;Cryotherapy;Fluidtherapy;Splinting;Contrast Bath;Electrical Stimulation;Moist Heat;Therapeutic exercise;Manual Therapy;Therapeutic activities;Neuromuscular education    Plan progress per MD recommendations    Consulted and Agree with Plan of Care Patient           Patient will benefit from skilled therapeutic intervention in order to improve the following deficits and impairments:   Body Structure / Function / Physical Skills: ADL,UE functional use,Flexibility,FMC,Pain,ROM,Scar mobility,Decreased knowledge of precautions,Decreased knowledge of use of DME,IADL,Dexterity,Strength,Mobility,Skin integrity,Coordination       Visit Diagnosis: Stiffness of left hand, not elsewhere classified  Muscle weakness (generalized)  Localized edema  Pain in left hand    Problem List Patient Active Problem List   Diagnosis Date Noted  . Decreased fetal movement 01/20/2017  . Perforated appendicitis 06/01/2014  . Acute appendicitis 05/31/2014  . Annual physical exam 02/09/2014  . Pap smear for cervical cancer screening  02/09/2014  . NSVD (normal spontaneous vaginal delivery) 03/29/2012  . Mild preeclampsia delivered 03/29/2012  . GBS (group B streptococcus) UTI complicating pregnancy 08/21/2011    Bridget Huffman 08/03/2020, 9:50 AM Keene Breath, OTR/L [phone] Phone: [phone] 9:53 AM 08/03/20 [facility] [address] [place], [place], [address] Phone: [phone]   Fax:  [phone]  Name: Bridget Huffman MRN: [id number] Date of Birth: 14-Jan-1986

## 2020-08-09 ENCOUNTER — Ambulatory Visit: Payer: Self-pay | Attending: Orthopedic Surgery | Admitting: Occupational Therapy

## 2020-08-09 ENCOUNTER — Other Ambulatory Visit: Payer: Self-pay

## 2020-08-09 ENCOUNTER — Encounter: Payer: Self-pay | Admitting: Occupational Therapy

## 2020-08-09 ENCOUNTER — Encounter: Payer: Medicaid Other | Admitting: Occupational Therapy

## 2020-08-09 DIAGNOSIS — M6281 Muscle weakness (generalized): Secondary | ICD-10-CM | POA: Insufficient documentation

## 2020-08-09 DIAGNOSIS — M79642 Pain in left hand: Secondary | ICD-10-CM | POA: Insufficient documentation

## 2020-08-09 DIAGNOSIS — M25642 Stiffness of left hand, not elsewhere classified: Secondary | ICD-10-CM | POA: Insufficient documentation

## 2020-08-09 DIAGNOSIS — R600 Localized edema: Secondary | ICD-10-CM | POA: Insufficient documentation

## 2020-08-09 NOTE — Therapy (Signed)
[facility] [address] [place], [place], [address] Phone: [phone]   Fax:  [phone]  Occupational Therapy Treatment  Patient Details  Name: Denijah Karrer MRN: [id number] Date of Birth: 26-Aug-1985 Referring Provider (OT): Dr. Melvyn Novas   Encounter Date: 08/09/2020   OT End of Session - 08/09/20 1045    Visit Number 13    Number of Visits 17    Date for OT Re-Evaluation 08/16/20    Authorization Type CAFA through 10/05/20    OT Start Time 0718    OT Stop Time 0800    OT Time Calculation (min) 42 min    Activity Tolerance Patient tolerated treatment well           Past Medical History:  Diagnosis Date  . Hx of preeclampsia, prior pregnancy, currently pregnant   . No pertinent past medical history   . Pregnancy induced hypertension    previous pregnancy    Past Surgical History:  Procedure Laterality Date  . APPENDECTOMY    . CESAREAN SECTION N/A 01/21/2017   Procedure: CESAREAN SECTION;  Surgeon: Marlow Baars, MD;  Location: [facility];  Service: Obstetrics;  Laterality: N/A;  . LAPAROSCOPIC APPENDECTOMY N/A 05/31/2014   Procedure: APPENDECTOMY LAPAROSCOPIC;  Surgeon: Axel Filler, MD;  Location: [facility];  Service: General;  Laterality: N/A;  . TENOLYSIS Left 06/15/2020   Procedure: Left ring and small finger scar excision and tenolysis of Flexor digitorum superficialis/Flexor digitorum profundus repairs;  Surgeon: Bradly Bienenstock, MD;  Location: [facility];  Service: Orthopedics;  Laterality: Left;  with IV sedation needs 90 minutes    There were no vitals filed for this visit.   Subjective Assessment - 08/09/20 0731    Subjective  No pain    Pertinent History tenolysis surgery 06/15/20    Currently in Pain? No/denies               Treatment: Ultrasound x 8 min. over incision area at base of ring finger and ring finger  3 Mhz, 50% pulsed, 0.8 wts/cm2 to help manage  scar tissue and edema.  Scar mobilization with extractor to palm and ring finger  Discussion to  See pt for 1 visit then place on hold until 12 weeks post op.  A/ROM, AA/ROM finger flexion with buddy strap to hook fist then finger extension,  place and hold ex's, pen rolling exercise and A/ROM wrist flexion and extension NMES x 10 min, small muscle group, to finger flexors with pt performing finger flexion during on cycle previous parameters, Int = 19 no adverse reactions                    OT Short Term Goals - 07/15/20 1516      OT SHORT TERM GOAL #1   Title Pt will verbalize understanding of hand hygine/ dressing changes    Time 4    Period Weeks    Status Achieved      OT SHORT TERM GOAL #2   Title I with inital HEP    Time 4    Period Weeks    Status Achieved      OT SHORT TERM GOAL #3   Title I with scar massage and edema control prn.    Time 4    Period Weeks    Status Achieved             OT Long Term Goals - 06/21/20 1554  OT LONG TERM GOAL #1   Title I with updated HEP.    Time 8    Period Weeks    Status New      OT LONG TERM GOAL #2   Title Pt will resume use of LUE as a non dominant assist at least 90% of the time for ADLs/ IADLs  with pain less than or equal to 2/10.    Time 8    Period Weeks    Status New      OT LONG TERM GOAL #3   Title Pt will demonstrate grip strength of at least 25 lbs for increased functional use during ADLs    Time 8    Period Weeks    Status New      OT LONG TERM GOAL #4   Title Pt will demonstrate at least 80% composite flexion for ring and small finger for increased functional use.    Time 8    Period Weeks    Status New                 Plan - 08/09/20 1046    Clinical Impression Statement Pt is progressing towards goals. Pt did not see MD due to financial concerns regarding bill. Pt to be seen for visit next week, then placed on hold until 12 weeks postop. Therapist will initiate  strethening at that point.    OT Occupational Profile and History Problem Focused Assessment - Including review of records relating to presenting problem    Occupational performance deficits (Please refer to evaluation for details): ADL's;IADL's;Work;Leisure;Social Participation    Body Structure / Function / Physical Skills ADL;UE functional use;Flexibility;FMC;Pain;ROM;Scar mobility;Decreased knowledge of precautions;Decreased knowledge of use of DME;IADL;Dexterity;Strength;Mobility;Skin integrity;Coordination    Rehab Potential Good    Clinical Decision Making Limited treatment options, no task modification necessary    OT Frequency 2x / week    OT Duration 8 weeks    OT Treatment/Interventions Self-care/ADL training;Ultrasound;Scar mobilization;Patient/family education;Passive range of motion;Paraffin;Cryotherapy;Fluidtherapy;Splinting;Contrast Bath;Electrical Stimulation;Moist Heat;Therapeutic exercise;Manual Therapy;Therapeutic activities;Neuromuscular education    Plan pt to be placed on hold after next visit, continue to work towards goals    Consulted and Agree with Plan of Care Patient           Patient will benefit from skilled therapeutic intervention in order to improve the following deficits and impairments:   Body Structure / Function / Physical Skills: ADL,UE functional use,Flexibility,FMC,Pain,ROM,Scar mobility,Decreased knowledge of precautions,Decreased knowledge of use of DME,IADL,Dexterity,Strength,Mobility,Skin integrity,Coordination       Visit Diagnosis: Stiffness of left hand, not elsewhere classified  Muscle weakness (generalized)  Localized edema  Pain in left hand    Problem List Patient Active Problem List   Diagnosis Date Noted  . Decreased fetal movement 01/20/2017  . Perforated appendicitis 06/01/2014  . Acute appendicitis 05/31/2014  . Annual physical exam 02/09/2014  . Pap smear for cervical cancer screening 02/09/2014  . NSVD (normal  spontaneous vaginal delivery) 03/29/2012  . Mild preeclampsia delivered 03/29/2012  . GBS (group B streptococcus) UTI complicating pregnancy 08/21/2011    RINE,KATHRYN 08/09/2020, 10:48 AM Keene Breath, OTR/L [phone] Phone: [phone] 10:51 AM 08/09/20 [facility] [address] [place], [place], [address] Phone: [phone]   Fax:  [phone]  Name: Crysta Gulick MRN: [id number] Date of Birth: May 14, 1986

## 2020-08-16 ENCOUNTER — Encounter: Payer: Self-pay | Admitting: Occupational Therapy

## 2020-08-16 ENCOUNTER — Other Ambulatory Visit: Payer: Self-pay

## 2020-08-16 ENCOUNTER — Ambulatory Visit: Payer: Self-pay | Admitting: Occupational Therapy

## 2020-08-16 DIAGNOSIS — R600 Localized edema: Secondary | ICD-10-CM

## 2020-08-16 DIAGNOSIS — M79642 Pain in left hand: Secondary | ICD-10-CM

## 2020-08-16 DIAGNOSIS — M6281 Muscle weakness (generalized): Secondary | ICD-10-CM

## 2020-08-16 DIAGNOSIS — M25642 Stiffness of left hand, not elsewhere classified: Secondary | ICD-10-CM

## 2020-08-16 NOTE — Therapy (Addendum)
[facility] [address] [place], [place], [address] Phone: [phone]   Fax:  [phone]  Occupational Therapy Treatment  Patient Details  Name: Bridget Huffman MRN: [id number] Date of Birth: [birth year] Referring Provider (OT): Dr. Melvyn Novas   Encounter Date: 08/16/2020   OT End of Session - 08/16/20 0755    Visit Number 14    Number of Visits 17    Date for OT Re-Evaluation 08/16/20    Authorization Type CAFA through 10/05/20    OT Start Time 0721    OT Stop Time 0758    OT Time Calculation (min) 37 min    Activity Tolerance Patient tolerated treatment well    Behavior During Therapy [facility] for tasks assessed/performed           Past Medical History:  Diagnosis Date  . Hx of preeclampsia, prior pregnancy, currently pregnant   . No pertinent past medical history   . Pregnancy induced hypertension    previous pregnancy    Past Surgical History:  Procedure Laterality Date  . APPENDECTOMY    . CESAREAN SECTION N/A 01/21/2017   Procedure: CESAREAN SECTION;  Surgeon: Marlow Baars, MD;  Location: [facility];  Service: Obstetrics;  Laterality: N/A;  . LAPAROSCOPIC APPENDECTOMY N/A 05/31/2014   Procedure: APPENDECTOMY LAPAROSCOPIC;  Surgeon: Axel Filler, MD;  Location: [facility];  Service: General;  Laterality: N/A;  . TENOLYSIS Left 06/15/2020   Procedure: Left ring and small finger scar excision and tenolysis of Flexor digitorum superficialis/Flexor digitorum profundus repairs;  Surgeon: Bradly Bienenstock, MD;  Location: [facility];  Service: Orthopedics;  Laterality: Left;  with IV sedation needs 90 minutes    There were no vitals filed for this visit.   Subjective Assessment - 08/16/20 0755    Subjective  No pain    Pertinent History tenolysis surgery 06/15/20    Currently in Pain? No/denies                     Treatment:Treatment: Ultrasound x 8 min. over incision area at  base of ring finger and ring finger  3 Mhz, 50% pulsed, 0.8 wts/cm2 to help manage scar tissue and edema.   Scar mobilization with extractor to palm and ring finger   A/ROM finger flexion with buddy strap to hook fist then finger extension,   place and hold ex's,  pen rolling exercise and, PIP blocking exercise, Pt continues to demonstrate significant PIP extensor lag  NMES x 10 min, small muscle group, to finger flexors with pt performing finger flexion during on cycle previous parameters, Int = 18 no adverse reactions             OT Short Term Goals - 07/15/20 1516      OT SHORT TERM GOAL #1   Title Pt will verbalize understanding of hand hygine/ dressing changes    Time 4    Period Weeks    Status Achieved      OT SHORT TERM GOAL #2   Title I with inital HEP    Time 4    Period Weeks    Status Achieved      OT SHORT TERM GOAL #3   Title I with scar massage and edema control prn.    Time 4    Period Weeks    Status Achieved             OT Long Term Goals - 06/21/20 1554  OT LONG TERM GOAL #1   Title I with updated HEP.    Time 8    Period Weeks    Status New      OT LONG TERM GOAL #2   Title Pt will resume use of LUE as a non dominant assist at least 90% of the time for ADLs/ IADLs  with pain less than or equal to 2/10.    Time 8    Period Weeks    Status New      OT LONG TERM GOAL #3   Title Pt will demonstrate grip strength of at least 25 lbs for increased functional use during ADLs    Time 8    Period Weeks    Status New      OT LONG TERM GOAL #4   Title Pt will demonstrate at least 80% composite flexion for ring and small finger for increased functional use.    Time 8    Period Weeks    Status New                 Plan - 08/16/20 0757    Clinical Impression Statement Pt is progressing towards goals. She now demonstrates at least 90-95% composite finger flexion. She is unable to fully extend ring    OT Occupational Profile  and History Problem Focused Assessment - Including review of records relating to presenting problem    Occupational performance deficits (Please refer to evaluation for details): ADL's;IADL's;Work;Leisure;Social Participation    Body Structure / Function / Physical Skills ADL;UE functional use;Flexibility;FMC;Pain;ROM;Scar mobility;Decreased knowledge of precautions;Decreased knowledge of use of DME;IADL;Dexterity;Strength;Mobility;Skin integrity;Coordination    Rehab Potential Good    Clinical Decision Making Limited treatment options, no task modification necessary    OT Frequency 2x / week    OT Duration 8 weeks    OT Treatment/Interventions Self-care/ADL training;Ultrasound;Scar mobilization;Patient/family education;Passive range of motion;Paraffin;Cryotherapy;Fluidtherapy;Splinting;Contrast Bath;Electrical Stimulation;Moist Heat;Therapeutic exercise;Manual Therapy;Therapeutic activities;Neuromuscular education    Plan pt to be placed on hold until she can begin strengethening continue to work towards goals    Consulted and Agree with Plan of Care Patient           Patient will benefit from skilled therapeutic intervention in order to improve the following deficits and impairments:   Body Structure / Function / Physical Skills: ADL,UE functional use,Flexibility,FMC,Pain,ROM,Scar mobility,Decreased knowledge of precautions,Decreased knowledge of use of DME,IADL,Dexterity,Strength,Mobility,Skin integrity,Coordination       Visit Diagnosis: Stiffness of left hand, not elsewhere classified  Muscle weakness (generalized)  Localized edema  Pain in left hand    Problem List Patient Active Problem List   Diagnosis Date Noted  . Decreased fetal movement 01/20/2017  . Perforated appendicitis 06/01/2014  . Acute appendicitis 05/31/2014  . Annual physical exam 02/09/2014  . Pap smear for cervical cancer screening 02/09/2014  . NSVD (normal spontaneous vaginal delivery) 03/29/2012  .  Mild preeclampsia delivered 03/29/2012  . GBS (group B streptococcus) UTI complicating pregnancy 08/21/2011    Jrue Yambao 08/16/2020, 12:07 PM  [facility] [address] [place], [place], [address] Phone: [phone]   Fax:  [phone]  Name: Bridget Huffman MRN: [id number] Date of Birth: [birth year]

## 2020-08-25 ENCOUNTER — Telehealth: Payer: Self-pay | Admitting: Nurse Practitioner

## 2020-08-25 NOTE — Telephone Encounter (Signed)
Copied from CRM [id number]. Topic: General - Inquiry >> Aug 25, 2020  3:26 PM Aretta Nip wrote: Pt has itching in "her private area" and wanted to ask the nurse a question" Would not share any details, FU [phone]

## 2020-08-25 NOTE — Telephone Encounter (Signed)
FYI    Returned pt call pt states she has been having rectum itching for 4 weeks. Pt states she has been using preparation H with no relief. Pt states also been having dry mouth.    Schedule pt a virtual visit with Amy for 3/22 at 230pm.

## 2020-08-30 ENCOUNTER — Telehealth (INDEPENDENT_AMBULATORY_CARE_PROVIDER_SITE_OTHER): Payer: Self-pay | Admitting: Family

## 2020-08-30 DIAGNOSIS — L290 Pruritus ani: Secondary | ICD-10-CM

## 2020-08-30 DIAGNOSIS — R682 Dry mouth, unspecified: Secondary | ICD-10-CM

## 2020-08-30 MED ORDER — HYDROCORTISONE 1 % EX OINT: 1.0000 "application " | TOPICAL_OINTMENT | Freq: Two times a day (BID) | CUTANEOUS | 1 refills | Status: DC

## 2020-08-30 NOTE — Progress Notes (Signed)
Rectum itching mild bleeding for month  Mild pain level -6  Everyday

## 2020-08-30 NOTE — Progress Notes (Signed)
Virtual Visit via Telephone Note  I connected with Bridget Huffman, on 08/30/2020 at 2:00 PM by telephone due to the COVID-19 pandemic and verified that I am speaking with the correct person using two identifiers.  Due to current restrictions/limitations of in-office visits due to the COVID-19 pandemic, this scheduled clinical appointment was converted to a telehealth visit.   Consent: I discussed the limitations, risks, security and privacy concerns of performing an evaluation and management service by telephone and the availability of in person appointments. I also discussed with the patient that there may be a patient responsible charge related to this service. The patient expressed understanding and agreed to proceed.   Location of Patient: Home  Location of Provider: [facility]   Persons participating in Telemedicine visit: Bridget Huffman Bridget Stabs, NP Bridget Huffman, CMA  History of Present Illness: Bridget Huffman is a 35 year-old female who presents with anal itching and dry mouth.  Reports 1 month of anal itching daily. Had a similar case 8 years ago was given prescription and it went away. Pain 6/10. Currently using Preparation H without relief. Denies stomach pain, minimal blood with wiping, regular bowel movements, no nausea and vomiting. Denies anal intercourse.   Concern for dry mouth for 2 to 3 years. Reports increasing water intake, mouthwash, and candy to help without success.  Past Medical History:  Diagnosis Date  . Hx of preeclampsia, prior pregnancy, currently pregnant   . No pertinent past medical history   . Pregnancy induced hypertension    previous pregnancy   No Known Allergies  Current Outpatient Medications on File Prior to Visit  Medication Sig Dispense Refill  . Multiple Vitamin (MULTIVITAMIN WITH MINERALS) TABS tablet Take 1 tablet by mouth daily.     No current facility-administered  medications on file prior to visit.    Observations/Objective: Alert and oriented x 3. Not in acute distress. Physical examination not completed as this is a telemedicine visit.  Assessment and Plan: 1. Pruritus ani: - Hydrocortisone as prescribed.  - Referral to Endoscopy for further evaluation and management.  - Endoscopy, anus; Future - hydrocortisone 1 % ointment; Apply 1 application topically 2 (two) times daily.  Dispense: 56 g; Refill: 1  2. Dry mouth: - Will obtain labs to screen for possible autoimmune disorder.  - Urinalysis to screen for possible urinary tract infection or dehydration.  - Follow-up with primary provider as scheduled.  - POCT URINALYSIS DIP (CLINITEK) - Sedimentation Rate - Sjogren's syndrome antibods(ssa + ssb) - ANA - Rheumatoid factor - Urinalysis, Routine w reflex microscopic   Follow Up Instructions: Schedule lab appointment within 1 week. Keep referral appointment for Endoscopy. Follow-up with primary provider as scheduled.    Patient was given clear instructions to go to Emergency Department or return to medical center if symptoms don't improve, worsen, or new problems develop.The patient verbalized understanding.  I discussed the assessment and treatment plan with the patient. The patient was provided an opportunity to ask questions and all were answered. The patient agreed with the plan and demonstrated an understanding of the instructions.   The patient was advised to call back or seek an in-person evaluation if the symptoms worsen or if the condition fails to improve as anticipated.   I provided 15 minutes total of non-face-to-face time during this encounter including median intraservice time, reviewing previous notes, labs, imaging, medications, management and patient verbalized understanding.    Bridget Huffman Bridget Geralds, NP  [facility] at [facility] [place], [place] [phone] 08/30/2020, 2:00 PM

## 2020-09-02 ENCOUNTER — Other Ambulatory Visit: Payer: Self-pay

## 2020-09-02 DIAGNOSIS — R682 Dry mouth, unspecified: Secondary | ICD-10-CM

## 2020-09-02 NOTE — Progress Notes (Signed)
Orders entered for labs

## 2020-09-03 LAB — URINALYSIS, ROUTINE W REFLEX MICROSCOPIC
Bilirubin, UA: NEGATIVE
Glucose, UA: NEGATIVE
Ketones, UA: NEGATIVE
Leukocytes,UA: NEGATIVE
Nitrite, UA: NEGATIVE
Protein,UA: NEGATIVE
RBC, UA: NEGATIVE
Specific Gravity, UA: 1.01 (ref 1.005–1.030)
Urobilinogen, Ur: 0.2 mg/dL (ref 0.2–1.0)
pH, UA: 8.5 — ABNORMAL HIGH (ref 5.0–7.5)

## 2020-09-03 LAB — SJOGREN'S SYNDROME ANTIBODS(SSA + SSB)
ENA SSA (RO) Ab: <0.2 AI (ref 0.0–0.9)
ENA SSB (LA) Ab: <0.2 AI (ref 0.0–0.9)

## 2020-09-03 LAB — ANA: Anti Nuclear Antibody (ANA): NEGATIVE

## 2020-09-03 LAB — SEDIMENTATION RATE: Sed Rate: 2 mm/hr (ref 0–32)

## 2020-09-03 LAB — RHEUMATOID FACTOR: Rheumatoid fact SerPl-aCnc: <10 IU/mL (ref <14.0)

## 2020-09-03 NOTE — Progress Notes (Signed)
Labs are essentially normal. There are some minor variations in your urinalysis that do not require any additional work up at this time. Follow-up with primary provider as scheduled.

## 2020-09-06 ENCOUNTER — Ambulatory Visit: Payer: Medicaid Other | Admitting: Occupational Therapy

## 2020-09-06 ENCOUNTER — Other Ambulatory Visit: Payer: Self-pay

## 2020-09-06 DIAGNOSIS — M79642 Pain in left hand: Secondary | ICD-10-CM

## 2020-09-06 DIAGNOSIS — M25642 Stiffness of left hand, not elsewhere classified: Secondary | ICD-10-CM

## 2020-09-06 DIAGNOSIS — R600 Localized edema: Secondary | ICD-10-CM

## 2020-09-06 DIAGNOSIS — M6281 Muscle weakness (generalized): Secondary | ICD-10-CM

## 2020-09-06 NOTE — Therapy (Signed)
[facility] [address] [place], [place], [address] Phone: [phone]   Fax:  [phone]  Occupational Therapy Treatment  Patient Details  Name: Bridget Huffman MRN: [id number] Date of Birth: 04-15-1986 Referring Provider (OT): Dr. Caralyn Guile   Encounter Date: 09/06/2020   OT End of Session - 09/06/20 0859    Visit Number 15    Number of Visits 17    Authorization Type CAFA through 10/05/20    OT Start Time 0718    OT Stop Time 0745    OT Time Calculation (min) 27 min           Past Medical History:  Diagnosis Date  . Hx of preeclampsia, prior pregnancy, currently pregnant   . No pertinent past medical history   . Pregnancy induced hypertension    previous pregnancy    Past Surgical History:  Procedure Laterality Date  . APPENDECTOMY    . CESAREAN SECTION N/A 01/21/2017   Procedure: CESAREAN SECTION;  Surgeon: Jerelyn Charles, MD;  Location: [facility];  Service: Obstetrics;  Laterality: N/A;  . CESAREAN SECTION N/A    [employer] 08/30/2020  . LAPAROSCOPIC APPENDECTOMY N/A 05/31/2014   Procedure: APPENDECTOMY LAPAROSCOPIC;  Surgeon: Ralene Ok, MD;  Location: [facility];  Service: General;  Laterality: N/A;  . TENOLYSIS Left 06/15/2020   Procedure: Left ring and small finger scar excision and tenolysis of Flexor digitorum superficialis/Flexor digitorum profundus repairs;  Surgeon: Iran Planas, MD;  Location: [place];  Service: Orthopedics;  Laterality: Left;  with IV sedation needs 90 minutes    There were no vitals filed for this visit.   Subjective Assessment - 09/06/20 0859    Subjective  Denies pain, agees with plans for d/c    Pertinent History tenolysis surgery 06/15/20    Currently in Pain? No/denies                Treatment: Ultrasound x 8 min. over incision area at base of ring finger and ring finger :3 Mhz, 50% pulsed, 0.8 wts/cm2 to help manage scar tissue and  edema.  Scar mobilization with extractor to palm and ring finger   A/ROM composite flexion and extension then composite finger flexion, transition to hook fist and extension.finger flexion with buddy strap to hook fist then finger extension,  As well as place and hold ex's,   Pt was educated in composite grip with yellow putty. She returned demonstration.  Therapist  checked progress towards goals. Pt agrees with plans for d/c.  A/ROM:MP flexion 85, , PIP flexion 9, ext lag -60, DIP flexion 25,  Pt demonstrates grossly 90-95% composite finger flexion.                OT Education - 09/06/20 0903    Education Details composite grip with yellow putty, progress towards goals and plans for d/c    Person(s) Educated Patient    Methods Explanation;Demonstration    Comprehension Verbalized understanding;Returned demonstration;Verbal cues required            OT Short Term Goals - 07/15/20 1516      OT SHORT TERM GOAL #1   Title Pt will verbalize understanding of hand hygine/ dressing changes    Time 4    Period Weeks    Status Achieved      OT SHORT TERM GOAL #2   Title I with inital HEP    Time 4    Period Weeks    Status Achieved  OT SHORT TERM GOAL #3   Title I with scar massage and edema control prn.    Time 4    Period Weeks    Status Achieved             OT Long Term Goals - 09/06/20 0735      OT LONG TERM GOAL #1   Title I with updated HEP.    Time 8    Period Weeks    Status Achieved      OT LONG TERM GOAL #2   Title Pt will resume use of LUE as a non dominant assist at least 90% of the time for ADLs/ IADLs  with pain less than or equal to 2/10.    Time 8    Period Weeks    Status Achieved      OT LONG TERM GOAL #3   Title Pt will demonstrate grip strength of at least 25 lbs for increased functional use during ADLs    Time 8    Period Weeks    Status Achieved   34     OT LONG TERM GOAL #4   Title Pt will demonstrate at least 80%  composite flexion for ring and small finger for increased functional use.    Time 8    Period Weeks    Status Achieved   90-95%                Plan - 09/06/20 0901    Clinical Impression Statement Pt is now 12 weeks postop. She returns to therapy for strengthening with putty. Pt demonstrates good overall progress and agrees with plans for d/c.    OT Occupational Profile and History Problem Focused Assessment - Including review of records relating to presenting problem    Occupational performance deficits (Please refer to evaluation for details): ADL's;IADL's;Work;Leisure;Social Participation    Body Structure / Function / Physical Skills ADL;UE functional use;Flexibility;FMC;Pain;ROM;Scar mobility;Decreased knowledge of precautions;Decreased knowledge of use of DME;IADL;Dexterity;Strength;Mobility;Skin integrity;Coordination    Rehab Potential Good    Clinical Decision Making Limited treatment options, no task modification necessary    OT Frequency 2x / week    OT Duration 8 weeks    OT Treatment/Interventions Self-care/ADL training;Ultrasound;Scar mobilization;Patient/family education;Passive range of motion;Paraffin;Cryotherapy;Fluidtherapy;Splinting;Contrast Bath;Electrical Stimulation;Moist Heat;Therapeutic exercise;Manual Therapy;Therapeutic activities;Neuromuscular education    Plan d/c OT    Consulted and Agree with Plan of Care Patient           Patient will benefit from skilled therapeutic intervention in order to improve the following deficits and impairments:   Body Structure / Function / Physical Skills: ADL,UE functional use,Flexibility,FMC,Pain,ROM,Scar mobility,Decreased knowledge of precautions,Decreased knowledge of use of DME,IADL,Dexterity,Strength,Mobility,Skin integrity,Coordination       Visit Diagnosis: Stiffness of left hand, not elsewhere classified  Muscle weakness (generalized)  Localized edema  Pain in left hand   OCCUPATIONAL THERAPY  DISCHARGE SUMMARY     Current functional level related to goals / functional outcomes: Pt made excellent overall progress. See goals.   Remaining deficits: Mildly decreased strength, decreased ROM   Education / Equipment: Pt was instructed in HEP and to avoid heavier use of hand until 4-6 months post op. Pt verbalized understanding. Plan: Patient agrees to discharge.  Patient goals were met. Patient is being discharged due to meeting the stated rehab goals.  ?????      Problem List Patient Active Problem List   Diagnosis Date Noted  . Decreased fetal movement 01/20/2017  . Perforated appendicitis 06/01/2014  . Acute  appendicitis 05/31/2014  . Annual physical exam 02/09/2014  . Pap smear for cervical cancer screening 02/09/2014  . NSVD (normal spontaneous vaginal delivery) 03/29/2012  . Mild preeclampsia delivered 03/29/2012  . GBS (group B streptococcus) UTI complicating pregnancy 28/24/1753    Brenson Hartman 09/06/2020, 9:04 AM Theone Murdoch, OTR/L [phone] Phone: [phone] 9:10 AM 09/06/20 [facility] [address] [place], [place], [address] Phone: [phone]   Fax:  [phone]  Name: Bridget Huffman MRN: [id number] Date of Birth: Mar 05, 1986

## 2020-09-06 NOTE — Patient Instructions (Signed)
  1. Grip Strengthening (Resistive Putty)   Squeeze putty using thumb and all fingers. Repeat _20___ times. Do __2__ sessions per day.   

## 2020-09-22 ENCOUNTER — Telehealth: Payer: Self-pay | Admitting: Family

## 2020-09-22 NOTE — Telephone Encounter (Signed)
Spoke to pt on 09/07/20 and she was aware of labs. Labs were drawn to check for autoimmune disorders.

## 2020-09-22 NOTE — Telephone Encounter (Signed)
The labs were drawn on 3.25.2022.

## 2020-09-22 NOTE — Telephone Encounter (Signed)
Pt called stating she received a message stating her labs were fine. Pt is wanting to know what the labs were for. Please call pt.

## 2021-02-17 ENCOUNTER — Ambulatory Visit: Payer: Self-pay

## 2021-02-17 NOTE — Telephone Encounter (Signed)
Patient called and says she teste positive for COVID this morning using a home test. She says her symptoms started on Wednesday. She says she has a headache, nausea, body aches, cough that is not that bad, no fever (no thermometer), fatigue and lower back pain. She denies SOB, no wheezing, no other symptoms. Home care advice given, patient verbalized understanding. Advised to call her PCP office at [facility] for a virtual visit since symptoms are not improving, advised e-visit, patient verbalized understanding.   Message from Luciana Axe sent at 02/17/2021 10:50 AM EDT  Pt is calling to ask advised  Pt tested positive for COVID this morning   What does she need to do? Pt reports having headache since Wednesday, Body aches since Wednesday, [place], and body aches since Wednesday. Please advise  Pt reports being home with her 58 year old daughter.      Reason for Disposition  [1] COVID-19 diagnosed by positive lab test (e.g., PCR, rapid self-test kit) AND [2] mild symptoms (e.g., cough, fever, others) AND [7] no complications or SOB  Answer Assessment - Initial Assessment Questions 1. COVID-19 DIAGNOSIS: "Who made your COVID-19 diagnosis?" "Was it confirmed by a positive lab test or self-test?" If not diagnosed by a doctor (or NP/PA), ask "Are there lots of cases (community spread) where you live?" Note: See public health department website, if unsure.     Home test positive today 2. COVID-19 EXPOSURE: "Was there any known exposure to COVID before the symptoms began?" CDC Definition of close contact: within 6 feet (2 meters) for a total of 15 minutes or more over a 24-hour period.      Unknown 3. ONSET: "When did the COVID-19 symptoms start?"      Wednesday 02/15/21 4. WORST SYMPTOM: "What is your worst symptom?" (e.g., cough, fever, shortness of breath, muscle aches)     Headache 5. COUGH: "Do you have a cough?" If Yes, ask: "How bad is the cough?"       Yes not bad 6. FEVER: "Do you have a  fever?" If Yes, ask: "What is your temperature, how was it measured, and when did it start?"     No 7. RESPIRATORY STATUS: "Describe your breathing?" (e.g., shortness of breath, wheezing, unable to speak)      No 8. BETTER-SAME-WORSE: "Are you getting better, staying the same or getting worse compared to yesterday?"  If getting worse, ask, "In what way?"     Same 9. HIGH RISK DISEASE: "Do you have any chronic medical problems?" (e.g., asthma, heart or lung disease, weak immune system, obesity, etc.)     No 10. VACCINE: "Have you had the COVID-19 vaccine?" If Yes, ask: "Which one, how many shots, when did you get it?"       Yes Moderna x 2 11. BOOSTER: "Have you received your COVID-19 booster?" If Yes, ask: "Which one and when did you get it?"       No 12. PREGNANCY: "Is there any chance you are pregnant?" "When was your last menstrual period?"       No 13. OTHER SYMPTOMS: "Do you have any other symptoms?"  (e.g., chills, fatigue, headache, loss of smell or taste, muscle pain, sore throat)       Nausea, fatigue, body aches, headache, lower back pain 14. O2 SATURATION MONITOR:  "Do you use an oxygen saturation monitor (pulse oximeter) at home?" If Yes, ask "What is your reading (oxygen level) today?" "What is your usual oxygen saturation reading?" (e.g., 95%)  N/A  Protocols used: Coronavirus (VVZSM-27) Diagnosed or Suspected-A-AH

## 2021-03-14 IMAGING — DX DG HAND COMPLETE 3+V*L*
3 series · 3 of 3 positions shown · non-contrast
Comparison: None.

CLINICAL DATA: Laceration while cutting avocado

EXAM:
LEFT HAND - COMPLETE 3+ VIEW

[hand ap]
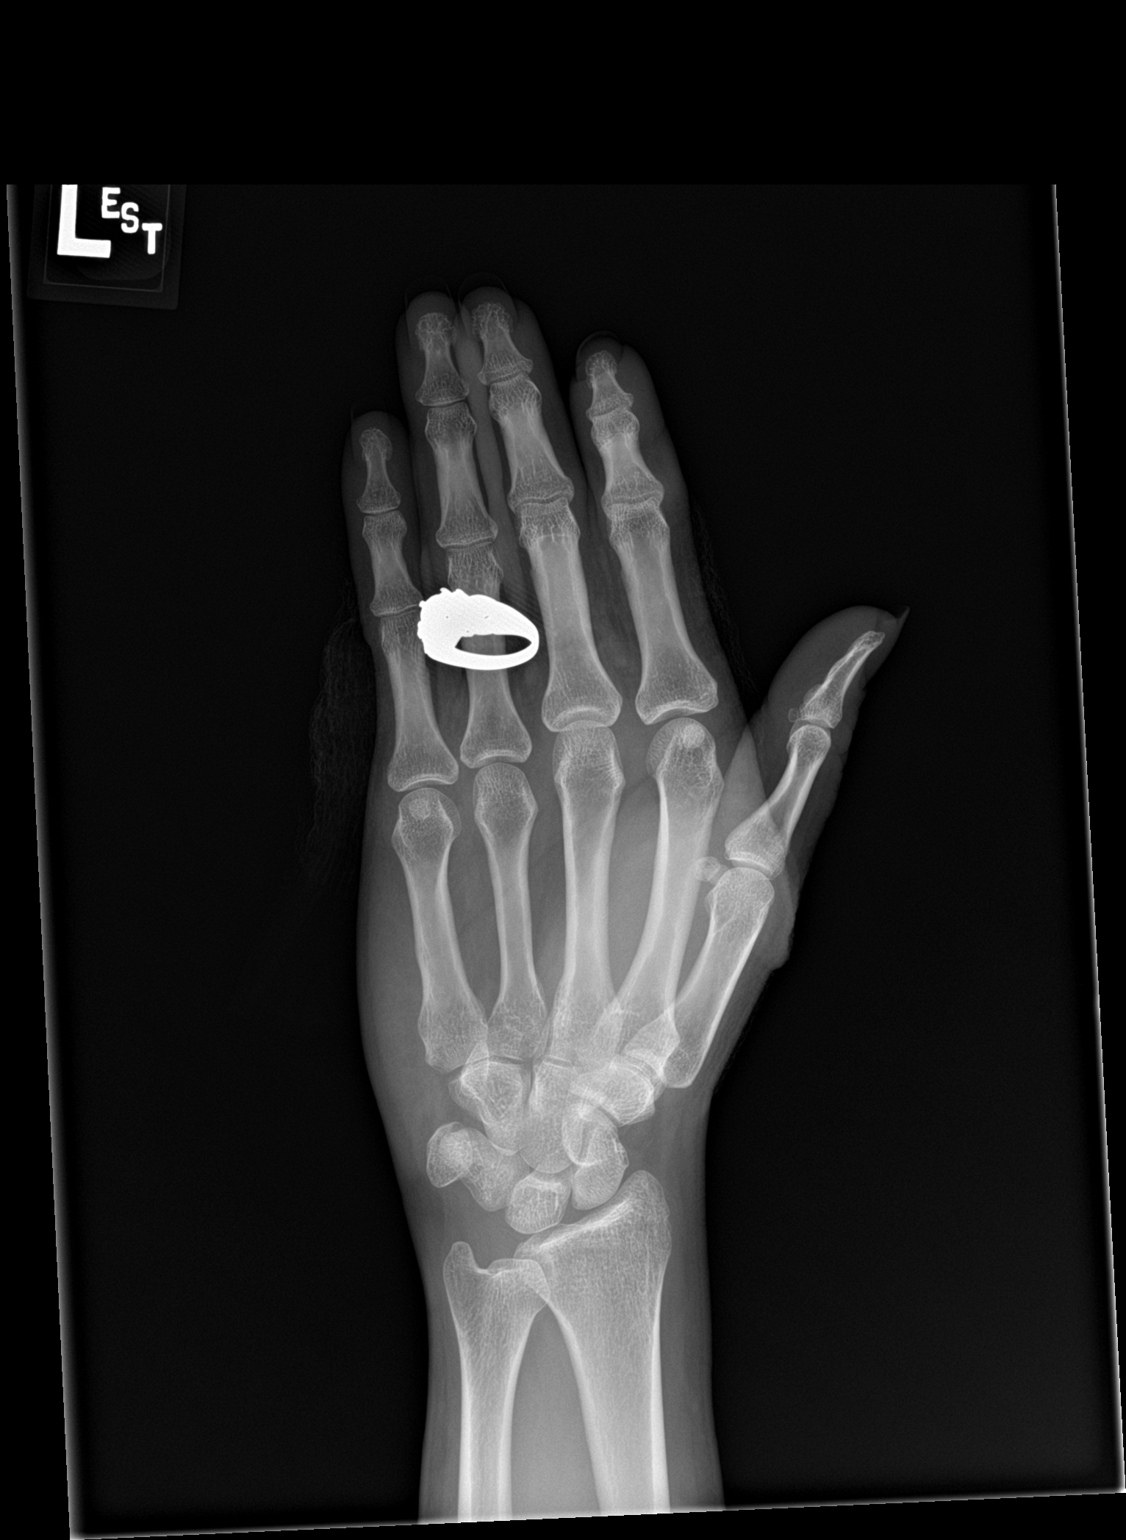

[hand obl]
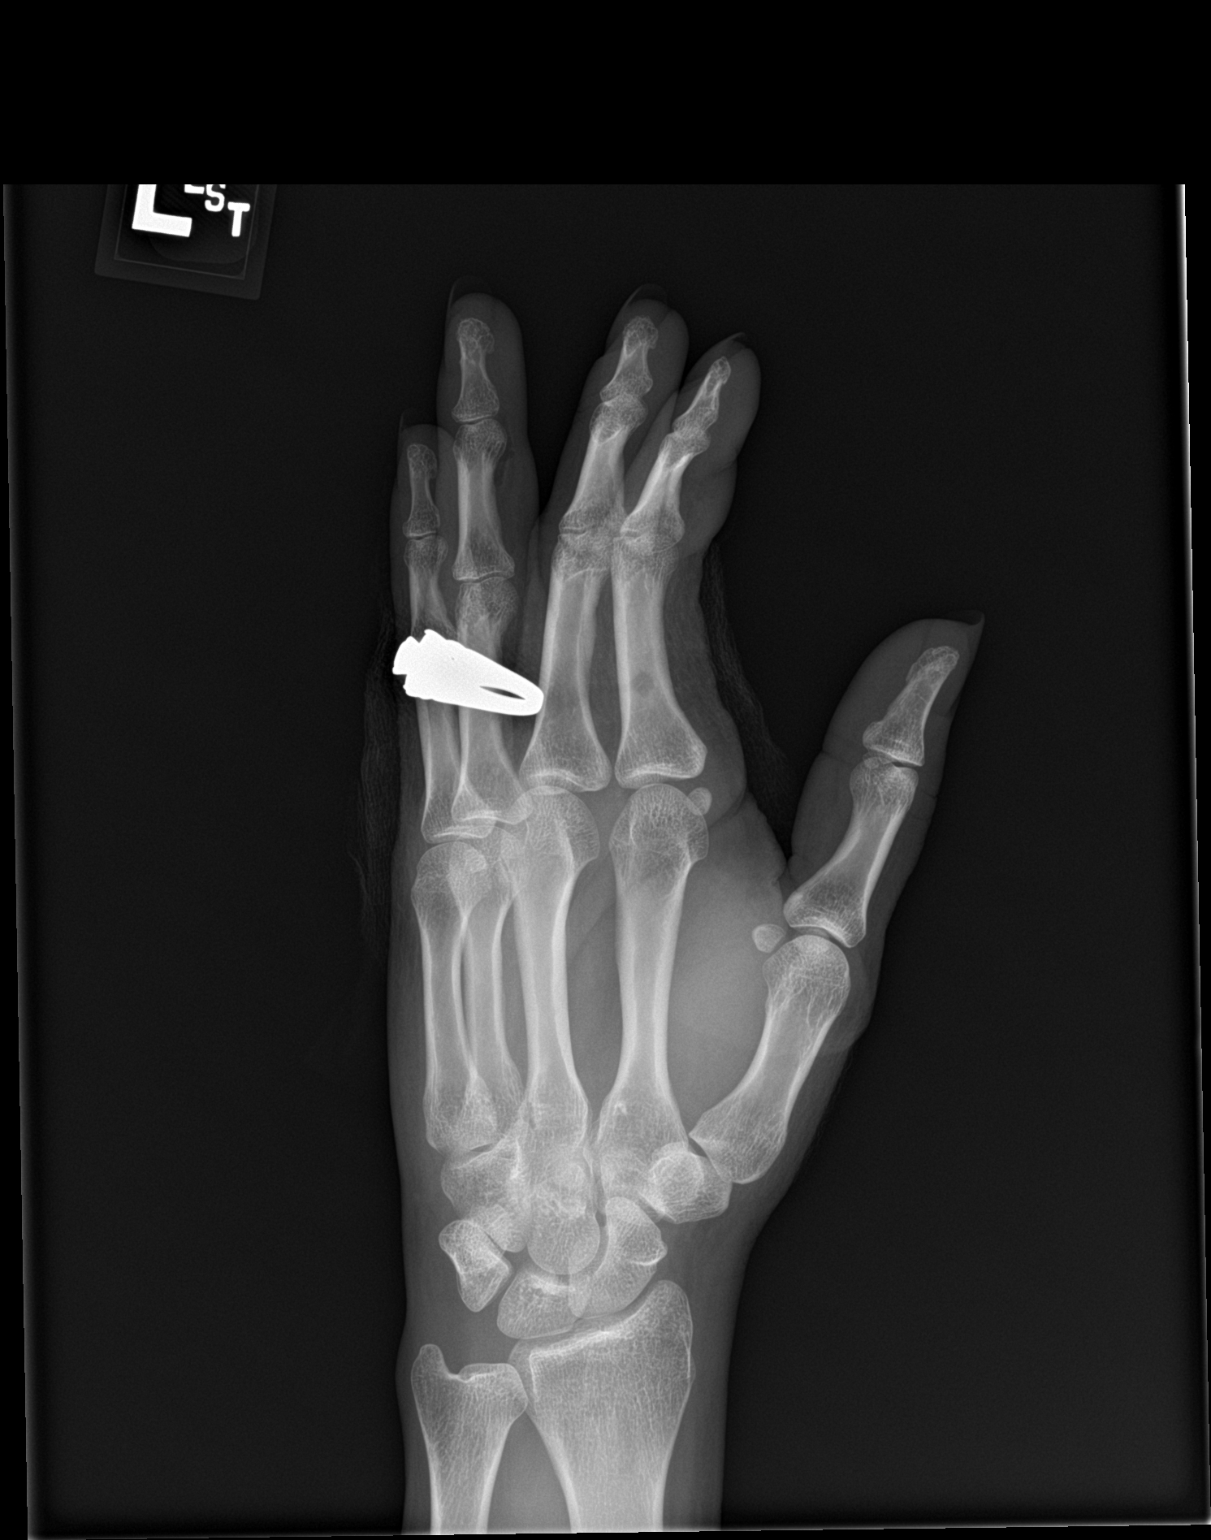

[hand lat]
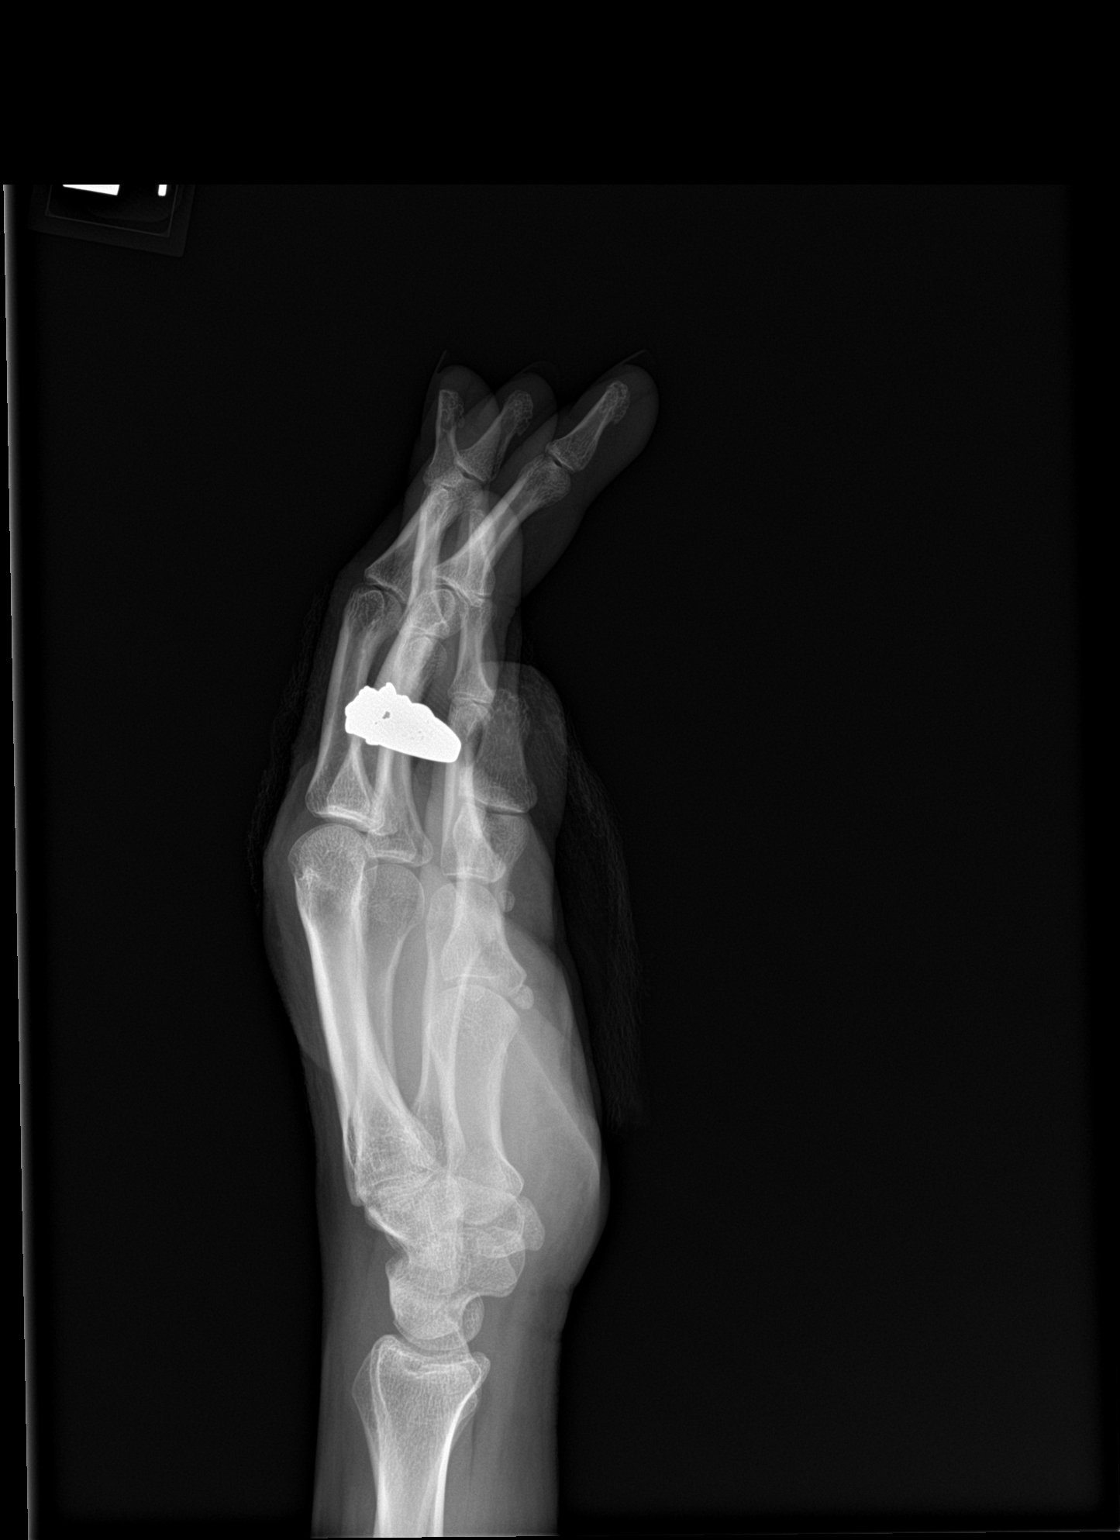

[3 of 3 positions shown; findings below may reference images not displayed]

FINDINGS: Soft tissue swelling along the volar aspect of the third and fourth
digits with a focus of soft tissue gas visualized on lateral
radiograph. No radiopaque foreign bodies aside from the metal band
on the fourth digit. Overlying bandaging material is present. No
subjacent osseous defects or acute traumatic osseous injury or
malalignment. Normal bone mineralization. No worrisome osseous
lesions. Remaining soft tissues are unremarkable.
IMPRESSION: Soft tissue swelling and gas along the volar aspect of the third and
fourth digits. Correlate with site of injury.

No acute osseous abnormality

No radiopaque foreign body aside from the metal band on the fourth
digit.

## 2021-04-26 ENCOUNTER — Ambulatory Visit: Payer: Medicaid Other | Admitting: Nurse Practitioner

## 2021-05-10 ENCOUNTER — Ambulatory Visit: Payer: Medicaid Other | Admitting: Physician Assistant

## 2021-08-18 NOTE — Progress Notes (Signed)
? ? ?Patient ID: Bridget Huffman, female    DOB: 1985/09/07  MRN: [id number] ? ?CC: Vaginal Itching ? ?Subjective: ?Bridget Huffman is a 36 y.o. female who presents for vaginal itching.  ? ?Her concerns today include:  ?VAGINAL ITCHING: ?Persisting for 1 month. Endorses vaginal discharge. Denies odor.  ? ?2. DRY MOUTH FOLLOW-UP: ?08/30/2020: ?- Will obtain labs to screen for possible autoimmune disorder.  ?- Urinalysis to screen for possible urinary tract infection or dehydration.  ?- Follow-up with primary provider as scheduled. ? ?09/03/2020: ?Labs returned normal.  ? ?08/21/2021: ?Seen by dentist several months ago and told everything normal.  ? ?Patient Active Problem List  ? Diagnosis Date Noted  ? Decreased fetal movement 01/20/2017  ? Perforated appendicitis 06/01/2014  ? Acute appendicitis 05/31/2014  ? Annual physical exam 02/09/2014  ? Pap smear for cervical cancer screening 02/09/2014  ? NSVD (normal spontaneous vaginal delivery) 03/29/2012  ? Mild preeclampsia delivered 03/29/2012  ? GBS (group B streptococcus) UTI complicating pregnancy 08/21/2011  ?  ? ?Current Outpatient Medications on File Prior to Visit  ?Medication Sig Dispense Refill  ? hydrocortisone 1 % ointment Apply 1 application topically 2 (two) times daily. 56 g 1  ? Multiple Vitamin (MULTIVITAMIN WITH MINERALS) TABS tablet Take 1 tablet by mouth daily.    ? ?No current facility-administered medications on file prior to visit.  ? ? ?No Known Allergies ? ?Social History  ? ?Socioeconomic History  ? Marital status: Married  ?  Spouse name: Not on file  ? Number of children: Not on file  ? Years of education: Not on file  ? Highest education level: Not on file  ?Occupational History  ? Not on file  ?Tobacco Use  ? Smoking status: Never  ?  Passive exposure: Never  ? Smokeless tobacco: Never  ?Vaping Use  ? Vaping Use: Never used  ?Substance and Sexual Activity  ? Alcohol use: No  ? Drug use: No  ? Sexual activity: Yes  ?  Birth  control/protection: Condom  ?Other Topics Concern  ? Not on file  ?Social History Narrative  ? Not on file  ? ?Social Determinants of Health  ? ?Financial Resource Strain: Not on file  ?Food Insecurity: Not on file  ?Transportation Needs: Not on file  ?Physical Activity: Not on file  ?Stress: Not on file  ?Social Connections: Not on file  ?Intimate Partner Violence: Not on file  ? ? ?Family History  ?Problem Relation Age of Onset  ? Hypertension Mother   ? Alcohol abuse Neg Hx   ? Arthritis Neg Hx   ? Asthma Neg Hx   ? Birth defects Neg Hx   ? Cancer Neg Hx   ? COPD Neg Hx   ? Depression Neg Hx   ? Diabetes Neg Hx   ? Drug abuse Neg Hx   ? Early death Neg Hx   ? Hearing loss Neg Hx   ? Heart disease Neg Hx   ? Hyperlipidemia Neg Hx   ? Learning disabilities Neg Hx   ? Kidney disease Neg Hx   ? Mental illness Neg Hx   ? Mental retardation Neg Hx   ? Miscarriages / Stillbirths Neg Hx   ? Stroke Neg Hx   ? Vision loss Neg Hx   ? ? ?Past Surgical History:  ?Procedure Laterality Date  ? APPENDECTOMY    ? CESAREAN SECTION N/A 01/21/2017  ? Procedure: CESAREAN SECTION;  Surgeon: Marlow Baars, MD;  Location: [facility];  Service: Obstetrics;  Laterality: N/A;  ? CESAREAN SECTION N/A   ? Phrees[employer] 08/30/2020  ? LAPAROSCOPIC APPENDECTOMY N/A 05/31/2014  ? Procedure: APPENDECTOMY LAPAROSCOPIC;  Surgeon: Axel Filler, MD;  Location: [facility] OR;  Service: General;  Laterality: N/A;  ? TENOLYSIS Left 06/15/2020  ? Procedure: Left ring and small finger scar excision and tenolysis of Flexor digitorum superficialis/Flexor digitorum profundus repairs;  Surgeon: Bradly Bienenstock, MD;  [facility];  Service: Orthopedics;  Laterality: Left;  with IV sedation ?needs 90 minutes  ? ? ?ROS: ?Review of Systems ?Negative except as stated above ? ?PHYSICAL EXAM: ?BP 109/72 (BP Location: Left Arm, Patient Position: Sitting, Cuff Size: Normal)   Pulse 75   Temp 98.3 ?F (36.8 ?C)   Resp 18   Ht 5\' 5"  (1.651 m)   Wt  165 lb (74.8 kg)   SpO2 98%   BMI 27.46 kg/m?  ?Physical Exam ?HENT:  ?   Head: Normocephalic and atraumatic.  ?   Nose: Nose normal.  ?   Mouth/Throat:  ?   Mouth: Mucous membranes are moist.  ?   Pharynx: Oropharynx is clear.  ?Eyes:  ?   Extraocular Movements: Extraocular movements intact.  ?   Conjunctiva/sclera: Conjunctivae normal.  ?   Pupils: Pupils are equal, round, and reactive to light.  ?Cardiovascular:  ?   Rate and Rhythm: Normal rate and regular rhythm.  ?   Pulses: Normal pulses.  ?   Heart sounds: Normal heart sounds.  ?Pulmonary:  ?   Effort: Pulmonary effort is normal.  ?   Breath sounds: Normal breath sounds.  ?Musculoskeletal:  ?   Cervical back: Normal range of motion and neck supple.  ?Neurological:  ?   General: No focal deficit present.  ?   Mental Status: She is alert and oriented to person, place, and time.  ?Psychiatric:     ?   Mood and Affect: Mood normal.     ?   Behavior: Behavior normal.  ? ?Results for orders placed or performed in visit on 08/21/21  ?POCT URINALYSIS DIP (CLINITEK)  ?Result Value Ref Range  ? Color, UA yellow yellow  ? Clarity, UA clear clear  ? Glucose, UA negative negative mg/dL  ? Bilirubin, UA negative negative  ? Ketones, POC UA negative negative mg/dL  ? Spec Grav, UA 1.020 1.010 - 1.025  ? Blood, UA negative negative  ? pH, UA 6.0 5.0 - 8.0  ? POC PROTEIN,UA negative negative, trace  ? Urobilinogen, UA 0.2 0.2 or 1.0 E.U./dL  ? Nitrite, UA Negative Negative  ? Leukocytes, UA Negative Negative  ? ? ? ?ASSESSMENT AND PLAN: ?1. Vaginal itching: ?- No urinary tract infection. ?- Cervicovaginal self-swab to screen for chlamydia, gonorrhea, trichomonas, bacterial vaginitis, and candida vaginitis. ?- Cervicovaginal ancillary only ?- POCT URINALYSIS DIP (CLINITEK) ? ?2. Dry mouth: ?- Referral to ENT for further evaluation and management.  ?- Ambulatory referral to ENT ? ? ?Patient was given the opportunity to ask questions.  Patient verbalized understanding of the  plan and was able to repeat key elements of the plan. Patient was given clear instructions to go to Emergency Department or return to medical center if symptoms don't improve, worsen, or new problems develop.The patient verbalized understanding. ? ? ?Orders Placed This Encounter  ?Procedures  ? Ambulatory referral to ENT  ? POCT URINALYSIS DIP (CLINITEK)  ? ? ?Follow-up with primary provider as scheduled.  ? ?08/23/21, NP  ?

## 2021-08-21 ENCOUNTER — Encounter: Payer: Self-pay | Admitting: Family

## 2021-08-21 ENCOUNTER — Ambulatory Visit (INDEPENDENT_AMBULATORY_CARE_PROVIDER_SITE_OTHER): Payer: Self-pay | Admitting: Family

## 2021-08-21 ENCOUNTER — Other Ambulatory Visit: Payer: Self-pay

## 2021-08-21 ENCOUNTER — Other Ambulatory Visit (HOSPITAL_COMMUNITY)
Admission: RE | Admit: 2021-08-21 | Discharge: 2021-08-21 | Disposition: A | Discharge status: Home / Self Care | Payer: Medicaid Other | Source: Ambulatory Visit | Attending: Family | Admitting: Family

## 2021-08-21 VITALS — BP 109/72 | HR 75 | Temp 98.3°F | Resp 18 | Ht 65.0 in | Wt 165.0 lb

## 2021-08-21 DIAGNOSIS — N898 Other specified noninflammatory disorders of vagina: Secondary | ICD-10-CM

## 2021-08-21 DIAGNOSIS — Z113 Encounter for screening for infections with a predominantly sexual mode of transmission: Secondary | ICD-10-CM | POA: Insufficient documentation

## 2021-08-21 DIAGNOSIS — R682 Dry mouth, unspecified: Secondary | ICD-10-CM

## 2021-08-21 LAB — POCT URINALYSIS DIP (CLINITEK)
Bilirubin, UA: NEGATIVE
Blood, UA: NEGATIVE
Glucose, UA: NEGATIVE mg/dL
Ketones, POC UA: NEGATIVE mg/dL
Leukocytes, UA: NEGATIVE
Nitrite, UA: NEGATIVE
POC PROTEIN,UA: NEGATIVE
Spec Grav, UA: 1.02 (ref 1.010–1.025)
Urobilinogen, UA: 0.2 E.U./dL
pH, UA: 6 (ref 5.0–8.0)

## 2021-08-21 NOTE — Progress Notes (Signed)
Call patient with results.  ? ?No urinary tract infection.

## 2021-08-21 NOTE — Progress Notes (Signed)
Pt presents for vaginal itching and discharge, denies any odor pt states has been going on for about 1 month  ?Still experiencing dry mouth ?

## 2021-08-22 LAB — CERVICOVAGINAL ANCILLARY ONLY
Bacterial Vaginitis (gardnerella): NEGATIVE
Candida Glabrata: NEGATIVE
Candida Vaginitis: NEGATIVE
Chlamydia: NEGATIVE
Comment: NEGATIVE
Comment: NEGATIVE
Comment: NEGATIVE
Comment: NEGATIVE
Comment: NEGATIVE
Comment: NORMAL
Neisseria Gonorrhea: NEGATIVE
Trichomonas: NEGATIVE

## 2021-08-22 NOTE — Progress Notes (Signed)
Call patient with update.  ? ?Gonorrhea, Chlamydia, Trichomonas, Bacterial Vaginitis, and Candida Vaginitis (sometimes called a yeast infection) negative.

## 2021-08-23 ENCOUNTER — Ambulatory Visit: Payer: Medicaid Other | Attending: Family Medicine

## 2021-08-23 ENCOUNTER — Ambulatory Visit: Payer: Medicaid Other

## 2021-08-23 ENCOUNTER — Other Ambulatory Visit: Payer: Self-pay

## 2021-08-25 ENCOUNTER — Ambulatory Visit: Payer: Medicaid Other

## 2021-10-11 DIAGNOSIS — K117 Disturbances of salivary secretion: Secondary | ICD-10-CM | POA: Insufficient documentation

## 2022-05-26 ENCOUNTER — Ambulatory Visit
Admission: EM | Admit: 2022-05-26 | Discharge: 2022-05-26 | Disposition: A | Discharge status: Home / Self Care | Payer: Medicaid Other | Attending: Family Medicine | Admitting: Family Medicine

## 2022-05-26 DIAGNOSIS — L989 Disorder of the skin and subcutaneous tissue, unspecified: Secondary | ICD-10-CM | POA: Diagnosis not present

## 2022-05-26 MED ORDER — CEPHALEXIN 500 MG PO CAPS: 500.0000 mg | ORAL_CAPSULE | Freq: Two times a day (BID) | ORAL | 0 refills | Status: DC

## 2022-05-26 MED ORDER — CHLORHEXIDINE GLUCONATE 4 % EX LIQD: Freq: Every day | CUTANEOUS | 0 refills | Status: DC | PRN

## 2022-05-26 MED ORDER — MUPIROCIN 2 % EX OINT: 1.0000 | TOPICAL_OINTMENT | Freq: Two times a day (BID) | CUTANEOUS | 0 refills | Status: DC

## 2022-05-26 NOTE — ED Provider Notes (Signed)
[facility]    CSN: [id number] Arrival date & time: 05/26/22  [id number]      History   Chief Complaint Chief Complaint  Patient presents with   Hand Problem    HPI Bridget Huffman is a 36 y.o. female.   Pt reports with a open small sore on her left hand below her ring finger. She had surgery on that finger 2 years ago but 2 week ago the stitch popped out from below that finger. She was scratching it now it is open, red, and puffy. Took ibuprofen last night but no relief. It is slightly painful.       Past Medical History:  Diagnosis Date   Hx of preeclampsia, prior pregnancy, currently pregnant    No pertinent past medical history    Pregnancy induced hypertension    previous pregnancy    Patient Active Problem List   Diagnosis Date Noted   Decreased fetal movement 01/20/2017   Perforated appendicitis 06/01/2014   Acute appendicitis 05/31/2014   Annual physical exam 02/09/2014   Pap smear for cervical cancer screening 02/09/2014   NSVD (normal spontaneous vaginal delivery) 03/29/2012   Mild preeclampsia delivered 03/29/2012   GBS (group B streptococcus) UTI complicating pregnancy [id number]    Past Surgical History:  Procedure Laterality Date   APPENDECTOMY     CESAREAN SECTION N/A 01/21/2017   Procedure: CESAREAN SECTION;  Surgeon: Jerelyn Charles, MD;  Location: [place];  Service: Obstetrics;  Laterality: N/A;   CESAREAN SECTION N/A    Phreesia 08/30/2020   LAPAROSCOPIC APPENDECTOMY N/A 05/31/2014   Procedure: APPENDECTOMY LAPAROSCOPIC;  Surgeon: Ralene Ok, MD;  Location: [place];  Service: General;  Laterality: N/A;   TENOLYSIS Left 06/15/2020   Procedure: Left ring and small finger scar excision and tenolysis of Flexor digitorum superficialis/Flexor digitorum profundus repairs;  Surgeon: Iran Planas, MD;  Location: [place];  Service: Orthopedics;  Laterality: Left;  with IV sedation needs 90 minutes    OB  History     Gravida  2   Para  1   Term  1   Preterm  0   AB  0   Living  1      SAB  0   IAB  0   Ectopic  0   Multiple  0   Live Births  1            Home Medications    Prior to Admission medications   Medication Sig Start Date End Date Taking? Authorizing Provider  cephALEXin (KEFLEX) 500 MG capsule Take 1 capsule (500 mg total) by mouth 2 (two) times daily. 05/26/22  Yes Volney American, PA-C  chlorhexidine (HIBICLENS) 4 % external liquid Apply topically daily as needed. 05/26/22  Yes Volney American, PA-C  mupirocin ointment (BACTROBAN) 2 % Apply 1 Application topically 2 (two) times daily. 05/26/22  Yes Volney American, PA-C  hydrocortisone 1 % ointment Apply 1 application topically 2 (two) times daily. 08/30/20   Camillia Herter, NP  Multiple Vitamin (MULTIVITAMIN WITH MINERALS) TABS tablet Take 1 tablet by mouth daily.    [provider]    Family History Family History  Problem Relation Age of Onset   Hypertension Mother    Alcohol abuse Neg Hx    Arthritis Neg Hx    Asthma Neg Hx    Birth defects Neg Hx    Cancer Neg Hx    COPD Neg Hx  Depression Neg Hx    Diabetes Neg Hx    Drug abuse Neg Hx    Early death Neg Hx    Hearing loss Neg Hx    Heart disease Neg Hx    Hyperlipidemia Neg Hx    Learning disabilities Neg Hx    Kidney disease Neg Hx    Mental illness Neg Hx    Mental retardation Neg Hx    Miscarriages / Stillbirths Neg Hx    Stroke Neg Hx    Vision loss Neg Hx     Social History Social History   Tobacco Use   Smoking status: Never    Passive exposure: Never   Smokeless tobacco: Never  Vaping Use   Vaping Use: Never used  Substance Use Topics   Alcohol use: No   Drug use: No     Allergies   Patient has no known allergies.   Review of Systems Review of Systems Per HPI  Physical Exam Triage Vital Signs ED Triage Vitals  Enc Vitals Group     BP 05/26/22 1012 117/75     Pulse  Rate 05/26/22 1012 83     Resp 05/26/22 1012 20     Temp 05/26/22 1012 98.9 F (37.2 C)     Temp Source 05/26/22 1012 Oral     SpO2 05/26/22 1012 98 %     Weight --      Height --      Head Circumference --      Peak Flow --      Pain Score 05/26/22 1018 4     Pain Loc --      Pain Edu? --      Excl. in GC? --    No data found.  Updated Vital Signs BP 117/75 (BP Location: Right Arm)   Pulse 83   Temp 98.9 F (37.2 C) (Oral)   Resp 20   LMP 05/16/2022 (Exact Date)   SpO2 98%   Visual Acuity Right Eye Distance:   Left Eye Distance:   Bilateral Distance:    Right Eye Near:   Left Eye Near:    Bilateral Near:     Physical Exam Vitals and nursing note reviewed.  Constitutional:      Appearance: Normal appearance. She is not ill-appearing.  HENT:     Head: Atraumatic.  Eyes:     Extraocular Movements: Extraocular movements intact.     Conjunctiva/sclera: Conjunctivae normal.  Cardiovascular:     Rate and Rhythm: Normal rate and regular rhythm.     Heart sounds: Normal heart sounds.  Pulmonary:     Effort: Pulmonary effort is normal.     Breath sounds: Normal breath sounds.  Musculoskeletal:        General: Normal range of motion.     Cervical back: Normal range of motion and neck supple.  Skin:    General: Skin is warm.     Comments: Linear scar tissue to the palmar surface of the left hand from a past surgery.  Erythematous eroded lesion to the left palm about an inch laterally from the incision site, no active drainage or bleeding currently  Neurological:     Mental Status: She is alert and oriented to person, place, and time.     Comments: Left hand neurovascularly intact  Psychiatric:        Mood and Affect: Mood normal.        Thought Content: Thought content normal.  Judgment: Judgment normal.      UC Treatments / Results  Labs (all labs ordered are listed, but only abnormal results are displayed) Labs Reviewed - No data to  display  EKG   Radiology No results found.  Procedures Procedures (including critical care time)  Medications Ordered in UC Medications - No data to display  Initial Impression / Assessment and Plan / UC Course  I have reviewed the triage vital signs and the nursing notes.  Pertinent labs & imaging results that were available during my care of the patient were reviewed by me and considered in my medical decision making (see chart for details).     Unclear if granulation tissue, new infection or other etiology but does appear to be becoming infected so we will treat with Keflex, Hibiclens, mupirocin and keep dressed until fully resolved.  Dermatology and/or PCP follow-up recommended within the next week.  Final Clinical Impressions(s) / UC Diagnoses   Final diagnoses:  Skin lesion of hand     Discharge Instructions       clean with Hibiclens and apply mupirocin ointment at least once daily. Keep area covered with nonstick gauze and Coban wrap to protect it.  Follow-up with primary care soon as possible and/or dermatology for a recheck    ED Prescriptions     Medication Sig Dispense Auth. Provider   chlorhexidine (HIBICLENS) 4 % external liquid Apply topically daily as needed. 120 mL Volney American, PA-C   mupirocin ointment (BACTROBAN) 2 % Apply 1 Application topically 2 (two) times daily. 22 g Volney American, [place]   cephALEXin (KEFLEX) 500 MG capsule Take 1 capsule (500 mg total) by mouth 2 (two) times daily. 14 capsule Volney American, [place]      PDMP not reviewed this encounter.   Volney American, [place] 05/26/22 1052

## 2022-05-26 NOTE — ED Triage Notes (Signed)
Pt reports with a open small sore on her left hand below her ring finger. She had surgery on that finger 2 years ago but 2 week ago the stitch popped out from below that finger. She was scratching it now it is open, red, and puffy. Took ibuprofen last night but no relief. It is slightly painful.

## 2022-05-26 NOTE — ED Notes (Signed)
Cleaned wound on left hand and applied non stick dressing and koban per provider orders. Taught patient how to clean wound and patient verbalized understanding.

## 2022-05-26 NOTE — Discharge Instructions (Signed)
clean with Hibiclens and apply mupirocin ointment at least once daily. Keep area covered with nonstick gauze and Coban wrap to protect it.  Follow-up with primary care soon as possible and/or dermatology for a recheck

## 2022-05-28 NOTE — Progress Notes (Unsigned)
Patient ID: Bridget Huffman, female    DOB: [birth year]  MRN: [id number]  CC: Urgent Care Follow-Up  Subjective: Bridget Huffman is a 36 y.o. female who presents for urgent care follow-up.   Her concerns today include:  05/26/2022 [facility] per PA note: Unclear if granulation tissue, new infection or other etiology but does appear to be becoming infected so we will treat with Keflex, Hibiclens, mupirocin and keep dressed until fully resolved. Dermatology and/or PCP follow-up recommended within the next week.   Discharge Instructions         clean with Hibiclens and apply mupirocin ointment at least once daily. Keep area covered with nonstick gauze and Coban wrap to protect it.  Follow-up with primary care soon as possible and/or dermatology for a recheck  Follow-Ups Schedule an appointment with [facility] (Dermatology)  Today's visit 05/29/2022: History of 2 hand surgeries two years ago by orthopedist secondary to cutting tendons of left hand with a knife while cutting an avocado. States orthopedist discussed possibility of third surgery and she declined at that time. Subsequently she had physical therapy after surgery. States mobility of left ring finger never returned to baseline (contracted). She does have full sensation of entire left hand. She is able to use hand to move/pickup things when needed. She is right-handed. Since Urgent Care visit taking antibiotic as prescribed. Cleaning left hand twice daily. Does have some drainage. Denies pain and additional symptoms.   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   Encounter for orthopedic follow-up care 12/29/2019   Laceration of left hand 12/21/2019   Pain in finger of left hand 12/15/2019   Decreased fetal movement 01/20/2017   Perforated appendicitis 06/01/2014   Acute appendicitis 05/31/2014   Annual physical exam 02/09/2014   Pap  smear for cervical cancer screening 02/09/2014   NSVD (normal spontaneous vaginal delivery) 03/29/2012   Mild preeclampsia delivered 03/29/2012   GBS (group B streptococcus) UTI complicating pregnancy 08/21/2011     Current Outpatient Medications on File Prior to Visit  Medication Sig Dispense Refill   cephALEXin (KEFLEX) 500 MG capsule Take 1 capsule (500 mg total) by mouth 2 (two) times daily. 14 capsule 0   chlorhexidine (HIBICLENS) 4 % external liquid Apply topically daily as needed. 120 mL 0   hydrocortisone 1 % ointment Apply 1 application topically 2 (two) times daily. 56 g 1   Multiple Vitamin (MULTIVITAMIN WITH MINERALS) TABS tablet Take 1 tablet by mouth daily.     mupirocin ointment (BACTROBAN) 2 % Apply 1 Application topically 2 (two) times daily. 22 g 0   oxyCODONE-acetaminophen (PERCOCET) 10-325 MG tablet Take 1 tablet every 6 hours by oral route for 5 days.     No current facility-administered medications on file prior to visit.    No Known Allergies  Social History   Socioeconomic History   Marital status: Married    Spouse name: Not on file   Number of children: Not on file   Years of education: Not on file   Highest education level: Not on file  Occupational History   Not on file  Tobacco Use   Smoking status: Never    Passive exposure: Never   Smokeless tobacco: Never  Vaping Use   Vaping Use: Never used  Substance and Sexual Activity   Alcohol use: No   Drug use: No   Sexual activity: Yes    Birth control/protection: Condom  Other Topics Concern   Not on file  Social History Narrative   Not on file   Social Determinants of Health   Financial Resource Strain: Not on file  Food Insecurity: Not on file  Transportation Needs: Not on file  Physical Activity: Not on file  Stress: Not on file  Social Connections: Not on file  Intimate Partner Violence: Not on file    Family History  Problem Relation Age of Onset   Hypertension Mother    Alcohol  abuse Neg Hx    Arthritis Neg Hx    Asthma Neg Hx    Birth defects Neg Hx    Cancer Neg Hx    COPD Neg Hx    Depression Neg Hx    Diabetes Neg Hx    Drug abuse Neg Hx    Early death Neg Hx    Hearing loss Neg Hx    Heart disease Neg Hx    Hyperlipidemia Neg Hx    Learning disabilities Neg Hx    Kidney disease Neg Hx    Mental illness Neg Hx    Mental retardation Neg Hx    Miscarriages / Stillbirths Neg Hx    Stroke Neg Hx    Vision loss Neg Hx     Past Surgical History:  Procedure Laterality Date   APPENDECTOMY     CESAREAN SECTION N/A 01/21/2017   Procedure: CESAREAN SECTION;  Surgeon: Marlow Baars, MD;  Location: [facility];  Service: Obstetrics;  Laterality: N/A;   CESAREAN SECTION N/A    Phreesia 08/30/2020   LAPAROSCOPIC APPENDECTOMY N/A 05/31/2014   Procedure: APPENDECTOMY LAPAROSCOPIC;  Surgeon: Axel Filler, MD;  Location: [facility];  Service: General;  Laterality: N/A;   TENOLYSIS Left 06/15/2020   Procedure: Left ring and small finger scar excision and tenolysis of Flexor digitorum superficialis/Flexor digitorum profundus repairs;  Surgeon: Bradly Bienenstock, MD;  Location: [facility];  Service: Orthopedics;  Laterality: Left;  with IV sedation needs 90 minutes    ROS: Review of Systems Negative except as stated above  PHYSICAL EXAM: BP 120/76 (BP Location: Left Arm, Patient Position: Sitting, Cuff Size: Normal)   Pulse 80   Temp 98.3 F (36.8 C)   Resp 16   Ht 5\' 5"  (1.651 m)   Wt 163 lb (73.9 kg)   LMP 05/16/2022 (Exact Date)   SpO2 98%   BMI 27.12 kg/m   Physical Exam Eyes:     Extraocular Movements: Extraocular movements intact.     Conjunctiva/sclera: Conjunctivae normal.     Pupils: Pupils are equal, round, and reactive to light.  Cardiovascular:     Rate and Rhythm: Normal rate and regular rhythm.     Pulses: Normal pulses.     Heart sounds: Normal heart sounds.  Pulmonary:     Effort: Pulmonary effort is normal.      Breath sounds: Normal breath sounds.  Musculoskeletal:     Right hand: Normal.     Left hand: Decreased range of motion.     Cervical back: Normal range of motion and neck supple.     Comments: Palmar aspect left hand scar tissue from prior surgical incision. There is a small opening with scant serosanguineous drainage. No additional findings.   Neurological:     General: No focal deficit present.     Mental Status: She is alert and oriented to person, place, and time.  Psychiatric:        Mood and Affect: Mood normal.  Behavior: Behavior normal.     ASSESSMENT AND PLAN: 1. History of injury of tendon 2. Laceration of left hand, foreign body presence unspecified, sequela 3. Contracture of joint of left hand 4. Decreased mobility of joint - Continue present management.  - Referral to Hand Surgery for further evaluation/management.  - Ambulatory referral to Hand Surgery  5. Skin lesion of hand - Referral to Dermatology for further evaluation/management.  - Ambulatory referral to Dermatology   Patient was given the opportunity to ask questions.  Patient verbalized understanding of the plan and was able to repeat key elements of the plan. Patient was given clear instructions to go to Emergency Department or return to medical center if symptoms don't improve, worsen, or new problems develop.The patient verbalized understanding.   Orders Placed This Encounter  Procedures   Ambulatory referral to Dermatology   Ambulatory referral to Hand Surgery    Follow-up with primary provider as scheduled.   Rema Fendt, NP

## 2022-05-29 ENCOUNTER — Ambulatory Visit (INDEPENDENT_AMBULATORY_CARE_PROVIDER_SITE_OTHER): Payer: Medicaid Other | Admitting: Family

## 2022-05-29 ENCOUNTER — Ambulatory Visit: Payer: Medicaid Other | Admitting: Family

## 2022-05-29 VITALS — BP 120/76 | HR 80 | Temp 98.3°F | Resp 16 | Ht 65.0 in | Wt 163.0 lb

## 2022-05-29 DIAGNOSIS — L989 Disorder of the skin and subcutaneous tissue, unspecified: Secondary | ICD-10-CM | POA: Diagnosis not present

## 2022-05-29 DIAGNOSIS — M24542 Contracture, left hand: Secondary | ICD-10-CM

## 2022-05-29 DIAGNOSIS — M2560 Stiffness of unspecified joint, not elsewhere classified: Secondary | ICD-10-CM

## 2022-05-29 DIAGNOSIS — Z87828 Personal history of other (healed) physical injury and trauma: Secondary | ICD-10-CM

## 2022-05-29 DIAGNOSIS — S61412S Laceration without foreign body of left hand, sequela: Secondary | ICD-10-CM

## 2022-05-29 NOTE — Progress Notes (Signed)
Pt presents for urgent care f/u -hand lesion states still bleeding

## 2022-06-16 ENCOUNTER — Encounter (HOSPITAL_COMMUNITY): Payer: Self-pay

## 2022-06-16 ENCOUNTER — Ambulatory Visit (HOSPITAL_COMMUNITY)
Admission: RE | Admit: 2022-06-16 | Discharge: 2022-06-16 | Disposition: A | Discharge status: Home / Self Care | Payer: Medicaid Other | Source: Ambulatory Visit | Attending: Emergency Medicine | Admitting: Emergency Medicine

## 2022-06-16 ENCOUNTER — Other Ambulatory Visit: Payer: Self-pay

## 2022-06-16 VITALS — BP 108/70 | HR 111 | Temp 99.3°F | Resp 18

## 2022-06-16 DIAGNOSIS — J0140 Acute pansinusitis, unspecified: Secondary | ICD-10-CM | POA: Diagnosis not present

## 2022-06-16 MED ORDER — KETOROLAC TROMETHAMINE 30 MG/ML IJ SOLN
INTRAMUSCULAR | Status: AC
  Filled 2022-06-16: qty 1

## 2022-06-16 MED ORDER — KETOROLAC TROMETHAMINE 30 MG/ML IJ SOLN
30.0000 mg | Freq: Once | INTRAMUSCULAR | Status: AC
  Administered 2022-06-16: 30 mg via INTRAMUSCULAR

## 2022-06-16 MED ORDER — AMOXICILLIN-POT CLAVULANATE 875-125 MG PO TABS: 1.0000 | ORAL_TABLET | Freq: Two times a day (BID) | ORAL | 0 refills | Status: DC

## 2022-06-16 MED ORDER — IPRATROPIUM BROMIDE 0.03 % NA SOLN: 2.0000 | Freq: Two times a day (BID) | NASAL | 12 refills | Status: DC

## 2022-06-16 NOTE — ED Triage Notes (Signed)
Pt reports having bil ear pain with congestion,sore throat and HA for 11/2 weeks.

## 2022-06-16 NOTE — Discharge Instructions (Signed)
Today you are being treated for a sinus infection  Begin Augmentin every morning and every evening for 7 days, ideally start to see improvement in about 48 hours  Begin use of nasal spray every morning and every evening to help clear the sinuses and reduce pressure which will help with your ear pain  Today you have been given an injection of Toradol here in the office to help reduce pain, ideally you will have some relief in about 30 minutes    You can take Tylenol and/or Ibuprofen as needed for fever reduction and pain relief.   For cough: honey 1/2 to 1 teaspoon (you can dilute the honey in water or another fluid).  You can also use guaifenesin and dextromethorphan for cough. You can use a humidifier for chest congestion and cough.  If you don't have a humidifier, you can sit in the bathroom with the hot shower running.      For sore throat: try warm salt water gargles, cepacol lozenges, throat spray, warm tea or water with lemon/honey, popsicles or ice, or OTC cold relief medicine for throat discomfort.   For congestion: take a daily anti-histamine like Zyrtec, Claritin, and a oral decongestant, such as pseudoephedrine.  You can also use Flonase 1-2 sprays in each nostril daily.   It is important to stay hydrated: drink plenty of fluids (water, gatorade/powerade/pedialyte, juices, or teas) to keep your throat moisturized and help further relieve irritation/discomfort.

## 2022-06-16 NOTE — ED Provider Notes (Signed)
[facility]    CSN: [id number] Arrival date & time: 06/16/22  1312      History   Chief Complaint Chief Complaint  Patient presents with   Sore Throat   Headache   Nasal Congestion   Otalgia    HPI Bridget Huffman is a 37 y.o. female.   Patient presents for evaluation of nasal congestion, rhinorrhea, bilateral ear pain, sinus pain and pressure, sore throat, cough, body aches and headaches present for 10 days.  No known sick contacts.  Tolerating food and liquids.  Has attempted use of 2 amoxicillin to tablets, Tylenol and Advil without any relief.  Denies fevers, shortness of breath or wheezing.  No pertinent medical history. Past Medical History:  Diagnosis Date   Hx of preeclampsia, prior pregnancy, currently pregnant    No pertinent past medical history    Pregnancy induced hypertension    previous pregnancy    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   Encounter for orthopedic follow-up care 12/29/2019   Laceration of left hand 12/21/2019   Pain in finger of left hand 12/15/2019   Decreased fetal movement 01/20/2017   Perforated appendicitis 06/01/2014   Acute appendicitis 05/31/2014   Annual physical exam 02/09/2014   Pap smear for cervical cancer screening 02/09/2014   NSVD (normal spontaneous vaginal delivery) 03/29/2012   Mild preeclampsia delivered 03/29/2012   GBS (group B streptococcus) UTI complicating pregnancy 08/21/2011    Past Surgical History:  Procedure Laterality Date   APPENDECTOMY     CESAREAN SECTION N/A 01/21/2017   Procedure: CESAREAN SECTION;  Surgeon: Marlow Baars, MD;  Location: [facility];  Service: Obstetrics;  Laterality: N/A;   CESAREAN SECTION N/A    Phreesia 08/30/2020   LAPAROSCOPIC APPENDECTOMY N/A 05/31/2014   Procedure: APPENDECTOMY LAPAROSCOPIC;  Surgeon: Axel Filler, MD;  Location: [facility];  Service: General;  Laterality: N/A;   TENOLYSIS Left 06/15/2020    Procedure: Left ring and small finger scar excision and tenolysis of Flexor digitorum superficialis/Flexor digitorum profundus repairs;  Surgeon: Bradly Bienenstock, MD;  Location: [facility];  Service: Orthopedics;  Laterality: Left;  with IV sedation needs 90 minutes    OB History     Gravida  2   Para  1   Term  1   Preterm  0   AB  0   Living  1      SAB  0   IAB  0   Ectopic  0   Multiple  0   Live Births  1            Home Medications    Prior to Admission medications   Medication Sig Start Date End Date Taking? Authorizing Provider  cephALEXin (KEFLEX) 500 MG capsule Take 1 capsule (500 mg total) by mouth 2 (two) times daily. 05/26/22   Particia Nearing, PA-C  chlorhexidine (HIBICLENS) 4 % external liquid Apply topically daily as needed. 05/26/22   Particia Nearing, PA-C  hydrocortisone 1 % ointment Apply 1 application topically 2 (two) times daily. 08/30/20   Rema Fendt, NP  Multiple Vitamin (MULTIVITAMIN WITH MINERALS) TABS tablet Take 1 tablet by mouth daily.    [provider]  mupirocin ointment (BACTROBAN) 2 % Apply 1 Application topically 2 (two) times daily. 05/26/22   Particia Nearing, PA-C  oxyCODONE-acetaminophen (PERCOCET) 10-325 MG tablet Take 1 tablet every 6 hours by oral route for 5 days.  [provider]    Family History Family History  Problem Relation Age of Onset   Hypertension Mother    Alcohol abuse Neg Hx    Arthritis Neg Hx    Asthma Neg Hx    Birth defects Neg Hx    Cancer Neg Hx    COPD Neg Hx    Depression Neg Hx    Diabetes Neg Hx    Drug abuse Neg Hx    Early death Neg Hx    Hearing loss Neg Hx    Heart disease Neg Hx    Hyperlipidemia Neg Hx    Learning disabilities Neg Hx    Kidney disease Neg Hx    Mental illness Neg Hx    Mental retardation Neg Hx    Miscarriages / Stillbirths Neg Hx    Stroke Neg Hx    Vision loss Neg Hx     Social History Social  History   Tobacco Use   Smoking status: Never    Passive exposure: Never   Smokeless tobacco: Never  Vaping Use   Vaping Use: Never used  Substance Use Topics   Alcohol use: No   Drug use: No     Allergies   Patient has no known allergies.   Review of Systems Review of Systems  Constitutional: Negative.   HENT:  Positive for congestion, ear pain, rhinorrhea and sore throat. Negative for dental problem, drooling, ear discharge, facial swelling, hearing loss, mouth sores, nosebleeds, postnasal drip, sinus pressure, sinus pain, sneezing, tinnitus, trouble swallowing and voice change.   Respiratory:  Positive for cough. Negative for apnea, choking, chest tightness, shortness of breath, wheezing and stridor.   Cardiovascular: Negative.   Gastrointestinal: Negative.   Musculoskeletal:  Positive for myalgias. Negative for arthralgias, back pain, gait problem, joint swelling, neck pain and neck stiffness.  Skin: Negative.   Neurological:  Positive for headaches. Negative for dizziness, tremors, seizures, syncope, facial asymmetry, speech difficulty, weakness, light-headedness and numbness.     Physical Exam Triage Vital Signs ED Triage Vitals  Enc Vitals Group     BP 06/16/22 1325 108/70     Pulse Rate 06/16/22 1325 (!) 111     Resp 06/16/22 1325 18     Temp 06/16/22 1325 99.3 F (37.4 C)     Temp src --      SpO2 06/16/22 1325 95 %     Weight --      Height --      Head Circumference --      Peak Flow --      Pain Score 06/16/22 1324 8     Pain Loc --      Pain Edu? --      Excl. in [place]? --    No data found.  Updated Vital Signs BP 108/70   Pulse (!) 111   Temp 99.3 F (37.4 C)   Resp 18   LMP 05/16/2022 (Exact Date)   SpO2 95%   Visual Acuity Right Eye Distance:   Left Eye Distance:   Bilateral Distance:    Right Eye Near:   Left Eye Near:    Bilateral Near:     Physical Exam Constitutional:      Appearance: Normal appearance.  HENT:     Head:  Normocephalic.     Right Ear: Tympanic membrane, ear canal and external ear normal.     Left Ear: Tympanic membrane, ear canal and external ear normal.     Nose:  Congestion and rhinorrhea present.     Right Sinus: Maxillary sinus tenderness and frontal sinus tenderness present.     Left Sinus: Maxillary sinus tenderness and frontal sinus tenderness present.     Mouth/Throat:     Mouth: Mucous membranes are moist.     Pharynx: No posterior oropharyngeal erythema.  Eyes:     Extraocular Movements: Extraocular movements intact.  Cardiovascular:     Rate and Rhythm: Normal rate and regular rhythm.     Pulses: Normal pulses.     Heart sounds: Normal heart sounds.  Pulmonary:     Effort: Pulmonary effort is normal.     Breath sounds: Normal breath sounds.  Musculoskeletal:     Cervical back: Normal range of motion and neck supple.  Lymphadenopathy:     Cervical: Cervical adenopathy present.  Skin:    General: Skin is warm and dry.  Neurological:     Mental Status: She is alert and oriented to person, place, and time. Mental status is at baseline.  Psychiatric:        Mood and Affect: Mood normal.        Behavior: Behavior normal.      UC Treatments / Results  Labs (all labs ordered are listed, but only abnormal results are displayed) Labs Reviewed - No data to display  EKG   Radiology No results found.  Procedures Procedures (including critical care time)  Medications Ordered in UC Medications - No data to display  Initial Impression / Assessment and Plan / UC Course  I have reviewed the triage vital signs and the nursing notes.  Pertinent labs & imaging results that were available during my care of the patient were reviewed by me and considered in my medical decision making (see chart for details).  Acute nonrecurrent pansinusitis  Presentation and symptomology is consistent with a sinusitis and as symptoms have been present for 10 days we will provide bacterial  coverage, vitals are stable and patient is in no signs of distress nontoxic-appearing, Augmentin prescribed as well as ipratropium nasal spray, Toradol injection given in office for management of body aches, recommended continued use of over-the-counter medications for further support, may follow-up with his urgent care if symptoms persist or worsen Final Clinical Impressions(s) / UC Diagnoses   Final diagnoses:  None   Discharge Instructions   None    ED Prescriptions   None    PDMP not reviewed this encounter.   Valinda Hoar, NP 06/16/22 1358

## 2022-07-31 HISTORY — PX: RECONSTRUCTION TENDON PULLEY HAND: SUR1090

## 2022-08-25 ENCOUNTER — Encounter (HOSPITAL_COMMUNITY): Payer: Self-pay

## 2022-08-25 ENCOUNTER — Ambulatory Visit (HOSPITAL_COMMUNITY)
Admission: RE | Admit: 2022-08-25 | Discharge: 2022-08-25 | Disposition: A | Discharge status: Home / Self Care | Payer: Medicaid Other | Source: Ambulatory Visit | Attending: Family | Admitting: Family

## 2022-08-25 VITALS — BP 112/70 | HR 79 | Temp 99.2°F | Resp 16 | Ht 65.0 in | Wt 175.0 lb

## 2022-08-25 DIAGNOSIS — S66821A Laceration of other specified muscles, fascia and tendons at wrist and hand level, right hand, initial encounter: Secondary | ICD-10-CM

## 2022-08-25 DIAGNOSIS — S66822A Laceration of other specified muscles, fascia and tendons at wrist and hand level, left hand, initial encounter: Secondary | ICD-10-CM

## 2022-08-25 DIAGNOSIS — Z4802 Encounter for removal of sutures: Secondary | ICD-10-CM

## 2022-08-25 DIAGNOSIS — S66802A Unspecified injury of other specified muscles, fascia and tendons at wrist and hand level, left hand, initial encounter: Secondary | ICD-10-CM

## 2022-08-25 NOTE — ED Provider Notes (Addendum)
[facility]    CSN: [id number] Arrival date & time: 08/25/22  1426      History   Chief Complaint Chief Complaint  Patient presents with   Appointment    NEEDS TO BE SEEN   Suture / Staple Removal    HPI Bridget Huffman is a 37 y.o. female.   Patient presents for evaluation for suture removal for the left hand.  Completed tendon flexor surgery in [place] approximately 1 month ago, has show letter from physician requesting suture removal.  40 sutures counted .     Past Medical History:  Diagnosis Date   Hx of preeclampsia, prior pregnancy, currently pregnant    No pertinent past medical history    Pregnancy induced hypertension    previous pregnancy    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   Encounter for orthopedic follow-up care 12/29/2019   Laceration of left hand 12/21/2019   Pain in finger of left hand 12/15/2019   Decreased fetal movement 01/20/2017   Perforated appendicitis 06/01/2014   Acute appendicitis 05/31/2014   Annual physical exam 02/09/2014   Pap smear for cervical cancer screening 02/09/2014   NSVD (normal spontaneous vaginal delivery) 03/29/2012   Mild preeclampsia delivered 03/29/2012   GBS (group B streptococcus) UTI complicating pregnancy 08/21/2011    Past Surgical History:  Procedure Laterality Date   APPENDECTOMY     CESAREAN SECTION N/A 01/21/2017   Procedure: CESAREAN SECTION;  Surgeon: Marlow Baars, MD;  Location: [facility];  Service: Obstetrics;  Laterality: N/A;   CESAREAN SECTION N/A    [employer]reesia 08/30/2020   LAPAROSCOPIC APPENDECTOMY N/A 05/31/2014   Procedure: APPENDECTOMY LAPAROSCOPIC;  Surgeon: Axel Filler, MD;  Location: [facility];  Service: General;  Laterality: N/A;   RECONSTRUCTION TENDON PULLEY HAND Left 07/31/2022   TENOLYSIS Left 06/15/2020   Procedure: Left ring and small finger scar excision and tenolysis of Flexor digitorum superficialis/Flexor digitorum  profundus repairs;  Surgeon: Bradly Bienenstock, MD;  Location: [facility];  Service: Orthopedics;  Laterality: Left;  with IV sedation needs 90 minutes    OB History     Gravida  2   Para  1   Term  1   Preterm  0   AB  0   Living  1      SAB  0   IAB  0   Ectopic  0   Multiple  0   Live Births  1            Home Medications    Prior to Admission medications   Medication Sig Start Date End Date Taking? Authorizing Provider  Multiple Vitamin (MULTIVITAMIN WITH MINERALS) TABS tablet Take 1 tablet by mouth daily.   Yes [provider]  amoxicillin-clavulanate (AUGMENTIN) 875-125 MG tablet Take 1 tablet by mouth every 12 (twelve) hours. 06/16/22   Derl Abalos, Elita Boone, NP  cephALEXin (KEFLEX) 500 MG capsule Take 1 capsule (500 mg total) by mouth 2 (two) times daily. 05/26/22   Particia Nearing, PA-C  chlorhexidine (HIBICLENS) 4 % external liquid Apply topically daily as needed. 05/26/22   Particia Nearing, PA-C  hydrocortisone 1 % ointment Apply 1 application topically 2 (two) times daily. 08/30/20   Rema Fendt, NP  ipratropium (ATROVENT) 0.03 % nasal spray Place 2 sprays into both nostrils every 12 (twelve) hours. 06/16/22   Valinda Hoar, NP  mupirocin ointment (BACTROBAN) 2 % Apply 1 Application topically  2 (two) times daily. 05/26/22   Particia Nearing, PA-C  oxyCODONE-acetaminophen (PERCOCET) 10-325 MG tablet Take 1 tablet every 6 hours by oral route for 5 days.    [provider]    Family History Family History  Problem Relation Age of Onset   Hypertension Mother    Alcohol abuse Neg Hx    Arthritis Neg Hx    Asthma Neg Hx    Birth defects Neg Hx    Cancer Neg Hx    COPD Neg Hx    Depression Neg Hx    Diabetes Neg Hx    Drug abuse Neg Hx    Early death Neg Hx    Hearing loss Neg Hx    Heart disease Neg Hx    Hyperlipidemia Neg Hx    Learning disabilities Neg Hx    Kidney disease Neg Hx    Mental  illness Neg Hx    Mental retardation Neg Hx    Miscarriages / Stillbirths Neg Hx    Stroke Neg Hx    Vision loss Neg Hx     Social History Social History   Tobacco Use   Smoking status: Never    Passive exposure: Never   Smokeless tobacco: Never  Vaping Use   Vaping Use: Never used  Substance Use Topics   Alcohol use: No   Drug use: No     Allergies   Patient has no known allergies.   Review of Systems Review of Systems   Physical Exam Triage Vital Signs ED Triage Vitals  Enc Vitals Group     BP 08/25/22 1458 112/70     Pulse Rate 08/25/22 1458 79     Resp 08/25/22 1458 16     Temp 08/25/22 1458 99.2 F (37.3 C)     Temp Source 08/25/22 1458 Oral     SpO2 08/25/22 1458 98 %     Weight 08/25/22 1457 175 lb (79.4 kg)     Height 08/25/22 1457 5\' 5"  (1.651 m)     Head Circumference --      Peak Flow --      Pain Score 08/25/22 1455 0     Pain Loc --      Pain Edu? --      Excl. in [place]? --    No data found.  Updated Vital Signs BP 112/70 (BP Location: Right Arm)   Pulse 79   Temp 99.2 F (37.3 C) (Oral)   Resp 16   Ht 5\' 5"  (1.651 m)   Wt 175 lb (79.4 kg)   LMP 07/15/2022 (Exact Date)   SpO2 98%   BMI 29.12 kg/m   Visual Acuity Right Eye Distance:   Left Eye Distance:   Bilateral Distance:    Right Eye Near:   Left Eye Near:    Bilateral Near:     Physical Exam Constitutional:      Appearance: Normal appearance.  Eyes:     Extraocular Movements: Extraocular movements intact.  Pulmonary:     Effort: Pulmonary effort is normal.  Skin:    Comments: Incision present to the flexor of the left hand, 40 single sutures in place, no drainage, tenderness, swelling or erythema present  Neurological:     Mental Status: She is alert and oriented to person, place, and time. Mental status is at baseline.      UC Treatments / Results  Labs (all labs ordered are listed, but only abnormal results are displayed) Labs Reviewed -  No data to  display  EKG   Radiology No results found.  Procedures Procedures (including critical care time)  Medications Ordered in UC Medications - No data to display  Initial Impression / Assessment and Plan / UC Course  I have reviewed the triage vital signs and the nursing notes.  Pertinent labs & imaging results that were available during my care of the patient were reviewed by me and considered in my medical decision making (see chart for details).  Laceration of other muscle, fascia and tendons at wrist and hand, right, initial encounter,  injury of flexor tendon of left hand, initial encounter, visit for suture removal  No signs of infection, nursing staff removed sutures, given walking referral to orthopedics for any further concerns Final Clinical Impressions(s) / UC Diagnoses   Final diagnoses:  None   Discharge Instructions   None    ED Prescriptions   None    PDMP not reviewed this encounter.   Valinda Hoar, NP 08/25/22 1533    Valinda Hoar, [place] 08/30/22 [phone]

## 2022-08-25 NOTE — ED Triage Notes (Signed)
Patient had stiches placed in the left hand while in [place]. Had a pulley and tendon repair surgery. Placed 07/31/22.

## 2022-08-25 NOTE — ED Notes (Signed)
42 sutures removed from the left palm and base of the ring finger. Patient tolerated well.

## 2022-08-25 NOTE — Discharge Instructions (Addendum)
No signs of infection present    sutures removed  You have been given information to orthopedics for any further concerns regarding your hand please follow-up within, information is on front page

## 2023-03-05 ENCOUNTER — Ambulatory Visit: Payer: Self-pay

## 2023-03-05 ENCOUNTER — Encounter: Payer: Self-pay | Admitting: Family

## 2023-03-05 ENCOUNTER — Encounter: Payer: Self-pay | Admitting: Neurology

## 2023-03-05 ENCOUNTER — Ambulatory Visit (INDEPENDENT_AMBULATORY_CARE_PROVIDER_SITE_OTHER): Payer: Medicaid Other | Admitting: Family

## 2023-03-05 VITALS — BP 108/74 | HR 86 | Temp 98.2°F | Ht 69.0 in | Wt 176.8 lb

## 2023-03-05 DIAGNOSIS — L659 Nonscarring hair loss, unspecified: Secondary | ICD-10-CM | POA: Diagnosis not present

## 2023-03-05 DIAGNOSIS — Z9882 Breast implant status: Secondary | ICD-10-CM

## 2023-03-05 DIAGNOSIS — G43909 Migraine, unspecified, not intractable, without status migrainosus: Secondary | ICD-10-CM

## 2023-03-05 MED ORDER — SUMATRIPTAN SUCCINATE 25 MG PO TABS: ORAL_TABLET | ORAL | 1 refills | Status: DC

## 2023-03-05 MED ORDER — TOPIRAMATE 25 MG PO TABS: 25.0000 mg | ORAL_TABLET | Freq: Every day | ORAL | 1 refills | Status: DC

## 2023-03-05 NOTE — Telephone Encounter (Signed)
  Chief Complaint: HA for 1 month Symptoms: HA on sides of head. Gets better during the day, but does not resolve. Pt has not taken any HA medication. Pt cannot sleep and has taken OTC sleep medication. Frequency: 1 month Pertinent Negatives: Patient denies  Disposition: [] ED /[] Urgent Care (no appt availability in office) / [x] Appointment(In office/virtual)/ []  [facility]/ [] Home Care/ [] Refused Recommended Disposition /[] [facility]/ []  Follow-up with PCP Additional Notes: Pt states she is very tired from not sleeping.    Reason for Disposition  [1] MODERATE headache (e.g., interferes with normal activities) AND [2] present > 24 hours AND [3] unexplained  (Exceptions: analgesics not tried, typical migraine, or headache part of viral illness)  Answer Assessment - Initial Assessment Questions 1. LOCATION: "Where does it hurt?"      Both sides 2. ONSET: "When did the headache start?" (Minutes, hours or days)      1 month 3. PATTERN: "Does the pain come and go, or has it been constant since it started?"     constant 4. SEVERITY: "How bad is the pain?" and "What does it keep you from doing?"  (e.g., Scale 1-10; mild, moderate, or severe)   - MILD (1-3): doesn't interfere with normal activities    - MODERATE (4-7): interferes with normal activities or awakens from sleep    - SEVERE (8-10): excruciating pain, unable to do any normal activities        Mornings 7/10 - during the day gets better 5. RECURRENT SYMPTOM: "Have you ever had headaches before?" If Yes, ask: "When was the last time?" and "What happened that time?"      no 6. CAUSE: "What do you think is causing the headache?"     no 7. MIGRAINE: "Have you been diagnosed with migraine headaches?" If Yes, ask: "Is this headache similar?"      no 8. HEAD INJURY: "Has there been any recent injury to the head?"      no 9. OTHER SYMPTOMS: "Do you have any other symptoms?" (fever, stiff neck, eye pain, sore throat,  cold symptoms)     Unable to sleep, no energy  Protocols used: Headache-A-AH

## 2023-03-05 NOTE — Progress Notes (Signed)
Patient ID: Bridget Huffman, female    DOB: 1986/01/06  MRN: [id number]  CC: Headaches  Subjective: Bridget Huffman is a 37 y.o. female who presents for headaches.   Her concerns today include:  - Daily headaches for 1 month and causing insomnia. She denies red flag symptoms. Taking over-the-counter medications with minimal relief. She would like to trial headache medication and referral to specialist.  - Hair loss. - Reports history of bilateral breast implants 10 years ago. States recently bilateral breasts "feel stiff". She would like referral to specialist.   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   Encounter for orthopedic follow-up care 12/29/2019   Laceration of left hand 12/21/2019   Pain in finger of left hand 12/15/2019   Decreased fetal movement 01/20/2017   Perforated appendicitis 06/01/2014   Acute appendicitis 05/31/2014   Annual physical exam 02/09/2014   Pap smear for cervical cancer screening 02/09/2014   NSVD (normal spontaneous vaginal delivery) 03/29/2012   Mild preeclampsia delivered 03/29/2012   GBS (group B streptococcus) UTI complicating pregnancy 08/21/2011     Current Outpatient Medications on File Prior to Visit  Medication Sig Dispense Refill   Multiple Vitamin (MULTIVITAMIN WITH MINERALS) TABS tablet Take 1 tablet by mouth daily.     No current facility-administered medications on file prior to visit.    No Known Allergies  Social History   Socioeconomic History   Marital status: Married    Spouse name: Not on file   Number of children: Not on file   Years of education: Not on file   Highest education level: Not on file  Occupational History   Not on file  Tobacco Use   Smoking status: Never    Passive exposure: Never   Smokeless tobacco: Never  Vaping Use   Vaping status: Never Used  Substance and Sexual Activity   Alcohol use: No   Drug use: No   Sexual activity: Yes    Birth  control/protection: Condom  Other Topics Concern   Not on file  Social History Narrative   Not on file   Social Determinants of Health   Financial Resource Strain: Not on file  Food Insecurity: Not on file  Transportation Needs: Not on file  Physical Activity: Not on file  Stress: Not on file  Social Connections: Not on file  Intimate Partner Violence: Not on file    Family History  Problem Relation Age of Onset   Hypertension Mother    Alcohol abuse Neg Hx    Arthritis Neg Hx    Asthma Neg Hx    Birth defects Neg Hx    Cancer Neg Hx    COPD Neg Hx    Depression Neg Hx    Diabetes Neg Hx    Drug abuse Neg Hx    Early death Neg Hx    Hearing loss Neg Hx    Heart disease Neg Hx    Hyperlipidemia Neg Hx    Learning disabilities Neg Hx    Kidney disease Neg Hx    Mental illness Neg Hx    Mental retardation Neg Hx    Miscarriages / Stillbirths Neg Hx    Stroke Neg Hx    Vision loss Neg Hx     Past Surgical History:  Procedure Laterality Date   APPENDECTOMY     CESAREAN SECTION N/A 01/21/2017   Procedure: CESAREAN SECTION;  Surgeon: Marlow Baars, MD;  Location: [facility];  Service: Obstetrics;  Laterality: N/A;   CESAREAN SECTION N/A    [employer]reesia 08/30/2020   LAPAROSCOPIC APPENDECTOMY N/A 05/31/2014   Procedure: APPENDECTOMY LAPAROSCOPIC;  Surgeon: Axel Filler, MD;  Location: [facility];  Service: General;  Laterality: N/A;   RECONSTRUCTION TENDON PULLEY HAND Left 07/31/2022   TENOLYSIS Left 06/15/2020   Procedure: Left ring and small finger scar excision and tenolysis of Flexor digitorum superficialis/Flexor digitorum profundus repairs;  Surgeon: Bradly Bienenstock, MD;  Location: [facility];  Service: Orthopedics;  Laterality: Left;  with IV sedation needs 90 minutes    ROS: Review of Systems Negative except as stated above  PHYSICAL EXAM: BP 108/74   Pulse 86   Temp 98.2 F (36.8 C) (Oral)   Ht 5\' 9"  (1.753 m)   Wt 176 lb 12.8 oz (80.2  kg)   LMP 02/21/2023 (Exact Date)   SpO2 97%   BMI 26.11 kg/m   Physical Exam HENT:     Head: Normocephalic and atraumatic.     Nose: Nose normal.     Mouth/Throat:     Mouth: Mucous membranes are moist.     Pharynx: Oropharynx is clear.  Eyes:     Extraocular Movements: Extraocular movements intact.     Conjunctiva/sclera: Conjunctivae normal.     Pupils: Pupils are equal, round, and reactive to light.  Cardiovascular:     Rate and Rhythm: Normal rate and regular rhythm.     Pulses: Normal pulses.     Heart sounds: Normal heart sounds.  Pulmonary:     Effort: Pulmonary effort is normal.     Breath sounds: Normal breath sounds.  Musculoskeletal:        General: Normal range of motion.     Cervical back: Normal range of motion and neck supple.  Neurological:     General: No focal deficit present.     Mental Status: She is alert and oriented to person, place, and time.  Psychiatric:        Mood and Affect: Mood normal.        Behavior: Behavior normal.     ASSESSMENT AND PLAN: 1. Migraine without status migrainosus, not intractable, unspecified migraine type - Topiramate and Sumatriptan as prescribed. Counseled on medication adherence/adverse effects.  - Referral to Neurology for evaluation/management. During the interim follow-up with primary provider in 4 weeks or sooner if needed until established with referral. - topiramate (TOPAMAX) 25 MG tablet; Take 1 tablet (25 mg total) by mouth at bedtime.  Dispense: 30 tablet; Refill: 1 - SUMAtriptan (IMITREX) 25 MG tablet; Take 25 mg (1 tablet total) by mouth at the start of the headache. May repeat in 2 hours x 1 if headache persists. Max of 2 tablets/24 hours.  Dispense: 30 tablet; Refill: 1 - Ambulatory referral to Neurology  2. Hair loss - Routine screening.  - CBC - TSH - Vitamin D, 25-hydroxy  3. History of bilateral breast implants - Referral to Plastic Surgery for evaluation/management.  - Ambulatory referral to  Plastic Surgery    Patient was given the opportunity to ask questions.  Patient verbalized understanding of the plan and was able to repeat key elements of the plan. Patient was given clear instructions to go to Emergency Department or return to medical center if symptoms don't improve, worsen, or new problems develop.The patient verbalized understanding.   Orders Placed This Encounter  Procedures   CBC   TSH   Vitamin D, 25-hydroxy   Ambulatory referral to Neurology  Ambulatory referral to Plastic Surgery     Requested Prescriptions   Signed Prescriptions Disp Refills   topiramate (TOPAMAX) 25 MG tablet 30 tablet 1    Sig: Take 1 tablet (25 mg total) by mouth at bedtime.   SUMAtriptan (IMITREX) 25 MG tablet 30 tablet 1    Sig: Take 25 mg (1 tablet total) by mouth at the start of the headache. May repeat in 2 hours x 1 if headache persists. Max of 2 tablets/24 hours.    Rema Fendt, NP

## 2023-03-05 NOTE — Progress Notes (Signed)
Patient states she wants to talk about head aches and pain.

## 2023-03-06 LAB — CBC
Hematocrit: 42.1 % (ref 34.0–46.6)
Hemoglobin: 13.8 g/dL (ref 11.1–15.9)
MCH: 30 pg (ref 26.6–33.0)
MCHC: 32.8 g/dL (ref 31.5–35.7)
MCV: 92 fL (ref 79–97)
Platelets: 229 10*3/uL (ref 150–450)
RBC: 4.6 x10E6/uL (ref 3.77–5.28)
RDW: 11.8 % (ref 11.7–15.4)
WBC: 6.5 10*3/uL (ref 3.4–10.8)

## 2023-03-06 LAB — TSH: TSH: 1.42 u[IU]/mL (ref 0.450–4.500)

## 2023-03-06 LAB — VITAMIN D 25 HYDROXY (VIT D DEFICIENCY, FRACTURES): Vit D, 25-Hydroxy: 38.8 ng/mL (ref 30.0–100.0)

## 2023-04-03 ENCOUNTER — Encounter: Payer: Self-pay | Admitting: Plastic Surgery

## 2023-04-03 ENCOUNTER — Ambulatory Visit: Payer: Medicaid Other | Admitting: Plastic Surgery

## 2023-04-03 VITALS — BP 121/81 | HR 81 | Ht 67.0 in | Wt 177.4 lb

## 2023-04-03 DIAGNOSIS — N644 Mastodynia: Secondary | ICD-10-CM

## 2023-04-03 DIAGNOSIS — Z9882 Breast implant status: Secondary | ICD-10-CM

## 2023-04-03 NOTE — Progress Notes (Signed)
Referring Provider Rema Fendt, NP [address] [place],  [place] [address]   CC:  Chief Complaint  Patient presents with   Consult      Bridget Huffman is an 37 y.o. female.  HPI: Bridget Huffman is a 37 year old female who underwent breast augmentation 17 years ago.  Patient has developed some discomfort on the lateral portion of each breast.  She also complains of xerostomia and hair loss.  She presents today for discussion of whether her breast implants are causing these issues.  She is also worried that she needs to have her breast implants replaced.  No Known Allergies  Outpatient Encounter Medications as of 04/03/2023  Medication Sig   Multiple Vitamin (MULTIVITAMIN WITH MINERALS) TABS tablet Take 1 tablet by mouth daily.   SUMAtriptan (IMITREX) 25 MG tablet Take 25 mg (1 tablet total) by mouth at the start of the headache. May repeat in 2 hours x 1 if headache persists. Max of 2 tablets/24 hours.   topiramate (TOPAMAX) 25 MG tablet Take 1 tablet (25 mg total) by mouth at bedtime.   No facility-administered encounter medications on file as of 04/03/2023.     Past Medical History:  Diagnosis Date   Hx of preeclampsia, prior pregnancy, currently pregnant    No pertinent past medical history    Pregnancy induced hypertension    previous pregnancy    Past Surgical History:  Procedure Laterality Date   APPENDECTOMY     CESAREAN SECTION N/A 01/21/2017   Procedure: CESAREAN SECTION;  Surgeon: Marlow Baars, MD;  Location: [facility];  Service: Obstetrics;  Laterality: N/A;   CESAREAN SECTION N/A    Phreesia 08/30/2020   LAPAROSCOPIC APPENDECTOMY N/A 05/31/2014   Procedure: APPENDECTOMY LAPAROSCOPIC;  Surgeon: Axel Filler, MD;  Location: [facility] OR;  Service: General;  Laterality: N/A;   RECONSTRUCTION TENDON PULLEY HAND Left 07/31/2022   TENOLYSIS Left 06/15/2020   Procedure: Left ring and small finger scar excision and tenolysis of  Flexor digitorum superficialis/Flexor digitorum profundus repairs;  Surgeon: Bradly Bienenstock, MD;  Location: [facility];  Service: Orthopedics;  Laterality: Left;  with IV sedation needs 90 minutes    Family History  Problem Relation Age of Onset   Hypertension Mother    Alcohol abuse Neg Hx    Arthritis Neg Hx    Asthma Neg Hx    Birth defects Neg Hx    Cancer Neg Hx    COPD Neg Hx    Depression Neg Hx    Diabetes Neg Hx    Drug abuse Neg Hx    Early death Neg Hx    Hearing loss Neg Hx    Heart disease Neg Hx    Hyperlipidemia Neg Hx    Learning disabilities Neg Hx    Kidney disease Neg Hx    Mental illness Neg Hx    Mental retardation Neg Hx    Miscarriages / Stillbirths Neg Hx    Stroke Neg Hx    Vision loss Neg Hx     Social History   Social History Narrative   Not on file     Review of Systems General: Denies fevers, chills, weight loss CV: Denies chest pain, shortness of breath, palpitations Breast: Bilateral breast implants.  Patient states that these are silicone implants  Physical Exam    04/03/2023    3:10 PM 03/05/2023    9:46 AM 08/25/2022    2:58 PM  Vitals with BMI  Height 5\' 7"  5\' 9"    Weight 177 lbs 6 oz 176 lbs 13 oz   BMI 27.78 26.1   Systolic 121 108 161  Diastolic 81 74 70  Pulse 81 86 79    General:  No acute distress,  Alert and oriented, Non-Toxic, Normal speech and affect Breast: Patient has palpable breast implants in place bilaterally.  There is minimal capsular contracture.  No dominant masses are appreciated on physical exam. Mammogram: Patient has not begun mammographic screening.  Another concern is that the implants will interfere with her mammograms. Assessment/Plan Breast implants: I had a long discussion with Bridget Huffman regarding her breast implants.  We discussed the fact that there is no scientific evidence that breast implant illness exists.  I did admit that there are some patients who feel that they have  improved their constitutional symptoms once the implants were removed.  She ask about removal of the capsule.  I told her that I do not remove capsules for medical reasons however I do remove a portion or all of the capsule to allow for wound healing without retained seroma.  The patient was concerned because her implants are older than 10 years that they needed to be replaced.  We discussed the fact that implants do not need to be replaced at 10 years or anytime in particular if the implants are intact and not causing issues.  We discussed the fact that the 10-year number comes from the warrantee that most implant manufacturers have on their implants.  I told her that the recommendations by both the FDA and implant manufacturers are that silicone implants be interrogated with MRI at 5 years after implantation and every 2 to 3 years after that.  She is requested that I order an MRI for evaluation of her implants.  She will return after the MRI is done to discuss whether she would like to have the implants removed, removed and replaced, or removed with possible mastopexy or fat grafting in the future.  All questions were answered today to her satisfaction.  30 minutes was spent examining the patient and discussing her concerns as well as coordinating her care.  Santiago Glad 04/03/2023, 4:45 PM

## 2023-04-11 ENCOUNTER — Telehealth: Payer: Self-pay

## 2023-04-11 NOTE — Telephone Encounter (Signed)
Received denial request for [id number] [phone] due to not meeting underwriting requirements due to place of performance was at an outpatient hospital [facility]) This can be changed by doing a reconsideration. Also, it was denied due to the results of the exam must be uncertain for a rupture of breast implants. Can too be be done as a reconsideration or peer to peer.  Will discuss with Dr. Ladona Ridgel if he would like to pursue.

## 2023-04-16 ENCOUNTER — Telehealth: Payer: Self-pay

## 2023-04-16 NOTE — Telephone Encounter (Signed)
Patients MRI was denied.  Sent the Appeal form via email and MyChart for patient to sign and send back.

## 2023-04-19 ENCOUNTER — Telehealth: Payer: Self-pay

## 2023-04-19 NOTE — Telephone Encounter (Signed)
Faxed Letter of Appeal to [facility] of [place] Grievance and Appeal for cpt code [address] MRI of the breast.  Received faxed confirmation.

## 2023-05-06 ENCOUNTER — Encounter: Payer: Self-pay | Admitting: Family

## 2023-05-13 ENCOUNTER — Telehealth: Payer: Self-pay

## 2023-05-13 NOTE — Telephone Encounter (Signed)
Called [employer] for the status on the appeal for MRI of the breast.  Per Tori P. Call QMV#[id number], [id number] and appeal #[id number] is still pending and in review.  They have until 05/19/2023 to respond. I sent a message to patient via MyChart portal advising her the status.

## 2023-05-20 ENCOUNTER — Ambulatory Visit: Payer: Medicaid Other | Admitting: Family

## 2023-05-22 ENCOUNTER — Telehealth: Payer: Self-pay

## 2023-05-22 NOTE — Telephone Encounter (Signed)
Called [facility] and spoke with Jillene Bucks YQM#[id number] to check on the status of the appeal for CPT code 69629 authorization#[id number]. Was advised the file was closed because they did not receive a signed " Appeal Request Form" from the patient. However, once they receive it, they will re-open the case back up to review. The form was mailed out last month, but never received. Emailed another copy to patient at [email or web address] and requested her to fax back to our office ASAP.

## 2023-05-27 ENCOUNTER — Ambulatory Visit (INDEPENDENT_AMBULATORY_CARE_PROVIDER_SITE_OTHER): Payer: Medicaid Other | Admitting: Family

## 2023-05-27 VITALS — BP 117/79 | HR 75 | Temp 98.4°F | Ht 64.0 in | Wt 181.0 lb

## 2023-05-27 DIAGNOSIS — G43909 Migraine, unspecified, not intractable, without status migrainosus: Secondary | ICD-10-CM

## 2023-05-27 NOTE — Progress Notes (Signed)
Patient ID: Bridget Huffman, female    DOB: 1986-01-29  MRN: [id number]  CC: Pap Smear   Subjective: Bridget Huffman is a 37 y.o. female who presents for pap smear.   Her concerns today include:  - Patient presents for pap smear. Patient's last pap smear was 04/26/2020 and next pap smear not due to until 04/26/2025. Patient verbalized understanding. - Reports upcoming appointment with Neurology. Reports she is no longer taking Topiramate and Sumatriptan due to side effects and feels improved.  - No issues/concerns for discussion today.   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   Encounter for orthopedic follow-up care 12/29/2019   Laceration of left hand 12/21/2019   Pain in finger of left hand 12/15/2019   Decreased fetal movement 01/20/2017   Perforated appendicitis 06/01/2014   Acute appendicitis 05/31/2014   Annual physical exam 02/09/2014   Pap smear for cervical cancer screening 02/09/2014   NSVD (normal spontaneous vaginal delivery) 03/29/2012   Mild preeclampsia delivered 03/29/2012   GBS (group B streptococcus) UTI complicating pregnancy 08/21/2011     Current Outpatient Medications on File Prior to Visit  Medication Sig Dispense Refill   Multiple Vitamin (MULTIVITAMIN WITH MINERALS) TABS tablet Take 1 tablet by mouth daily.     No current facility-administered medications on file prior to visit.    No Known Allergies  Social History   Socioeconomic History   Marital status: Married    Spouse name: Not on file   Number of children: Not on file   Years of education: Not on file   Highest education level: Not on file  Occupational History   Not on file  Tobacco Use   Smoking status: Never    Passive exposure: Never   Smokeless tobacco: Never  Vaping Use   Vaping status: Never Used  Substance and Sexual Activity   Alcohol use: No   Drug use: No   Sexual activity: Yes    Birth control/protection: Condom   Other Topics Concern   Not on file  Social History Narrative   Not on file   Social Drivers of Health   Financial Resource Strain: Not on file  Food Insecurity: Not on file  Transportation Needs: Not on file  Physical Activity: Not on file  Stress: Not on file  Social Connections: Not on file  Intimate Partner Violence: Not on file    Family History  Problem Relation Age of Onset   Hypertension Mother    Alcohol abuse Neg Hx    Arthritis Neg Hx    Asthma Neg Hx    Birth defects Neg Hx    Cancer Neg Hx    COPD Neg Hx    Depression Neg Hx    Diabetes Neg Hx    Drug abuse Neg Hx    Early death Neg Hx    Hearing loss Neg Hx    Heart disease Neg Hx    Hyperlipidemia Neg Hx    Learning disabilities Neg Hx    Kidney disease Neg Hx    Mental illness Neg Hx    Mental retardation Neg Hx    Miscarriages / Stillbirths Neg Hx    Stroke Neg Hx    Vision loss Neg Hx     Past Surgical History:  Procedure Laterality Date   APPENDECTOMY     CESAREAN SECTION N/A 01/21/2017   Procedure: CESAREAN SECTION;  Surgeon: Marlow Baars, MD;  Location: [facility];  Service: Obstetrics;  Laterality: N/A;   CESAREAN SECTION N/A    Phreesia 08/30/2020   LAPAROSCOPIC APPENDECTOMY N/A 05/31/2014   Procedure: APPENDECTOMY LAPAROSCOPIC;  Surgeon: Axel Filler, MD;  Location: [facility];  Service: General;  Laterality: N/A;   RECONSTRUCTION TENDON PULLEY HAND Left 07/31/2022   TENOLYSIS Left 06/15/2020   Procedure: Left ring and small finger scar excision and tenolysis of Flexor digitorum superficialis/Flexor digitorum profundus repairs;  Surgeon: Bradly Bienenstock, MD;  Location: [facility];  Service: Orthopedics;  Laterality: Left;  with IV sedation needs 90 minutes    ROS: Review of Systems Negative except as stated above  PHYSICAL EXAM: BP 117/79   Pulse 75   Temp 98.4 F (36.9 C) (Oral)   Ht 5\' 4"  (1.626 m)   Wt 181 lb (82.1 kg)   LMP 04/26/2023 (Approximate)    SpO2 99%   BMI 31.07 kg/m   Physical Exam HENT:     Head: Normocephalic and atraumatic.     Nose: Nose normal.     Mouth/Throat:     Mouth: Mucous membranes are moist.     Pharynx: Oropharynx is clear.  Eyes:     Extraocular Movements: Extraocular movements intact.     Conjunctiva/sclera: Conjunctivae normal.     Pupils: Pupils are equal, round, and reactive to light.  Cardiovascular:     Rate and Rhythm: Normal rate and regular rhythm.     Pulses: Normal pulses.     Heart sounds: Normal heart sounds.  Pulmonary:     Effort: Pulmonary effort is normal.     Breath sounds: Normal breath sounds.  Musculoskeletal:        General: Normal range of motion.     Cervical back: Normal range of motion and neck supple.  Neurological:     General: No focal deficit present.     Mental Status: She is alert and oriented to person, place, and time.  Psychiatric:        Mood and Affect: Mood normal.        Behavior: Behavior normal.     ASSESSMENT AND PLAN: 1. Migraine without status migrainosus, not intractable, unspecified migraine type (Primary) - Keep upcoming appointment with Neurology.    Patient was given the opportunity to ask questions.  Patient verbalized understanding of the plan and was able to repeat key elements of the plan. Patient was given clear instructions to go to Emergency Department or return to medical center if symptoms don't improve, worsen, or new problems develop.The patient verbalized understanding.   Follow-up with primary provider as scheduled.   Rema Fendt, NP

## 2023-05-27 NOTE — Progress Notes (Signed)
Patient states no other concerns to discuss.

## 2023-05-30 ENCOUNTER — Telehealth: Payer: Self-pay

## 2023-05-30 NOTE — Telephone Encounter (Signed)
Faxed signed Appeal Request form to [phone] STAT.  Received fax confirmation

## 2023-06-24 ENCOUNTER — Telehealth: Payer: Self-pay

## 2023-06-24 NOTE — Telephone Encounter (Signed)
 Called patient, LMVM the Appeal was denied for the MRI of the breast and for her to call her PCP and have them put in an order for MMG to rule/out ruptures or capsule contractures. Patient was concerned the implants are 38 years old and have developed some discomfort on lateral portion of each breast. Dr. Waddell discussed with patient implants to not need to be replaced at 10 years or anytime particular if the implants are intact and not causing issues.  He had placed the order for an MRI per the patients request. See his notes on 04/03/2023.

## 2023-07-23 ENCOUNTER — Encounter: Payer: Self-pay | Admitting: Family

## 2023-07-23 ENCOUNTER — Other Ambulatory Visit (HOSPITAL_COMMUNITY)
Admission: RE | Admit: 2023-07-23 | Discharge: 2023-07-23 | Disposition: A | Discharge status: Home / Self Care | Payer: Medicaid Other | Source: Ambulatory Visit | Attending: Family | Admitting: Family

## 2023-07-23 ENCOUNTER — Ambulatory Visit (INDEPENDENT_AMBULATORY_CARE_PROVIDER_SITE_OTHER): Payer: Medicaid Other | Admitting: Family

## 2023-07-23 VITALS — BP 112/76 | HR 91 | Temp 98.4°F | Ht 66.0 in | Wt 182.6 lb

## 2023-07-23 DIAGNOSIS — Z114 Encounter for screening for human immunodeficiency virus [HIV]: Secondary | ICD-10-CM

## 2023-07-23 DIAGNOSIS — N898 Other specified noninflammatory disorders of vagina: Secondary | ICD-10-CM

## 2023-07-23 DIAGNOSIS — K649 Unspecified hemorrhoids: Secondary | ICD-10-CM | POA: Diagnosis not present

## 2023-07-23 LAB — POCT URINALYSIS DIP (CLINITEK)
Bilirubin, UA: NEGATIVE
Blood, UA: NEGATIVE
Glucose, UA: NEGATIVE mg/dL
Ketones, POC UA: NEGATIVE mg/dL
Leukocytes, UA: NEGATIVE
Nitrite, UA: NEGATIVE
POC PROTEIN,UA: NEGATIVE
Spec Grav, UA: 1.025 (ref 1.010–1.025)
Urobilinogen, UA: 0.2 U/dL
pH, UA: 7 (ref 5.0–8.0)

## 2023-07-23 MED ORDER — HYDROCORTISONE ACETATE 1 % EX OINT: 1.0000 | TOPICAL_OINTMENT | Freq: Every day | CUTANEOUS | 0 refills | Status: DC

## 2023-07-23 NOTE — Progress Notes (Signed)
Patient ID: Bridget Huffman, female    DOB: 04/07/1986  MRN: [id number]  CC: Follow-Up  Subjective: Bridget Huffman is a 38 y.o. female who presents for follow-up.   Her concerns today include:  - Right labia irritation. States began with small red bump that eventually burst. Reports bump became smaller but still there. Denies red flag symptoms.  - Hemorrhoids causing itching. Denies red flag symptoms.  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   Encounter for orthopedic follow-up care 12/29/2019   Laceration of left hand 12/21/2019   Pain in finger of left hand 12/15/2019   Decreased fetal movement 01/20/2017   Perforated appendicitis 06/01/2014   Acute appendicitis 05/31/2014   Annual physical exam 02/09/2014   Pap smear for cervical cancer screening 02/09/2014   NSVD (normal spontaneous vaginal delivery) 03/29/2012   Mild preeclampsia delivered 03/29/2012   GBS (group B streptococcus) UTI complicating pregnancy 08/21/2011     Current Outpatient Medications on File Prior to Visit  Medication Sig Dispense Refill   Multiple Vitamin (MULTIVITAMIN WITH MINERALS) TABS tablet Take 1 tablet by mouth daily.     No current facility-administered medications on file prior to visit.    No Known Allergies  Social History   Socioeconomic History   Marital status: Married    Spouse name: Not on file   Number of children: Not on file   Years of education: Not on file   Highest education level: Not on file  Occupational History   Not on file  Tobacco Use   Smoking status: Never    Passive exposure: Never   Smokeless tobacco: Never  Vaping Use   Vaping status: Never Used  Substance and Sexual Activity   Alcohol use: No   Drug use: No   Sexual activity: Yes    Birth control/protection: Condom  Other Topics Concern   Not on file  Social History Narrative   Not on file   Social Drivers of Health   Financial Resource Strain:  Low Risk  (07/23/2023)   Overall Financial Resource Strain (CARDIA)    Difficulty of Paying Living Expenses: Not hard at all  Food Insecurity: Not on file  Transportation Needs: Not on file  Physical Activity: Insufficiently Active (07/23/2023)   Exercise Vital Sign    Days of Exercise per Week: 1 day    Minutes of Exercise per Session: 30 min  Stress: No Stress Concern Present (07/23/2023)   [employer] of Occupational Health - Occupational Stress Questionnaire    Feeling of Stress : Not at all  Social Connections: Socially Integrated (07/23/2023)   Social Connection and Isolation Panel [NHANES]    Frequency of Communication with Friends and Family: Twice a week    Frequency of Social Gatherings with Friends and Family: Once a week    Attends Religious Services: More than 4 times per year    Active Member of [employer] or Organizations: Yes    Attends Banker Meetings: More than 4 times per year    Marital Status: Married  Catering manager Violence: Not At Risk (07/23/2023)   Humiliation, Afraid, Rape, and Kick questionnaire    Fear of Current or Ex-Partner: No    Emotionally Abused: No    Physically Abused: No    Sexually Abused: No    Family History  Problem Relation Age of Onset   Hypertension Mother    Alcohol abuse Neg Hx    Arthritis Neg Hx  Asthma Neg Hx    Birth defects Neg Hx    Cancer Neg Hx    COPD Neg Hx    Depression Neg Hx    Diabetes Neg Hx    Drug abuse Neg Hx    Early death Neg Hx    Hearing loss Neg Hx    Heart disease Neg Hx    Hyperlipidemia Neg Hx    Learning disabilities Neg Hx    Kidney disease Neg Hx    Mental illness Neg Hx    Mental retardation Neg Hx    Miscarriages / Stillbirths Neg Hx    Stroke Neg Hx    Vision loss Neg Hx     Past Surgical History:  Procedure Laterality Date   APPENDECTOMY     CESAREAN SECTION N/A 01/21/2017   Procedure: CESAREAN SECTION;  Surgeon: Marlow Baars, MD;  Location: [facility];   Service: Obstetrics;  Laterality: N/A;   CESAREAN SECTION N/A    Phreesia 08/30/2020   LAPAROSCOPIC APPENDECTOMY N/A 05/31/2014   Procedure: APPENDECTOMY LAPAROSCOPIC;  Surgeon: Axel Filler, MD;  Location: [facility];  Service: General;  Laterality: N/A;   RECONSTRUCTION TENDON PULLEY HAND Left 07/31/2022   TENOLYSIS Left 06/15/2020   Procedure: Left ring and small finger scar excision and tenolysis of Flexor digitorum superficialis/Flexor digitorum profundus repairs;  Surgeon: Bradly Bienenstock, MD;  Location: [facility];  Service: Orthopedics;  Laterality: Left;  with IV sedation needs 90 minutes    ROS: Review of Systems Negative except as stated above  PHYSICAL EXAM: BP 112/76   Pulse 91   Temp 98.4 F (36.9 C) (Oral)   Ht 5\' 6"  (1.676 m)   Wt 182 lb 9.6 oz (82.8 kg)   LMP 06/27/2023 (Approximate)   SpO2 98%   BMI 29.47 kg/m   Physical Exam HENT:     Head: Normocephalic and atraumatic.     Nose: Nose normal.     Mouth/Throat:     Mouth: Mucous membranes are moist.     Pharynx: Oropharynx is clear.  Eyes:     Extraocular Movements: Extraocular movements intact.     Conjunctiva/sclera: Conjunctivae normal.     Pupils: Pupils are equal, round, and reactive to light.  Cardiovascular:     Rate and Rhythm: Normal rate and regular rhythm.     Pulses: Normal pulses.     Heart sounds: Normal heart sounds.  Pulmonary:     Effort: Pulmonary effort is normal.     Breath sounds: Normal breath sounds.  Genitourinary:    General: Normal vulva.     Rectum: External hemorrhoid present.     Comments: Erythematous bump right labia minora. Susa Griffins, CMA present.  Musculoskeletal:        General: Normal range of motion.     Cervical back: Normal range of motion and neck supple.  Neurological:     General: No focal deficit present.     Mental Status: She is alert and oriented to person, place, and time.  Psychiatric:        Mood and Affect: Mood normal.         Behavior: Behavior normal.    ASSESSMENT AND PLAN: 1. Vaginal irritation (Primary) - Routine screening.  - Referral to Gynecology for evaluation/management. - Cervicovaginal ancillary only - POCT URINALYSIS DIP (CLINITEK); Future - Ambulatory referral to Gynecology - POCT URINALYSIS DIP (CLINITEK)  2. Encounter for screening for HIV - Routine screening.  - HIV antibody (with reflex)  3. Hemorrhoids, unspecified hemorrhoid type - Hydrocortisone Acetate ointment as prescribed. Counseled on medication adherence/adverse effects.  - Referral to Gastroenterology for evaluation/management. - Ambulatory referral to Gastroenterology - Hydrocortisone Acetate 1 % OINT; Apply 1 Application topically daily.  Dispense: 28.4 g; Refill: 0    Patient was given the opportunity to ask questions.  Patient verbalized understanding of the plan and was able to repeat key elements of the plan. Patient was given clear instructions to go to Emergency Department or return to medical center if symptoms don't improve, worsen, or new problems develop.The patient verbalized understanding.   Orders Placed This Encounter  Procedures   HIV antibody (with reflex)   Ambulatory referral to Gynecology   Ambulatory referral to Gastroenterology   POCT URINALYSIS DIP (CLINITEK)     Requested Prescriptions   Signed Prescriptions Disp Refills   Hydrocortisone Acetate 1 % OINT 28.4 g 0    Sig: Apply 1 Application topically daily.    No follow-ups on file.  Rema Fendt, NP

## 2023-07-23 NOTE — Progress Notes (Signed)
Patient states no other concerns to discuss.

## 2023-07-24 LAB — CERVICOVAGINAL ANCILLARY ONLY
Bacterial Vaginitis (gardnerella): NEGATIVE
Candida Glabrata: NEGATIVE
Candida Vaginitis: NEGATIVE
Chlamydia: NEGATIVE
Comment: NEGATIVE
Comment: NEGATIVE
Comment: NEGATIVE
Comment: NEGATIVE
Comment: NEGATIVE
Comment: NORMAL
Neisseria Gonorrhea: NEGATIVE
Trichomonas: NEGATIVE

## 2023-07-24 LAB — HIV ANTIBODY (ROUTINE TESTING W REFLEX): HIV Screen 4th Generation wRfx: NONREACTIVE

## 2023-07-31 ENCOUNTER — Ambulatory Visit: Payer: Medicaid Other | Admitting: Neurology

## 2023-09-06 ENCOUNTER — Ambulatory Visit
Admission: RE | Admit: 2023-09-06 | Discharge: 2023-09-06 | Disposition: A | Discharge status: Home / Self Care | Source: Ambulatory Visit | Attending: Internal Medicine | Admitting: Internal Medicine

## 2023-09-06 VITALS — BP 118/75 | HR 80 | Temp 98.2°F | Resp 17

## 2023-09-06 DIAGNOSIS — L239 Allergic contact dermatitis, unspecified cause: Secondary | ICD-10-CM

## 2023-09-06 DIAGNOSIS — L738 Other specified follicular disorders: Secondary | ICD-10-CM

## 2023-09-06 MED ORDER — CEPHALEXIN 500 MG PO CAPS: 500.0000 mg | ORAL_CAPSULE | Freq: Three times a day (TID) | ORAL | 0 refills | Status: AC

## 2023-09-06 MED ORDER — PREDNISONE 20 MG PO TABS: 40.0000 mg | ORAL_TABLET | Freq: Every day | ORAL | 0 refills | Status: DC

## 2023-09-06 MED ORDER — CEPHALEXIN 500 MG PO CAPS: 500.0000 mg | ORAL_CAPSULE | Freq: Three times a day (TID) | ORAL | 0 refills | Status: DC

## 2023-09-06 MED ORDER — PREDNISONE 20 MG PO TABS: 40.0000 mg | ORAL_TABLET | Freq: Every day | ORAL | 0 refills | Status: AC

## 2023-09-06 NOTE — ED Provider Notes (Signed)
 [facility]    CSN: [id number] Arrival date & time: 09/06/23  1427      History   Chief Complaint Chief Complaint  Patient presents with   Rash    Inner thighs rash - Entered by patient    HPI Bridget Huffman is a 38 y.o. female.   Patient presents to urgent care for evaluation of rash to the bilateral inner thighs/pubic region that started approximately 1 week ago.  Patient had recently shaved her pubic hair 1 week prior to rash onset.  Rash initially consisted of irritated red bumps to the bilateral inguinal region and upper thighs.  She started putting soothing creams, lotions, and anti-itch sprays to the rash which has not helped with irritation and has made rash worse.  She states the rash has spread around the right upper thigh and into the right side/buttock.  Denies drainage from the rash, recent changes in soaps/laundry detergents, vaginal discharge, urinary symptoms, and recent antibiotic/steroid use.  No history of immunosuppression.   Rash   Past Medical History:  Diagnosis Date   Hx of preeclampsia, prior pregnancy, currently pregnant    No pertinent past medical history    Pregnancy induced hypertension    previous pregnancy    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   Encounter for orthopedic follow-up care 12/29/2019   Laceration of left hand 12/21/2019   Pain in finger of left hand 12/15/2019   Decreased fetal movement 01/20/2017   Perforated appendicitis 06/01/2014   Acute appendicitis 05/31/2014   Annual physical exam 02/09/2014   Pap smear for cervical cancer screening 02/09/2014   NSVD (normal spontaneous vaginal delivery) 03/29/2012   Mild preeclampsia delivered 03/29/2012   GBS (group B streptococcus) UTI complicating pregnancy 08/21/2011    Past Surgical History:  Procedure Laterality Date   APPENDECTOMY     CESAREAN SECTION N/A 01/21/2017   Procedure: CESAREAN SECTION;  Surgeon: Marlow Baars, MD;  Location: [facility];  Service: Obstetrics;  Laterality: N/A;   CESAREAN SECTION N/A    [employer] 08/30/2020   LAPAROSCOPIC APPENDECTOMY N/A 05/31/2014   Procedure: APPENDECTOMY LAPAROSCOPIC;  Surgeon: Axel Filler, MD;  Location: [facility] OR;  Service: General;  Laterality: N/A;   RECONSTRUCTION TENDON PULLEY HAND Left 07/31/2022   TENOLYSIS Left 06/15/2020   Procedure: Left ring and small finger scar excision and tenolysis of Flexor digitorum superficialis/Flexor digitorum profundus repairs;  Surgeon: Bradly Bienenstock, MD;  Location: [facility];  Service: Orthopedics;  Laterality: Left;  with IV sedation needs 90 minutes    OB History     Gravida  2   Para  1   Term  1   Preterm  0   AB  0   Living  1      SAB  0   IAB  0   Ectopic  0   Multiple  0   Live Births  1            Home Medications    Prior to Admission medications   Medication Sig Start Date End Date Taking? Authorizing Provider  cephALEXin (KEFLEX) 500 MG capsule Take 1 capsule (500 mg total) by mouth 3 (three) times daily for 7 days. 09/06/23 09/13/23 Yes Della Scrivener, Donavan Burnet, FNP  predniSONE (DELTASONE) 20 MG tablet Take 2 tablets (40 mg total) by mouth daily with breakfast for 5 days. 09/06/23 09/11/23 Yes Carlisle Beers, FNP  Hydrocortisone Acetate 1 % OINT  Apply 1 Application topically daily. 07/23/23   Rema Fendt, NP  Multiple Vitamin (MULTIVITAMIN WITH MINERALS) TABS tablet Take 1 tablet by mouth daily.    [provider]    Family History Family History  Problem Relation Age of Onset   Hypertension Mother    Alcohol abuse Neg Hx    Arthritis Neg Hx    Asthma Neg Hx    Birth defects Neg Hx    Cancer Neg Hx    COPD Neg Hx    Depression Neg Hx    Diabetes Neg Hx    Drug abuse Neg Hx    Early death Neg Hx    Hearing loss Neg Hx    Heart disease Neg Hx    Hyperlipidemia Neg Hx    Learning disabilities Neg Hx    Kidney disease Neg Hx     Mental illness Neg Hx    Mental retardation Neg Hx    Miscarriages / Stillbirths Neg Hx    Stroke Neg Hx    Vision loss Neg Hx     Social History Social History   Tobacco Use   Smoking status: Never    Passive exposure: Never   Smokeless tobacco: Never  Vaping Use   Vaping status: Never Used  Substance Use Topics   Alcohol use: No   Drug use: No     Allergies   Patient has no known allergies.   Review of Systems Review of Systems  Skin:  Positive for rash.  Per HPI  Physical Exam Triage Vital Signs ED Triage Vitals  Encounter Vitals Group     BP 09/06/23 1446 118/75     Systolic BP Percentile --      Diastolic BP Percentile --      Pulse Rate 09/06/23 1446 80     Resp 09/06/23 1446 17     Temp 09/06/23 1446 98.2 F (36.8 C)     Temp Source 09/06/23 1446 Oral     SpO2 09/06/23 1446 98 %     Weight --      Height --      Head Circumference --      Peak Flow --      Pain Score 09/06/23 1450 0     Pain Loc --      Pain Education --      Exclude from Growth Chart --    No data found.  Updated Vital Signs BP 118/75 (BP Location: Right Arm)   Pulse 80   Temp 98.2 F (36.8 C) (Oral)   Resp 17   LMP 09/01/2023 (Approximate)   SpO2 98%   Visual Acuity Right Eye Distance:   Left Eye Distance:   Bilateral Distance:    Right Eye Near:   Left Eye Near:    Bilateral Near:     Physical Exam Vitals and nursing note reviewed.  Constitutional:      Appearance: She is not ill-appearing or toxic-appearing.  HENT:     Head: Normocephalic and atraumatic.     Right Ear: Hearing and external ear normal.     Left Ear: Hearing and external ear normal.     Nose: Nose normal.     Mouth/Throat:     Lips: Pink.  Eyes:     General: Lids are normal. Vision grossly intact. Gaze aligned appropriately.     Extraocular Movements: Extraocular movements intact.     Conjunctiva/sclera: Conjunctivae normal.  Pulmonary:     Effort: Pulmonary  effort is normal.   Musculoskeletal:     Cervical back: Neck supple.  Skin:    General: Skin is warm and dry.     Capillary Refill: Capillary refill takes less than 2 seconds.     Findings: Rash present. Rash is macular, papular and pustular.          Comments: Pustular lesions to hair follicles and maculopapular rash to the right groin/proximal upper thigh. No annular lesions. No diffuse erythema to base of rash. Rash is non-draining/weeping. No erythematous lesions with raised or well-defined borders.   Neurological:     General: No focal deficit present.     Mental Status: She is alert and oriented to person, place, and time. Mental status is at baseline.     Cranial Nerves: No dysarthria or facial asymmetry.  Psychiatric:        Mood and Affect: Mood normal.        Speech: Speech normal.        Behavior: Behavior normal.        Thought Content: Thought content normal.        Judgment: Judgment normal.      UC Treatments / Results  Labs (all labs ordered are listed, but only abnormal results are displayed) Labs Reviewed - No data to display  EKG   Radiology No results found.  Procedures Procedures (including critical care time)  Medications Ordered in UC Medications - No data to display  Initial Impression / Assessment and Plan / UC Course  I have reviewed the triage vital signs and the nursing notes.  Pertinent labs & imaging results that were available during my care of the patient were reviewed by me and considered in my medical decision making (see chart for details).   1. Bacterial folliculitis, allergic dermatitis Presentation consistent with acute bacterial folliculitis and allergic dermatitis due to recent shaving with razor and application of multiple new anti-itch creams/sprays versus tinea cruris.  Will trial use of cephalexin 3 times daily for 7 days and prednisone burst once daily for 5 days to treat bacterial folliculitis/allergic process. Advised to avoid shaving  pubic/thigh hair until symptoms have fully improved. Infection return precautions discussed.  Counseled patient on potential for adverse effects with medications prescribed/recommended today, strict ER and return-to-clinic precautions discussed, patient verbalized understanding.    Final Clinical Impressions(s) / UC Diagnoses   Final diagnoses:  Bacterial folliculitis     Discharge Instructions      Take antibiotic as prescribed every 8 hours for the next 7 days. Take prednisone 40mg  once daily for 5 days with food. Avoid taking ibuprofen when taking prednisone.  If you develop any new or worsening symptoms or if your symptoms do not start to improve, please return here or follow-up with your primary care provider. If your symptoms are severe, please go to the emergency room.    ED Prescriptions     Medication Sig Dispense Auth. Provider   cephALEXin (KEFLEX) 500 MG capsule Take 1 capsule (500 mg total) by mouth 3 (three) times daily for 7 days. 21 capsule Reita May M, FNP   predniSONE (DELTASONE) 20 MG tablet Take 2 tablets (40 mg total) by mouth daily with breakfast for 5 days. 10 tablet Carlisle Beers, FNP      PDMP not reviewed this encounter.   Carlisle Beers, [place] 09/06/23 1601

## 2023-09-06 NOTE — ED Triage Notes (Addendum)
 Pt presents with rash on bilateral inner thighs for 1.5 weeks. She has tried over the counter creams which have not helped.

## 2023-09-06 NOTE — Discharge Instructions (Signed)
 Take antibiotic as prescribed every 8 hours for the next 7 days. Take prednisone 40mg  once daily for 5 days with food. Avoid taking ibuprofen when taking prednisone.  If you develop any new or worsening symptoms or if your symptoms do not start to improve, please return here or follow-up with your primary care provider. If your symptoms are severe, please go to the emergency room.

## 2023-09-08 ENCOUNTER — Emergency Department (HOSPITAL_COMMUNITY)
Admission: EM | Admit: 2023-09-08 | Discharge: 2023-09-08 | Disposition: A | Discharge status: Home / Self Care | Attending: Emergency Medicine | Admitting: Emergency Medicine

## 2023-09-08 ENCOUNTER — Encounter (HOSPITAL_COMMUNITY): Payer: Self-pay | Admitting: *Deleted

## 2023-09-08 ENCOUNTER — Other Ambulatory Visit: Payer: Self-pay

## 2023-09-08 DIAGNOSIS — R21 Rash and other nonspecific skin eruption: Secondary | ICD-10-CM | POA: Diagnosis not present

## 2023-09-08 DIAGNOSIS — D72829 Elevated white blood cell count, unspecified: Secondary | ICD-10-CM | POA: Insufficient documentation

## 2023-09-08 LAB — COMPREHENSIVE METABOLIC PANEL WITH GFR
ALT: 29 U/L (ref 0–44)
AST: 22 U/L (ref 15–41)
Albumin: 3.8 g/dL (ref 3.5–5.0)
Alkaline Phosphatase: 47 U/L (ref 38–126)
Anion gap: 6 (ref 5–15)
BUN: 12 mg/dL (ref 6–20)
CO2: 25 mmol/L (ref 22–32)
Calcium: 9.5 mg/dL (ref 8.9–10.3)
Chloride: 108 mmol/L (ref 98–111)
Creatinine, Ser: 0.65 mg/dL (ref 0.44–1.00)
GFR, Estimated: >60 mL/min (ref >60)
Glucose, Bld: 111 mg/dL — ABNORMAL HIGH (ref 70–99)
Potassium: 3.6 mmol/L (ref 3.5–5.1)
Sodium: 139 mmol/L (ref 135–145)
Total Bilirubin: 0.5 mg/dL (ref 0.0–1.2)
Total Protein: 6.4 g/dL — ABNORMAL LOW (ref 6.5–8.1)

## 2023-09-08 LAB — CBC WITH DIFFERENTIAL/PLATELET
Abs Immature Granulocytes: 0.03 10*3/uL (ref 0.00–0.07)
Basophils Absolute: 0.1 10*3/uL (ref 0.0–0.1)
Basophils Relative: 0 %
Eosinophils Absolute: 0.3 10*3/uL (ref 0.0–0.5)
Eosinophils Relative: 2 %
HCT: 39.5 % (ref 36.0–46.0)
Hemoglobin: 13.5 g/dL (ref 12.0–15.0)
Immature Granulocytes: 0 %
Lymphocytes Relative: 30 %
Lymphs Abs: 3.7 10*3/uL (ref 0.7–4.0)
MCH: 30.5 pg (ref 26.0–34.0)
MCHC: 34.2 g/dL (ref 30.0–36.0)
MCV: 89.2 fL (ref 80.0–100.0)
Monocytes Absolute: 0.9 10*3/uL (ref 0.1–1.0)
Monocytes Relative: 7 %
Neutro Abs: 7.3 10*3/uL (ref 1.7–7.7)
Neutrophils Relative %: 61 %
Platelets: 231 10*3/uL (ref 150–400)
RBC: 4.43 MIL/uL (ref 3.87–5.11)
RDW: 11.9 % (ref 11.5–15.5)
WBC: 12.2 10*3/uL — ABNORMAL HIGH (ref 4.0–10.5)
nRBC: 0 % (ref 0.0–0.2)

## 2023-09-08 LAB — HCG, SERUM, QUALITATIVE: Preg, Serum: NEGATIVE

## 2023-09-08 LAB — PROTIME-INR
INR: 1 (ref 0.8–1.2)
Prothrombin Time: 13.1 s (ref 11.4–15.2)

## 2023-09-08 MED ORDER — CETIRIZINE HCL 10 MG PO TABS: 10.0000 mg | ORAL_TABLET | Freq: Every day | ORAL | 0 refills | Status: AC

## 2023-09-08 MED ORDER — LORATADINE 10 MG PO TABS
10.0000 mg | ORAL_TABLET | Freq: Once | ORAL | Status: AC
  Administered 2023-09-08: 10 mg via ORAL
  Filled 2023-09-08: qty 1

## 2023-09-08 NOTE — ED Provider Notes (Signed)
 [facility] EMERGENCY DEPARTMENT AT [facility] Provider Note   CSN: [id number] Arrival date & time: 09/08/23  0100     History  Chief Complaint  Patient presents with   Rash    Bridget Huffman is a 38 y.o. female.  The history is provided by the patient and medical records.  Rash Bridget Huffman is a 38 y.o. female who presents to the Emergency Department complaining of rash.  She presents to the emergency department for evaluation of 1 week of itchy rash to her inner thighs.  She went to urgent care yesterday and was prescribed Keflex and prednisone, which she started taking today.  She states that now there is some rash on her arm and she has bruising to her outer legs and she became concerned, which prompted ED visit.  No fever, chest pain, shortness of breath, nausea, vomiting.  No new detergents, creams, foods.  No one in the home has similar rash.  No hot tub use.      Home Medications Prior to Admission medications   Medication Sig Start Date End Date Taking? Authorizing Provider  cetirizine (ZYRTEC) 10 MG tablet Take 1 tablet (10 mg total) by mouth daily. 09/08/23  Yes Tilden Fossa, MD  cephALEXin (KEFLEX) 500 MG capsule Take 1 capsule (500 mg total) by mouth 3 (three) times daily for 7 days. 09/06/23 09/13/23  Carlisle Beers, FNP  Hydrocortisone Acetate 1 % OINT Apply 1 Application topically daily. 07/23/23   Rema Fendt, NP  Multiple Vitamin (MULTIVITAMIN WITH MINERALS) TABS tablet Take 1 tablet by mouth daily.    [provider]  predniSONE (DELTASONE) 20 MG tablet Take 2 tablets (40 mg total) by mouth daily with breakfast for 5 days. 09/06/23 09/11/23  Carlisle Beers, FNP      Allergies    Patient has no known allergies.    Review of Systems   Review of Systems  Skin:  Positive for rash.  All other systems reviewed and are negative.   Physical Exam Updated Vital Signs BP 119/78   Pulse 78   Temp 98.2 F (36.8 C)    Resp 16   Ht 5\' 6"  (1.676 m)   Wt 82.8 kg   LMP 09/01/2023 (Approximate)   SpO2 100%   BMI 29.46 kg/m  Physical Exam Vitals and nursing note reviewed.  Constitutional:      Appearance: She is well-developed.  HENT:     Head: Normocephalic and atraumatic.  Cardiovascular:     Rate and Rhythm: Normal rate and regular rhythm.  Pulmonary:     Effort: Pulmonary effort is normal. No respiratory distress.  Abdominal:     Palpations: Abdomen is soft.     Tenderness: There is no abdominal tenderness. There is no guarding or rebound.  Musculoskeletal:        General: No tenderness.  Skin:    General: Skin is warm and dry.     Comments: There are small scattered papules to the inner and outer thighs.  There are a few papules to the right arm.  No petechiae.  There are a few bruises to the outer thighs.  Neurological:     Mental Status: She is alert and oriented to person, place, and time.  Psychiatric:        Behavior: Behavior normal.     ED Results / Procedures / Treatments   Labs (all labs ordered are listed, but only abnormal results are displayed) Labs Reviewed  COMPREHENSIVE METABOLIC  PANEL WITH GFR - Abnormal; Notable for the following components:      Result Value   Glucose, Bld 111 (*)    Total Protein 6.4 (*)    All other components within normal limits  CBC WITH DIFFERENTIAL/PLATELET - Abnormal; Notable for the following components:   WBC 12.2 (*)    All other components within normal limits  PROTIME-INR  HCG, SERUM, QUALITATIVE    EKG None  Radiology No results found.  Procedures Procedures    Medications Ordered in ED Medications  loratadine (CLARITIN) tablet 10 mg (10 mg Oral Given 09/08/23 0314)    ED Course/ Medical Decision Making/ A&P                                 Medical Decision Making Amount and/or Complexity of Data Reviewed Labs: ordered.  Risk OTC drugs.   Patient here for evaluation of 1 week of itchy rash, started Keflex and  prednisone today but also noticed bruising today.  She does have bruising on the outer thighs, suspect this pattern is secondary to scratching.  No evidence of coagulopathy.  CBC with mild leukocytosis, suspect this is demargination secondary to prednisone use.  No evidence of acute infectious process.  No evidence of SJS/TEN, EM, impetigo.  Discussed home care for rash, will prescribe cetirizine.  Discussed outpatient follow-up and return precautions.        Final Clinical Impression(s) / ED Diagnoses Final diagnoses:  Rash    Rx / DC Orders ED Discharge Orders          Ordered    cetirizine (ZYRTEC) 10 MG tablet  Daily        09/08/23 0359              Tilden Fossa, MD 09/08/23 [phone]

## 2023-09-08 NOTE — ED Triage Notes (Signed)
 The pt has had a rash on her body for 1-2 weeks  the rash started on her inner  thighs  now it is spreading all over her body and itching and the rash keeps spreading.  Starting today the rash has turned into bruises on her thighs.  She was seen at urgent care and was given an antibiotic  lmp march 23rd

## 2023-09-11 ENCOUNTER — Encounter: Payer: Self-pay | Admitting: Family

## 2023-09-12 NOTE — Telephone Encounter (Signed)
 Schedule appointment?

## 2023-09-14 ENCOUNTER — Telehealth: Admitting: Family Medicine

## 2023-09-14 DIAGNOSIS — Z0182 Encounter for allergy testing: Secondary | ICD-10-CM

## 2023-09-14 NOTE — Progress Notes (Signed)
 Virtual Visit Consent   Bridget Huffman, you are scheduled for a virtual visit with a [facility] provider today. Just as with appointments in the office, your consent must be obtained to participate. Your consent will be active for this visit and any virtual visit you may have with one of our providers in the next 365 days. If you have a MyChart account, a copy of this consent can be sent to you electronically.  As this is a virtual visit, video technology does not allow for your provider to perform a traditional examination. This may limit your provider's ability to fully assess your condition. If your provider identifies any concerns that need to be evaluated in person or the need to arrange testing (such as labs, EKG, etc.), we will make arrangements to do so. Although advances in technology are sophisticated, we cannot ensure that it will always work on either your end or our end. If the connection with a video visit is poor, the visit may have to be switched to a telephone visit. With either a video or telephone visit, we are not always able to ensure that we have a secure connection.  By engaging in this virtual visit, you consent to the provision of healthcare and authorize for your insurance to be billed (if applicable) for the services provided during this visit. Depending on your insurance coverage, you may receive a charge related to this service.  I need to obtain your verbal consent now. Are you willing to proceed with your visit today? Bridget Huffman has provided verbal consent on 09/14/2023 for a virtual visit (video or telephone). [name], [place]  Date: 09/14/2023 12:19 PM   Virtual Visit via Video Note   I, [name], connected with  Bridget Huffman  ([id number], April 08, 1986) on 09/14/23 at 12:15 PM EDT by a video-enabled telemedicine application and verified that I am speaking with the correct person using two identifiers.  Location: Patient: Virtual Visit  Location Patient: Home Provider: Virtual Visit Location Provider: Home Office   I discussed the limitations of evaluation and management by telemedicine and the availability of in person appointments. The patient expressed understanding and agreed to proceed.    History of Present Illness: Bridget Huffman is a 38 y.o. who identifies as a female who was assigned female at birth, and is being seen today for wanting allergy testing.  HPI: HPI  Problems:  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   Encounter for orthopedic follow-up care 12/29/2019   Laceration of left hand 12/21/2019   Pain in finger of left hand 12/15/2019   Decreased fetal movement 01/20/2017   Perforated appendicitis 06/01/2014   Acute appendicitis 05/31/2014   Annual physical exam 02/09/2014   Pap smear for cervical cancer screening 02/09/2014   NSVD (normal spontaneous vaginal delivery) 03/29/2012   Mild preeclampsia delivered 03/29/2012   GBS (group B streptococcus) UTI complicating pregnancy 08/21/2011    Allergies: No Known Allergies Medications:  Current Outpatient Medications:    cetirizine (ZYRTEC) 10 MG tablet, Take 1 tablet (10 mg total) by mouth daily., Disp: 14 tablet, Rfl: 0   Hydrocortisone Acetate 1 % OINT, Apply 1 Application topically daily., Disp: 28.4 g, Rfl: 0   Multiple Vitamin (MULTIVITAMIN WITH MINERALS) TABS tablet, Take 1 tablet by mouth daily., Disp: , Rfl:   Observations/Objective: Patient is well-developed, well-nourished in no acute distress.  Resting comfortably at home.  Head is normocephalic, atraumatic.  No labored breathing.  Speech is  clear and coherent with logical content.  Patient is alert and oriented at baseline.    Assessment and Plan: There are no diagnoses linked to this encounter. -Pt wanted allergy testing   Follow Up Instructions: I discussed the assessment and treatment plan with the patient. The patient was provided an  opportunity to ask questions and all were answered. The patient agreed with the plan and demonstrated an understanding of the instructions.  A copy of instructions were sent to the patient via MyChart unless otherwise noted below.     The patient was advised to call back or seek an in-person evaluation if the symptoms worsen or if the condition fails to improve as anticipated.    [name], PA-C

## 2023-09-18 ENCOUNTER — Ambulatory Visit: Payer: Self-pay

## 2023-09-18 NOTE — Telephone Encounter (Signed)
  Chief Complaint: rash Symptoms: severe itching, red spots and purple where she has been scratching Frequency: 3 weeks Pertinent Negatives: Patient denies fever, joint swelling Disposition: [] ED /[] Urgent Care (no appt availability in office) / [] Appointment(In office/virtual)/ []  [facility]/ [] Home Care/ [] Refused Recommended Disposition /[x] [facility]/ []  Follow-up with PCP Additional Notes: no appts Copied from CRM [id number]. Topic: Clinical - Red Word Triage >> Sep 18, 2023  1:53 PM Shardie S wrote: [facility] that prompted transfer to Nurse Triage: body itching, swelling, redness Reason for Disposition  SEVERE itching (i.e., interferes with sleep, normal activities or school)  Answer Assessment - Initial Assessment Questions 1. APPEARANCE of RASH: "Describe the rash." (e.g., spots, blisters, raised areas, skin peeling, scaly)     Spots and bumps, scaly, rash turns purple where you scratch  2. SIZE: "How big are the spots?" (e.g., tip of pen, eraser, coin; inches, centimeters)     Tiny little dots  3. LOCATION: "Where is the rash located?"     Side of legs, inner thighs, arms, lower back   4. COLOR: "What color is the rash?" (Note: It is difficult to assess rash color in people with darker-colored skin. When this situation occurs, simply ask the caller to describe what they see.)     Red and purple  5. ONSET: "When did the rash begin?"     3 weeks ago  6. FEVER: "Do you have a fever?" If Yes, ask: "What is your temperature, how was it measured, and when did it start?"     no 7. ITCHING: "Does the rash itch?" If Yes, ask: "How bad is the itch?" (Scale 1-10; or mild, moderate, severe)     Yes /mild  to severe at night  8. CAUSE: "What do you think is causing the rash?"     'pollen' 9. MEDICINE FACTORS: "Have you started any new medicines within the last 2 weeks?" (e.g., antibiotics)      no 10. OTHER SYMPTOMS: "Do you have any other symptoms?" (e.g., dizziness,  headache, sore throat, joint pain)       no  Protocols used: Rash or Redness - [facility]

## 2023-09-18 NOTE — Telephone Encounter (Signed)
 Noted.

## 2023-10-09 ENCOUNTER — Ambulatory Visit (INDEPENDENT_AMBULATORY_CARE_PROVIDER_SITE_OTHER): Admitting: Family

## 2023-10-09 VITALS — BP 114/77 | HR 70 | Temp 98.2°F | Resp 16 | Ht 65.5 in | Wt 185.8 lb

## 2023-10-09 DIAGNOSIS — Z0100 Encounter for examination of eyes and vision without abnormal findings: Secondary | ICD-10-CM | POA: Diagnosis not present

## 2023-10-09 DIAGNOSIS — R21 Rash and other nonspecific skin eruption: Secondary | ICD-10-CM

## 2023-10-09 DIAGNOSIS — J3089 Other allergic rhinitis: Secondary | ICD-10-CM

## 2023-10-09 MED ORDER — TRIAMCINOLONE ACETONIDE 0.025 % EX CREA: 1.0000 | TOPICAL_CREAM | Freq: Two times a day (BID) | CUTANEOUS | 1 refills | Status: AC

## 2023-10-09 NOTE — Progress Notes (Signed)
 Patient ID: Bridget Huffman, female    DOB: 09-11-1985  MRN: [id number]  CC: Rash  Subjective: Bridget Huffman is a 38 y.o. female who presents for rash.   Her concerns today include:  - States rash of bilateral legs causing itching for several weeks. Denies red flag symptoms. Using over-the-counter cortisone ointment with minimal relief. States thinks may be related to allergies and requests referral to specialist.  - Reports right eye blurry vision. Denies red flag symptoms. Requests referral to eye doctor.  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   Encounter for orthopedic follow-up care 12/29/2019   Laceration of left hand 12/21/2019   Pain in finger of left hand 12/15/2019   Decreased fetal movement 01/20/2017   Perforated appendicitis 06/01/2014   Acute appendicitis 05/31/2014   Annual physical exam 02/09/2014   Pap smear for cervical cancer screening 02/09/2014   NSVD (normal spontaneous vaginal delivery) 03/29/2012   Mild preeclampsia delivered 03/29/2012   GBS (group B streptococcus) UTI complicating pregnancy 08/21/2011     Current Outpatient Medications on File Prior to Visit  Medication Sig Dispense Refill   Hydrocortisone  Acetate 1 % OINT Apply 1 Application topically daily. 28.4 g 0   Multiple Vitamin (MULTIVITAMIN WITH MINERALS) TABS tablet Take 1 tablet by mouth daily.     cetirizine  (ZYRTEC ) 10 MG tablet Take 1 tablet (10 mg total) by mouth daily. 14 tablet 0   No current facility-administered medications on file prior to visit.    No Known Allergies  Social History   Socioeconomic History   Marital status: Married    Spouse name: Not on file   Number of children: Not on file   Years of education: Not on file   Highest education level: GED or equivalent  Occupational History   Not on file  Tobacco Use   Smoking status: Never    Passive exposure: Never   Smokeless tobacco: Never  Vaping Use   Vaping  status: Never Used  Substance and Sexual Activity   Alcohol use: No   Drug use: No   Sexual activity: Yes    Birth control/protection: Condom  Other Topics Concern   Not on file  Social History Narrative   Not on file   Social Drivers of Health   Financial Resource Strain: Low Risk  (10/09/2023)   Overall Financial Resource Strain (CARDIA)    Difficulty of Paying Living Expenses: Not very hard  Food Insecurity: No Food Insecurity (10/09/2023)   Hunger Vital Sign    Worried About Running Out of Food in the Last Year: Never true    Ran Out of Food in the Last Year: Never true  Transportation Needs: No Transportation Needs (10/09/2023)   PRAPARE - Administrator, Civil Service (Medical): No    Lack of Transportation (Non-Medical): No  Physical Activity: Insufficiently Active (10/09/2023)   Exercise Vital Sign    Days of Exercise per Week: 2 days    Minutes of Exercise per Session: 20 min  Stress: No Stress Concern Present (10/09/2023)   [employer] of Occupational Health - Occupational Stress Questionnaire    Feeling of Stress : Not at all  Social Connections: Socially Integrated (10/09/2023)   Social Connection and Isolation Panel [NHANES]    Frequency of Communication with Friends and Family: More than three times a week    Frequency of Social Gatherings with Friends and Family: Once a week    Attends Religious  Services: More than 4 times per year    Active Member of Clubs or Organizations: Yes    Attends Banker Meetings: More than 4 times per year    Marital Status: Married  Catering manager Violence: Not At Risk (07/23/2023)   Humiliation, Afraid, Rape, and Kick questionnaire    Fear of Current or Ex-Partner: No    Emotionally Abused: No    Physically Abused: No    Sexually Abused: No    Family History  Problem Relation Age of Onset   Hypertension Mother    Alcohol abuse Neg Hx    Arthritis Neg Hx    Asthma Neg Hx    Birth defects Neg Hx     Cancer Neg Hx    COPD Neg Hx    Depression Neg Hx    Diabetes Neg Hx    Drug abuse Neg Hx    Early death Neg Hx    Hearing loss Neg Hx    Heart disease Neg Hx    Hyperlipidemia Neg Hx    Learning disabilities Neg Hx    Kidney disease Neg Hx    Mental illness Neg Hx    Mental retardation Neg Hx    Miscarriages / Stillbirths Neg Hx    Stroke Neg Hx    Vision loss Neg Hx     Past Surgical History:  Procedure Laterality Date   APPENDECTOMY     CESAREAN SECTION N/A 01/21/2017   Procedure: CESAREAN SECTION;  Surgeon: Luan Rumpf, MD;  Location: [facility];  Service: Obstetrics;  Laterality: N/A;   CESAREAN SECTION N/A    [employer]rees[employer] 08/30/2020   LAPAROSCOPIC APPENDECTOMY N/A 05/31/2014   Procedure: APPENDECTOMY LAPAROSCOPIC;  Surgeon: Shela Derby, MD;  Location: [facility];  Service: General;  Laterality: N/A;   RECONSTRUCTION TENDON PULLEY HAND Left 07/31/2022   TENOLYSIS Left 06/15/2020   Procedure: Left ring and small finger scar excision and tenolysis of Flexor digitorum superficialis/Flexor digitorum profundus repairs;  Surgeon: Arvil Birks, MD;  [facility];  Service: Orthopedics;  Laterality: Left;  with IV sedation needs 90 minutes    ROS: Review of Systems Negative except as stated above  PHYSICAL EXAM: BP 114/77   Pulse 70   Temp 98.2 F (36.8 C) (Oral)   Resp 16   Ht 5' 5.5" (1.664 m)   Wt 185 lb 12.8 oz (84.3 kg)   LMP 10/03/2023 (Exact Date)   SpO2 98%   BMI 30.45 kg/m   Physical Exam HENT:     Head: Normocephalic and atraumatic.     Nose: Nose normal.     Mouth/Throat:     Mouth: Mucous membranes are moist.     Pharynx: Oropharynx is clear.  Eyes:     Extraocular Movements: Extraocular movements intact.     Conjunctiva/sclera: Conjunctivae normal.     Pupils: Pupils are equal, round, and reactive to light.  Cardiovascular:     Rate and Rhythm: Normal rate and regular rhythm.     Pulses: Normal pulses.     Heart  sounds: Normal heart sounds.  Pulmonary:     Effort: Pulmonary effort is normal.     Breath sounds: Normal breath sounds.  Musculoskeletal:        General: Normal range of motion.     Cervical back: Normal range of motion and neck supple.  Skin:    General: Skin is warm and dry.     Findings: Rash present.  Neurological:  General: No focal deficit present.     Mental Status: She is alert and oriented to person, place, and time.  Psychiatric:        Mood and Affect: Mood normal.        Behavior: Behavior normal.      ASSESSMENT AND PLAN: 1. Perennial allergic rhinitis (Primary) 2. Rash and nonspecific skin eruption - Triamcinolone cream as prescribed. Counseled on medication adherence/adverse effects.  - Referral to Allergy for evaluation/management.  - Follow-up with primary provider as scheduled. - triamcinolone (KENALOG) 0.025 % cream; Apply 1 Application topically 2 (two) times daily.  Dispense: 60 g; Refill: 1 - Ambulatory referral to Allergy  3. Routine eye exam - Referral to Ophthalmology for evaluation/management. - Ambulatory referral to Ophthalmology   Patient was given the opportunity to ask questions.  Patient verbalized understanding of the plan and was able to repeat key elements of the plan. Patient was given clear instructions to go to Emergency Department or return to medical center if symptoms don't improve, worsen, or new problems develop.The patient verbalized understanding.   Orders Placed This Encounter  Procedures   Ambulatory referral to Ophthalmology   Ambulatory referral to Allergy     Requested Prescriptions   Signed Prescriptions Disp Refills   triamcinolone (KENALOG) 0.025 % cream 60 g 1    Sig: Apply 1 Application topically 2 (two) times daily.    Follow-up with primary provider as scheduled.  Senaida Dama, NP

## 2023-10-09 NOTE — Progress Notes (Signed)
 Rash on legs for a  month and a half

## 2023-10-16 ENCOUNTER — Ambulatory Visit: Admitting: Family

## 2023-11-11 NOTE — Progress Notes (Deleted)
 NEUROLOGY CONSULTATION NOTE  Bridget Huffman MRN: [id number] DOB: 29-Jan-1986  Referring provider: Lavona Pounds, NP Primary care provider: Lavona Pounds, NP  Reason for consult:  headache  Assessment/Plan:   ***   Subjective:  Bridget Huffman is a 38 year old ***-handed female with history of preeclampsia who presents for headaches.  History supplemented by referring provider's note.  Onset:  *** Location:  *** Quality:  *** Intensity:  ***.  *** denies new headache, thunderclap headache or severe headache that wakes *** from sleep. Aura:  *** Prodrome:  *** Postdrome:  *** Associated symptoms:  ***.  *** denies associated unilateral numbness or weakness. Duration:  *** Frequency:  *** Frequency of abortive medication: *** Triggers:  *** Relieving factors:  *** Activity:  ***  Past NSAIDS/analgesics:  naproxen , Toradol  IV Past abortive triptans:  sumatriptan  25mg  tab Past abortive ergotamine:  none Past muscle relaxants:  none Past anti-emetic:  Reglan , Zofran , Phenergan  Past antihypertensive medications:  none Past antidepressant medications:  *** Past anticonvulsant medications:  topiramate  25mg  at bedtime Past anti-CGRP:  none Past vitamins/Herbal/Supplements:  *** Past antihistamines/decongestants:  Zyrtec , Benadryl , scopolamine  Other past therapies:  ***  Current NSAIDS/analgesics:  *** Current triptans:  none Current ergotamine:  none Current anti-emetic:  none Current muscle relaxants:  none Current Antihypertensive medications:  none Current Antidepressant medications:  none Current Anticonvulsant medications:  none Current anti-CGRP:  none Current Vitamins/Herbal/Supplements:  MVI Current Antihistamines/Decongestants:  none Other therapy:  none Birth control:  ***   Caffeine:  *** Alcohol:  *** Smoker:  *** Diet:  *** Exercise:  *** Depression:  ***; Anxiety:  *** Other pain:  *** Sleep hygiene:  *** Family history of headache:   ***      PAST MEDICAL HISTORY: Past Medical History:  Diagnosis Date   Hx of preeclampsia, prior pregnancy, currently pregnant    No pertinent past medical history    Pregnancy induced hypertension    previous pregnancy    PAST SURGICAL HISTORY: Past Surgical History:  Procedure Laterality Date   APPENDECTOMY     CESAREAN SECTION N/A 01/21/2017   Procedure: CESAREAN SECTION;  Surgeon: Luan Rumpf, MD;  Location: [facility];  Service: Obstetrics;  Laterality: N/A;   CESAREAN SECTION N/A    [employer]rees[employer] 08/30/2020   LAPAROSCOPIC APPENDECTOMY N/A 05/31/2014   Procedure: APPENDECTOMY LAPAROSCOPIC;  Surgeon: Shela Derby, MD;  Location: [facility];  Service: General;  Laterality: N/A;   RECONSTRUCTION TENDON PULLEY HAND Left 07/31/2022   TENOLYSIS Left 06/15/2020   Procedure: Left ring and small finger scar excision and tenolysis of Flexor digitorum superficialis/Flexor digitorum profundus repairs;  Surgeon: Arvil Birks, MD;  Location: [facility];  Service: Orthopedics;  Laterality: Left;  with IV sedation needs 90 minutes    MEDICATIONS: Current Outpatient Medications on File Prior to Visit  Medication Sig Dispense Refill   cetirizine  (ZYRTEC ) 10 MG tablet Take 1 tablet (10 mg total) by mouth daily. 14 tablet 0   Hydrocortisone  Acetate 1 % OINT Apply 1 Application topically daily. 28.4 g 0   Multiple Vitamin (MULTIVITAMIN WITH MINERALS) TABS tablet Take 1 tablet by mouth daily.     triamcinolone  (KENALOG ) 0.025 % cream Apply 1 Application topically 2 (two) times daily. 60 g 1   No current facility-administered medications on file prior to visit.    ALLERGIES: No Known Allergies  FAMILY HISTORY: Family History  Problem Relation Age of Onset   Hypertension Mother    Alcohol abuse Neg Hx  Arthritis Neg Hx    Asthma Neg Hx    Birth defects Neg Hx    Cancer Neg Hx    COPD Neg Hx    Depression Neg Hx    Diabetes Neg Hx    Drug abuse Neg Hx     Early death Neg Hx    Hearing loss Neg Hx    Heart disease Neg Hx    Hyperlipidemia Neg Hx    Learning disabilities Neg Hx    Kidney disease Neg Hx    Mental illness Neg Hx    Mental retardation Neg Hx    Miscarriages / Stillbirths Neg Hx    Stroke Neg Hx    Vision loss Neg Hx     Objective:  *** General: No acute distress.  Patient appears well-groomed.   Head:  Normocephalic/atraumatic Eyes:  fundi examined but not visualized Neck: supple, no paraspinal tenderness, full range of motion Heart: regular rate and rhythm Vascular: No carotid bruits. Neurological Exam: Mental status: alert and oriented to person, place, and time, speech fluent and not dysarthric, language intact. Cranial nerves: CN I: not tested CN II: pupils equal, round and reactive to light, visual fields intact CN III, IV, VI:  full range of motion, no nystagmus, no ptosis CN V: facial sensation intact. CN VII: upper and lower face symmetric CN VIII: hearing intact CN IX, X: gag intact, uvula midline CN XI: sternocleidomastoid and trapezius muscles intact CN XII: tongue midline Bulk & Tone: normal, no fasciculations. Motor:  muscle strength 5/5 throughout Sensation:  Pinprick and vibratory sensation intact. Deep Tendon Reflexes:  2+ throughout,  toes downgoing.   Finger to nose testing:  Without dysmetria.   Gait:  Normal station and stride.  Romberg negative.    Thank you for allowing me to take part in the care of this patient.  Janne Members, DO  CC: Lavona Pounds, NP

## 2023-11-12 ENCOUNTER — Encounter: Payer: Self-pay | Admitting: Neurology

## 2023-11-12 ENCOUNTER — Ambulatory Visit: Payer: Medicaid Other | Admitting: Neurology

## 2023-11-13 ENCOUNTER — Encounter: Payer: Self-pay | Admitting: Allergy

## 2023-11-13 ENCOUNTER — Other Ambulatory Visit: Payer: Self-pay

## 2023-11-13 ENCOUNTER — Ambulatory Visit (INDEPENDENT_AMBULATORY_CARE_PROVIDER_SITE_OTHER): Payer: Self-pay | Admitting: Allergy

## 2023-11-13 VITALS — BP 114/82 | HR 84 | Temp 98.4°F | Ht 64.57 in | Wt 184.6 lb

## 2023-11-13 DIAGNOSIS — R21 Rash and other nonspecific skin eruption: Secondary | ICD-10-CM | POA: Diagnosis not present

## 2023-11-13 DIAGNOSIS — L2989 Other pruritus: Secondary | ICD-10-CM

## 2023-11-13 DIAGNOSIS — J3089 Other allergic rhinitis: Secondary | ICD-10-CM | POA: Diagnosis not present

## 2023-11-13 NOTE — Progress Notes (Signed)
 New Patient Note  RE: Bridget Huffman MRN: [id number] DOB: 25-Jun-1985 Date of Office Visit: 11/13/2023  Consult requested by: Senaida Dama, NP Primary care provider: Senaida Dama, NP  Chief Complaint: Rash, Seasonal allergy, and Establish Care  History of Present Illness: I had the pleasure of seeing Bridget Huffman for initial evaluation at the [facility] on 11/13/2023. She is a 38 y.o. female, who is referred here by Senaida Dama, NP for the evaluation of rash.  Discussed the use of AI scribe software for clinical note transcription with the patient, who gave verbal consent to proceed.    The rash began in March, lasting about a month, characterized by flat, red, and intensely pruritic lesions on her thighs, arms, legs, and back. Over-the-counter anti-itch sprays and creams provided no relief, and antibiotics prescribed by her primary care physician were ineffective. Excessive scratching led to bruising.  Currently, the rash has improved but still flares up slightly after showers. She has not identified any changes in her environment, diet, or personal care products that could have triggered the rash. She is concerned about possible pollen allergy.   She has no history of similar rashes, asthma, or frequent infections requiring antibiotics. She has experienced seasonal allergies in the winter, characterized by nasal congestion, rhinorrhea, itchy eyes, and sneezing, but has not undergone allergy testing before. She does not have any known allergies to medications, foods, or insect stings.  She visited the emergency room due to severe itching that disrupted her sleep. Blood tests performed at that time were normal (CBC diff and CMP). She has not seen any other specialists for this issue and has not taken any allergy medications recently. Her current medication includes a multivitamin which she has been taking for a long time.  No fever, chills, changes  in appetite, gastrointestinal issues, or respiratory problems. No history of hives or eczema.      Rash started about March 2025 which lasted for about 1 month. This occurred all over her body. Describes them as itchy, red, flat.  Associated symptoms include: none.  Frequency of episodes: daily. Suspected triggers are unknown and now showers make it worse. Denies any fevers, chills, changes in medications, foods, personal care products or recent infections. She has tried the following therapies: zyrtec  with no benefit. Systemic steroids: no. Currently on no daily medications.  Previous work up includes: none. Previous history of rash/hives/itching: no.  09/29/2023 PCP visit: "- States rash of bilateral legs causing itching for several weeks. Denies red flag symptoms. Using over-the-counter cortisone ointment with minimal relief. States thinks may be related to allergies and requests referral to specialist.  - Reports right eye blurry vision. Denies red flag symptoms. Requests referral to eye doctor."  09/08/2023 ER visit: "Bridget Huffman is a 38 y.o. female who presents to the Emergency Department complaining of rash.  She presents to the emergency department for evaluation of 1 week of itchy rash to her inner thighs.  She went to urgent care yesterday and was prescribed Keflex  and prednisone , which she started taking today.  She states that now there is some rash on her arm and she has bruising to her outer legs and she became concerned, which prompted ED visit.  No fever, chest pain, shortness of breath, nausea, vomiting.  No new detergents, creams, foods.  No one in the home has similar rash.  No hot tub use.   Patient here for evaluation of 1 week of itchy rash,  started Keflex  and prednisone  today but also noticed bruising today.  She does have bruising on the outer thighs, suspect this pattern is secondary to scratching.  No evidence of coagulopathy.  CBC with mild leukocytosis, suspect  this is demargination secondary to prednisone  use.  No evidence of acute infectious process.  No evidence of SJS/TEN, EM, impetigo.  Discussed home care for rash, will prescribe cetirizine .  Discussed outpatient follow-up and return precautions. "  Assessment and Plan: Carmesha is a 38 y.o. female with: Rash and other nonspecific skin eruption Other pruritus Rash/itching improved but recurs mildly post-shower. No clear trigger identified. Environmental allergy considered. ER visit unremarkable CBC and CMP. Unclear etiology.  Keep track of rashes and take pictures. Write down what you had done during flares.  See below for proper skin care. Use fragrance free and dye free products. No dryer sheets or fabric softener.   Return for allergy skin testing (1-55 and select foods). If testing unremarkable consider additional labs.  Other allergic rhinitis Symptomatic mainly in the winter. No prior testing.  Return for Skin testing.  No orders of the defined types were placed in this encounter.  Lab Orders  No laboratory test(s) ordered today    Other allergy screening: Asthma: no Rhino conjunctivitis: yes Some symptoms in the winter. No prior allergy testing.   Food allergy: no Medication allergy: no Hymenoptera allergy: no Urticaria: no Eczema:no History of recurrent infections suggestive of immunodeficency: no  Diagnostics: None.   Past Medical History: 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   Encounter for orthopedic follow-up care 12/29/2019   Laceration of left hand 12/21/2019   Pain in finger of left hand 12/15/2019   Decreased fetal movement 01/20/2017   Perforated appendicitis 06/01/2014   Acute appendicitis 05/31/2014   Annual physical exam 02/09/2014   Pap smear for cervical cancer screening 02/09/2014   NSVD (normal spontaneous vaginal delivery) 03/29/2012   Mild preeclampsia delivered 03/29/2012   GBS (group B  streptococcus) UTI complicating pregnancy 08/21/2011   Past Medical History:  Diagnosis Date   Hx of preeclampsia, prior pregnancy, currently pregnant    No pertinent past medical history    Pregnancy induced hypertension    previous pregnancy   Past Surgical History: Past Surgical History:  Procedure Laterality Date   APPENDECTOMY     CESAREAN SECTION N/A 01/21/2017   Procedure: CESAREAN SECTION;  Surgeon: Luan Rumpf, MD;  [facility];  Service: Obstetrics;  Laterality: N/A;   CESAREAN SECTION N/A    Ph[employer] 08/30/2020   LAPAROSCOPIC APPENDECTOMY N/A 05/31/2014   Procedure: APPENDECTOMY LAPAROSCOPIC;  Surgeon: Shela Derby, MD;  Location: [facility] OR;  Service: General;  Laterality: N/A;   RECONSTRUCTION TENDON PULLEY HAND Left 07/31/2022   TENOLYSIS Left 06/15/2020   Procedure: Left ring and small finger scar excision and tenolysis of Flexor digitorum superficialis/Flexor digitorum profundus repairs;  Surgeon: Arvil Birks, MD;  Location: [facility];  Service: Orthopedics;  Laterality: Left;  with IV sedation needs 90 minutes   Medication List:  Current Outpatient Medications  Medication Sig Dispense Refill   Multiple Vitamin (MULTIVITAMIN WITH MINERALS) TABS tablet Take 1 tablet by mouth daily.     triamcinolone  (KENALOG ) 0.025 % cream Apply 1 Application topically 2 (two) times daily. 60 g 1   cetirizine  (ZYRTEC ) 10 MG tablet Take 1 tablet (10 mg total) by mouth daily. 14 tablet 0   No current facility-administered medications for this visit.  Allergies: No Known Allergies Social History: Social History   Socioeconomic History   Marital status: Married    Spouse name: Not on file   Number of children: Not on file   Years of education: Not on file   Highest education level: GED or equivalent  Occupational History   Not on file  Tobacco Use   Smoking status: Never    Passive exposure: Never   Smokeless tobacco: Never  Vaping Use    Vaping status: Never Used  Substance and Sexual Activity   Alcohol use: No   Drug use: No   Sexual activity: Yes    Birth control/protection: Condom  Other Topics Concern   Not on file  Social History Narrative   Not on file   Social Drivers of Health   Financial Resource Strain: Low Risk  (10/09/2023)   Overall Financial Resource Strain (CARDIA)    Difficulty of Paying Living Expenses: Not very hard  Food Insecurity: No Food Insecurity (10/09/2023)   Hunger Vital Sign    Worried About Running Out of Food in the Last Year: Never true    Ran Out of Food in the Last Year: Never true  Transportation Needs: No Transportation Needs (10/09/2023)   PRAPARE - Administrator, Civil Service (Medical): No    Lack of Transportation (Non-Medical): No  Physical Activity: Insufficiently Active (10/09/2023)   Exercise Vital Sign    Days of Exercise per Week: 2 days    Minutes of Exercise per Session: 20 min  Stress: No Stress Concern Present (10/09/2023)   [employer] of Occupational Health - Occupational Stress Questionnaire    Feeling of Stress : Not at all  Social Connections: Socially Integrated (10/09/2023)   Social Connection and Isolation Panel [NHANES]    Frequency of Communication with Friends and Family: More than three times a week    Frequency of Social Gatherings with Friends and Family: Once a week    Attends Religious Services: More than 4 times per year    Active Member of [employer] or Organizations: Yes    Attends Engineer, structural: More than 4 times per year    Marital Status: Married   Lives in a house. Smoking: denies Occupation: Cytogeneticist HistorySurveyor, minerals in the house: no Engineer, civil (consulting) in the family room: yes Carpet in the bedroom: yes Heating: electric Cooling: central Pet: no  Family History: Family History  Problem Relation Age of Onset   Asthma Mother    Hypertension Mother    Alcohol abuse Neg Hx     Arthritis Neg Hx    Birth defects Neg Hx    Cancer Neg Hx    COPD Neg Hx    Depression Neg Hx    Diabetes Neg Hx    Drug abuse Neg Hx    Early death Neg Hx    Hearing loss Neg Hx    Heart disease Neg Hx    Hyperlipidemia Neg Hx    Learning disabilities Neg Hx    Kidney disease Neg Hx    Mental illness Neg Hx    Mental retardation Neg Hx    Miscarriages / Stillbirths Neg Hx    Stroke Neg Hx    Vision loss Neg Hx    Review of Systems  Constitutional:  Negative for appetite change, chills, fever and unexpected weight change.  HENT:  Negative for congestion and rhinorrhea.   Eyes:  Negative for itching.  Respiratory:  Negative for  cough, chest tightness, shortness of breath and wheezing.   Cardiovascular:  Negative for chest pain.  Gastrointestinal:  Negative for abdominal pain.  Genitourinary:  Negative for difficulty urinating.  Skin:  Negative for rash.       Pruritus, rash  Neurological:  Negative for headaches.    Objective: BP 114/82 (BP Location: Right Arm, Patient Position: Sitting, Cuff Size: Normal)   Pulse 84   Temp 98.4 F (36.9 C) (Temporal)   Ht 5' 4.57" (1.64 m)   Wt 184 lb 9.6 oz (83.7 kg)   SpO2 99%   BMI 31.13 kg/m  Body mass index is 31.13 kg/m. Physical Exam Vitals and nursing note reviewed.  Constitutional:      Appearance: Normal appearance. She is well-developed.  HENT:     Head: Normocephalic and atraumatic.     Right Ear: Tympanic membrane and external ear normal.     Left Ear: Tympanic membrane and external ear normal.     Nose: Nose normal.     Mouth/Throat:     Mouth: Mucous membranes are moist.     Pharynx: Oropharynx is clear.  Eyes:     Conjunctiva/sclera: Conjunctivae normal.  Cardiovascular:     Rate and Rhythm: Normal rate and regular rhythm.     Heart sounds: Normal heart sounds. No murmur heard.    No friction rub. No gallop.  Pulmonary:     Effort: Pulmonary effort is normal.     Breath sounds: Normal breath sounds. No  wheezing, rhonchi or rales.  Musculoskeletal:     Cervical back: Neck supple.  Skin:    General: Skin is warm.     Findings: No rash.  Neurological:     Mental Status: She is alert and oriented to person, place, and time.  Psychiatric:        Behavior: Behavior normal.   The plan was reviewed with the patient/family, and all questions/concerned were addressed.  It was my pleasure to see Kaitlyn today and participate in her care. Please feel free to contact me with any questions or concerns.  Sincerely,  Eudelia Hero, DO Allergy & Immunology  [facility]  [facility] office: [phone] [facility] office: [phone]

## 2023-11-13 NOTE — Patient Instructions (Addendum)
 Rash I'm not sure what caused the rash.  Keep track of rashes and take pictures. Write down what you had done during flares.  See below for proper skin care. Use fragrance free and dye free products. No dryer sheets or fabric softener.    Return for allergy skin testing. Make sure you don't take any antihistamines for 3 days before the skin testing appointment. Don't put any lotion on the back and arms on the day of testing.  Plan on being here for 30-60 minutes.   Follow up for skin testing  Skin care recommendations  Bath time: Always use lukewarm water. AVOID very hot or cold water. Keep bathing time to 5-10 minutes. Do NOT use bubble bath. Use a mild soap and use just enough to wash the dirty areas. Do NOT scrub skin vigorously.  After bathing, pat dry your skin with a towel. Do NOT rub or scrub the skin.  Moisturizers and prescriptions:  ALWAYS apply moisturizers immediately after bathing (within 3 minutes). This helps to lock-in moisture. Use the moisturizer several times a day over the whole body. Good summer moisturizers include: Aveeno, CeraVe, Cetaphil. Good winter moisturizers include: Aquaphor, Vaseline, Cerave, Cetaphil, Eucerin, Vanicream. When using moisturizers along with medications, the moisturizer should be applied about one hour after applying the medication to prevent diluting effect of the medication or moisturize around where you applied the medications. When not using medications, the moisturizer can be continued twice daily as maintenance.  Laundry and clothing: Avoid laundry products with added color or perfumes. Use unscented hypo-allergenic laundry products such as Tide free, Cheer free & gentle, and All free and clear.  If the skin still seems dry or sensitive, you can try double-rinsing the clothes. Avoid tight or scratchy clothing such as wool. Do not use fabric softeners or dyer sheets.

## 2023-11-27 ENCOUNTER — Ambulatory Visit: Admitting: Allergy

## 2023-11-27 ENCOUNTER — Encounter: Payer: Self-pay | Admitting: Allergy

## 2023-11-27 DIAGNOSIS — L509 Urticaria, unspecified: Secondary | ICD-10-CM

## 2023-11-27 DIAGNOSIS — L2989 Other pruritus: Secondary | ICD-10-CM | POA: Diagnosis not present

## 2023-11-27 DIAGNOSIS — R21 Rash and other nonspecific skin eruption: Secondary | ICD-10-CM | POA: Diagnosis not present

## 2023-11-27 DIAGNOSIS — J3089 Other allergic rhinitis: Secondary | ICD-10-CM

## 2023-11-27 NOTE — Progress Notes (Signed)
 Skin testing note  RE: Bridget Huffman MRN: [id number] DOB: [birth year] Date of Office Visit: 11/27/2023  Referring provider: Senaida Dama, NP Primary care provider: Senaida Dama, NP  Chief Complaint: skin testing  History of Present Illness: I had the pleasure of seeing Bridget Huffman for a skin testing visit at the [facility] on 11/27/2023. She is a 38 y.o. female, who is being followed for pruritic rash, allergic rhinitis. Her previous allergy office visit was on 11/13/2023 with Dr. Burdette Carolin. Today is a skin testing visit.   Discussed the use of AI scribe software for clinical note transcription with the patient, who gave verbal consent to proceed.    She experiences itching, particularly after taking a shower, which sometimes results in red, hive-like rashes. The itching is not as severe as it was previously, but she experienced significant itching yesterday.  She has undergone allergy testing, which showed positive results for some pollen. Additionally, she tested slightly positive for shellfish, but she has not noticed increased itching after consuming shellfish.  She is not currently taking any medication for her symptoms.     Assessment and Plan: Bridget Huffman is a 38 y.o. female with: Other allergic rhinitis Today's skin testing positive to weed, ragweed, trees. Borderline to grass. Start environmental control measures as below. I'm not sure how much of this is causing your rash/itching.  Rash and other nonspecific skin eruption Other pruritus Possible urticaria Past history - Rash/itching improved but recurs mildly post-shower. No clear trigger identified. ER visit unremarkable CBC and CMP. Today's skin testing positive to weed, ragweed, trees. Borderline to grass. Borderline to shellfish mix. Negative to other foods. Tolerates shellfish with no issues - okay to continue to consume.  Keep track of rashes and take pictures. Write down what you had  done during flares.  Start zyrtec  (cetirizine ) 10mg  OR allegra (fexofenadine) 180mg  once a day and make take twice a day if needed. Avoid the following potential triggers: alcohol, tight clothing, NSAIDs, hot showers and getting overheated. See below for proper skin care.  Get bloodwork.  Return in about 2 months (around 01/27/2024).  No orders of the defined types were placed in this encounter.  Lab Orders         Alpha-Gal Panel         ANA, IFA (with reflex)         CBC with Differential/Platelet         Chronic Urticaria         Comprehensive metabolic panel with GFR         C-reactive protein         Sedimentation rate         Tryptase         Thyroid  Cascade Profile      Diagnostics: Skin Testing: Environmental allergy panel and select foods. Today's skin testing positive to weed, ragweed, trees. Borderline to grass. Borderline to shellfish mix. Negative to other foods.  Results discussed with patient/family.  Airborne Adult Perc - 11/27/23 1423     Time Antigen Placed 0222    Allergen Manufacturer Floyd Hutchinson    Location Back    Number of Test 55    1. Control-Buffer 50% Glycerol Negative    2. Control-Histamine 3+    3. [place] --   +/-   4. [place] Negative    5. Johnson Negative   +/-   6. [place]  Blue Negative    7. Meadow Fescue Negative  8. Perennial Rye Negative    9. Timothy Negative    10. Ragweed Mix Negative    11. Cocklebur Negative    12. Plantain,  English Negative    13. Baccharis Negative    14. Dog Fennel 2+    15. Russian Thistle Negative    16. Lamb's Quarters Negative    17. Sheep Sorrell Negative    18. Rough Pigweed Negative    19. Marsh Elder, Rough Negative    20. Mugwort, Common Negative    21. Box, Elder Negative    22. Cedar, red Negative    23. Sweet Gum Negative    24. Pecan Pollen Negative    25. Pine Mix Negative    26. Walnut, Black Pollen Negative    27. Red Mulberry Negative    28. Ash Mix Negative    29. Birch Mix  Negative    30. Beech American Negative    31. Cottonwood, [place] Negative    32. Hickory, White 3+    33. Maple Mix Negative    34. Oak, [place] Mix Negative    35. Sycamore Eastern Negative    36. Alternaria Alternata Negative    37. Cladosporium Herbarum Negative    38. Aspergillus Mix Negative    39. Penicillium Mix Negative    40. Bipolaris Sorokiniana (Helminthosporium) Negative    41. Drechslera Spicifera (Curvularia) Negative    42. Mucor Plumbeus Negative    43. Fusarium Moniliforme Negative    44. Aureobasidium Pullulans (pullulara) Negative    45. Rhizopus Oryzae Negative    46. Botrytis Cinera Negative    47. Epicoccum Nigrum Negative    48. Phoma Betae Negative    49. Dust Mite Mix Negative    50. Cat Hair 10,000 BAU/ml Negative    51.  Dog Epithelia Negative    52. Mixed Feathers Negative    53. Horse Epithelia Negative    54. Cockroach, German Negative    55. Tobacco Leaf Negative          13 Food Perc - 11/27/23 1424       Test Information   Time Antigen Placed 0222    Allergen Manufacturer Floyd Hutchinson    Location Back    Number of allergen test 13      Food   1. Peanut Negative    2. Soybean Negative    3. Wheat Negative    4. Sesame Negative    5. Milk, Cow Negative    6. Casein Negative    7. Egg White, Chicken Negative    8. Shellfish Mix --   +/-   9. Fish Mix Negative    10. Cashew Negative    11. Walnut Food Negative    12. Almond Negative    13. Hazelnut Negative          Intradermal - 11/27/23 1437     Time Antigen Placed 1437    Allergen Manufacturer Floyd Hutchinson    Location Arm    Number of Test 13    Control Negative    [place] Territ[place] Negative    7 Grass Negative    Ragweed Mix 3+    Weed Mix 3+    Mold 1 Negative    Mold 2 Negative    Mold 3 Negative    Mold 4 Negative    Mite Mix Negative    Cat Negative    Dog Negative    Cockroach Negative  Previous notes and tests were reviewed. The plan was reviewed with the  patient/family, and all questions/concerned were addressed.  It was my pleasure to see Bridget Huffman today and participate in her care. Please feel free to contact me with any questions or concerns.  Sincerely,  Eudelia Hero, DO Allergy & Immunology  [facility]  [place] office: [phone] [facility] office: [phone]

## 2023-11-27 NOTE — Patient Instructions (Addendum)
 Today's skin testing positive to weed, ragweed, trees. Borderline to grass. Borderline to shellfish mix. Negative to other foods.   Results given.  Environmental allergies Start environmental control measures as below. I'm not sure how much of this is causing your rash/itching.  Rash Keep track of rashes and take pictures. Write down what you had done during flares.   Start zyrtec  (cetirizine ) 10mg  OR allegra (fexofenadine) 180mg  once a day and make take twice a day if needed. Avoid the following potential triggers: alcohol, tight clothing, NSAIDs, hot showers and getting overheated. See below for proper skin care.   Get bloodwork. We are ordering labs, so please allow 1-2 weeks for the results to come back. With the newly implemented Cures Act, the labs might be visible to you at the same time that they become visible to me. However, I will not address the results until all of the results are back, so please be patient.   Return in about 2 months (around 01/27/2024). Or sooner if needed.   Reducing Pollen Exposure Pollen seasons: trees (spring), grass (summer) and ragweed/weeds (fall). Keep windows closed in your home and car to lower pollen exposure.  Install air conditioning in the bedroom and throughout the house if possible.  Avoid going out in dry windy days - especially early morning. Pollen counts are highest between 5 - 10 AM and on dry, hot and windy days.  Save outside activities for late afternoon or after a heavy rain, when pollen levels are lower.  Avoid mowing of grass if you have grass pollen allergy. Be aware that pollen can also be transported indoors on people and pets.  Dry your clothes in an automatic dryer rather than hanging them outside where they might collect pollen.  Rinse hair and eyes before bedtime.  Skin care recommendations  Bath time: Always use lukewarm water. AVOID very hot or cold water. Keep bathing time to 5-10 minutes. Do NOT use bubble  bath. Use a mild soap and use just enough to wash the dirty areas. Do NOT scrub skin vigorously.  After bathing, pat dry your skin with a towel. Do NOT rub or scrub the skin.  Moisturizers and prescriptions:  ALWAYS apply moisturizers immediately after bathing (within 3 minutes). This helps to lock-in moisture. Use the moisturizer several times a day over the whole body. Good summer moisturizers include: Aveeno, CeraVe, Cetaphil. Good winter moisturizers include: Aquaphor, Vaseline, Cerave, Cetaphil, Eucerin, Vanicream. When using moisturizers along with medications, the moisturizer should be applied about one hour after applying the medication to prevent diluting effect of the medication or moisturize around where you applied the medications. When not using medications, the moisturizer can be continued twice daily as maintenance.  Laundry and clothing: Avoid laundry products with added color or perfumes. Use unscented hypo-allergenic laundry products such as Tide free, Cheer free & gentle, and All free and clear.  If the skin still seems dry or sensitive, you can try double-rinsing the clothes. Avoid tight or scratchy clothing such as wool. Do not use fabric softeners or dyer sheets.

## 2023-12-03 LAB — FANA STAINING PATTERNS: Speckled Pattern: 1:320 {titer} — ABNORMAL HIGH

## 2023-12-07 LAB — CBC WITH DIFFERENTIAL/PLATELET
Basophils Absolute: 0 10*3/uL (ref 0.0–0.2)
Basos: 1 %
EOS (ABSOLUTE): 0.6 10*3/uL — ABNORMAL HIGH (ref 0.0–0.4)
Eos: 7 %
Hematocrit: 44.2 % (ref 34.0–46.6)
Hemoglobin: 14.7 g/dL (ref 11.1–15.9)
Immature Grans (Abs): 0 10*3/uL (ref 0.0–0.1)
Immature Granulocytes: 0 %
Lymphocytes Absolute: 3 10*3/uL (ref 0.7–3.1)
Lymphs: 35 %
MCH: 31 pg (ref 26.6–33.0)
MCHC: 33.3 g/dL (ref 31.5–35.7)
MCV: 93 fL (ref 79–97)
Monocytes Absolute: 0.5 10*3/uL (ref 0.1–0.9)
Monocytes: 6 %
Neutrophils Absolute: 4.3 10*3/uL (ref 1.4–7.0)
Neutrophils: 51 %
Platelets: 251 10*3/uL (ref 150–450)
RBC: 4.74 x10E6/uL (ref 3.77–5.28)
RDW: 12 % (ref 11.7–15.4)
WBC: 8.5 10*3/uL (ref 3.4–10.8)

## 2023-12-07 LAB — COMPREHENSIVE METABOLIC PANEL WITH GFR
ALT: 19 IU/L (ref 0–32)
AST: 19 IU/L (ref 0–40)
Albumin: 4.3 g/dL (ref 3.9–4.9)
Alkaline Phosphatase: 65 IU/L (ref 44–121)
BUN/Creatinine Ratio: 17 (ref 9–23)
BUN: 12 mg/dL (ref 6–20)
Bilirubin Total: 0.2 mg/dL (ref 0.0–1.2)
CO2: 21 mmol/L (ref 20–29)
Calcium: 9.2 mg/dL (ref 8.7–10.2)
Chloride: 103 mmol/L (ref 96–106)
Creatinine, Ser: 0.71 mg/dL (ref 0.57–1.00)
Globulin, Total: 2.5 g/dL (ref 1.5–4.5)
Glucose: 90 mg/dL (ref 70–99)
Potassium: 4.4 mmol/L (ref 3.5–5.2)
Sodium: 138 mmol/L (ref 134–144)
Total Protein: 6.8 g/dL (ref 6.0–8.5)
eGFR: 112 mL/min/{1.73_m2} (ref >59)

## 2023-12-07 LAB — TRYPTASE: Tryptase: 2.4 ug/L (ref 2.2–13.2)

## 2023-12-07 LAB — ALPHA-GAL PANEL
Allergen Lamb IgE: <0.1 kU/L
Beef IgE: <0.1 kU/L
IgE (Immunoglobulin E), Serum: 48 [IU]/mL (ref 6–495)
O215-IgE Alpha-Gal: <0.1 kU/L
Pork IgE: <0.1 kU/L

## 2023-12-07 LAB — C-REACTIVE PROTEIN: CRP: <1 mg/L (ref 0–10)

## 2023-12-07 LAB — THYROID CASCADE PROFILE: TSH: 2.13 u[IU]/mL (ref 0.450–4.500)

## 2023-12-07 LAB — SEDIMENTATION RATE: Sed Rate: 2 mm/h (ref 0–32)

## 2023-12-07 LAB — ANTINUCLEAR ANTIBODIES, IFA: ANA Titer 1: POSITIVE — AB

## 2023-12-07 LAB — CHRONIC URTICARIA PD-BAT: Pooled Donor- BAT CU: 2 (ref 0.00–10.60)

## 2023-12-08 ENCOUNTER — Ambulatory Visit: Payer: Self-pay | Admitting: Allergy

## 2023-12-08 ENCOUNTER — Telehealth: Payer: Self-pay | Admitting: Allergy

## 2023-12-08 NOTE — Telephone Encounter (Signed)
 Please place referral to rheumatology for elevated ANA 1:320 speckled pattern. History of rash with no known triggers.

## 2024-01-01 NOTE — Telephone Encounter (Signed)
 Bridget Huffman has been scheduled for 01/30/2024 at 1:00 pm with Dr. Jayme

## 2024-01-27 ENCOUNTER — Ambulatory Visit: Admitting: Allergy

## 2024-01-27 NOTE — Progress Notes (Deleted)
 Follow Up Note  RE: Bridget Huffman MRN: [id number] DOB: 11-Apr-1986 Date of Office Visit: 01/27/2024  Referring provider: Lorren Greig PARAS, NP Primary care provider: Lorren Greig PARAS, NP  Chief Complaint: No chief complaint on file.  History of Present Illness: I had the pleasure of seeing Bridget Huffman for a follow up visit at the [facility] on 01/27/2024. She is a 38 y.o. female, who is being followed for allergic rhinitis and rash. Her previous allergy  office visit was on 11/27/2023 with Dr. Luke. Today is a regular follow up visit.  Discussed the use of AI scribe software for clinical note transcription with the patient, who gave verbal consent to proceed.  History of Present Illness            2025 labs: Blood count, kidney function, liver function, electrolytes, thyroid , inflammation markers, chronic urticaria index (checks for autoantibodies that trigger mast cells), tryptase (checks for mast cell issues) and alpha gal (checks for red meat allergy ) were all normal which is great.    Your autoimmune screener was positive with 1:320 speckled pattern. I will refer you to rheumatology for further evaluation.    Keep taking antihistamines as before.   Assessment and Plan: Bridget Huffman is a 38 y.o. female with: Seasonal allergic rhinitis due to pollen Past history - 2025 skin testing positive to weed, ragweed, trees. Borderline to grass. Start environmental control measures as below. I'm not sure how much of this is causing your rash/itching.   Rash and other nonspecific skin eruption Other pruritus Possible urticaria Past history - Rash/itching improved but recurs mildly post-shower. No clear trigger identified. ER visit unremarkable CBC and CMP. Today's skin testing positive to weed, ragweed, trees. Borderline to grass. Borderline to shellfish mix. Negative to other foods. Tolerates shellfish with no issues - okay to continue to consume.  Keep  track of rashes and take pictures. Write down what you had done during flares.  Start zyrtec  (cetirizine ) 10mg  OR allegra (fexofenadine) 180mg  once a day and make take twice a day if needed. Avoid the following potential triggers: alcohol, tight clothing, NSAIDs, hot showers and getting overheated. See below for proper skin care.  Get bloodwork. Assessment and Plan              No follow-ups on file.  No orders of the defined types were placed in this encounter.  Lab Orders  No laboratory test(s) ordered today    Diagnostics: Spirometry:  Tracings reviewed. Her effort: {Blank single:19197::Good reproducible efforts.,It was hard to get consistent efforts and there is a question as to whether this reflects a maximal maneuver.,Poor effort, data can not be interpreted.} FVC: ***L FEV1: ***L, ***% predicted FEV1/FVC ratio: ***% Interpretation: {Blank single:19197::Spirometry consistent with mild obstructive disease,Spirometry consistent with moderate obstructive disease,Spirometry consistent with severe obstructive disease,Spirometry consistent with possible restrictive disease,Spirometry consistent with mixed obstructive and restrictive disease,Spirometry uninterpretable due to technique,Spirometry consistent with normal pattern,No overt abnormalities noted given today's efforts}.  Please see scanned spirometry results for details.  Skin Testing: {Blank single:19197::Select foods,Environmental allergy  panel,Environmental allergy  panel and select foods,Food allergy  panel,None,Deferred due to recent antihistamines use}. *** Results discussed with patient/family.   Medication List:  Current Outpatient Medications  Medication Sig Dispense Refill   cetirizine  (ZYRTEC ) 10 MG tablet Take 1 tablet (10 mg total) by mouth daily. 14 tablet 0   Multiple Vitamin (MULTIVITAMIN WITH MINERALS) TABS tablet Take 1 tablet by mouth daily.     triamcinolone  (KENALOG )  0.025 %  cream Apply 1 Application topically 2 (two) times daily. 60 g 1   No current facility-administered medications for this visit.   Allergies: No Known Allergies I reviewed her past medical history, social history, family history, and environmental history and no significant changes have been reported from her previous visit.  Review of Systems  Constitutional:  Negative for appetite change, chills, fever and unexpected weight change.  HENT:  Negative for congestion and rhinorrhea.   Eyes:  Negative for itching.  Respiratory:  Negative for cough, chest tightness, shortness of breath and wheezing.   Cardiovascular:  Negative for chest pain.  Gastrointestinal:  Negative for abdominal pain.  Genitourinary:  Negative for difficulty urinating.  Skin:  Negative for rash.       Pruritus, rash  Allergic/Immunologic: Positive for environmental allergies.  Neurological:  Negative for headaches.    Objective: There were no vitals taken for this visit. There is no height or weight on file to calculate BMI. Physical Exam Vitals and nursing note reviewed.  Constitutional:      Appearance: Normal appearance. She is well-developed.  HENT:     Head: Normocephalic and atraumatic.     Right Ear: Tympanic membrane and external ear normal.     Left Ear: Tympanic membrane and external ear normal.     Nose: Nose normal.     Mouth/Throat:     Mouth: Mucous membranes are moist.     Pharynx: Oropharynx is clear.  Eyes:     Conjunctiva/sclera: Conjunctivae normal.  Cardiovascular:     Rate and Rhythm: Normal rate and regular rhythm.     Heart sounds: Normal heart sounds. No murmur heard.    No friction rub. No gallop.  Pulmonary:     Effort: Pulmonary effort is normal.     Breath sounds: Normal breath sounds. No wheezing, rhonchi or rales.  Musculoskeletal:     Cervical back: Neck supple.  Skin:    General: Skin is warm.     Findings: No rash.  Neurological:     Mental Status: She is  alert and oriented to person, place, and time.  Psychiatric:        Behavior: Behavior normal.    Previous notes and tests were reviewed. The plan was reviewed with the patient/family, and all questions/concerned were addressed.  It was my pleasure to see Bridget Huffman today and participate in her care. Please feel free to contact me with any questions or concerns.  Sincerely,  Orlan Cramp, DO Allergy  & Immunology  [facility]  [place] office: [phone] [facility] office: [phone]

## 2024-01-30 ENCOUNTER — Encounter

## 2024-01-30 NOTE — Progress Notes (Deleted)
 Office Visit Note  Patient: Bridget Huffman             Date of Birth: Jun 30, 1985           MRN: [id number]             PCP: Lorren Greig PARAS, NP Referring: Luke Orlan HERO, DO Visit Date: 01/30/2024 Occupation: @GUAROCC @  Subjective:  No chief complaint on file.    History of Present Illness: Bridget Huffman is a 38 y.o. female ***   Activities of Daily Living:  Patient reports morning stiffness for *** {minute/hour:19697}.   Patient {ACTIONS;DENIES/REPORTS:21021675::Denies} nocturnal pain.  Difficulty dressing/grooming: {ACTIONS;DENIES/REPORTS:21021675::Denies} Difficulty climbing stairs: {ACTIONS;DENIES/REPORTS:21021675::Denies} Difficulty getting out of chair: {ACTIONS;DENIES/REPORTS:21021675::Denies} Difficulty using hands for taps, buttons, cutlery, and/or writing: {ACTIONS;DENIES/REPORTS:21021675::Denies}  No Rheumatology ROS completed.    Rheum History: # Diagnosed in ***.  Manifestation of disease:   Serologies: (+) *** (-) ***  Maintenance Labs: QuantiFERON: *** Hepatitis panel: ***  Current Treatment ***  Prior Treatments ***   PMFS History:  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   Encounter for orthopedic follow-up care 12/29/2019   Laceration of left hand 12/21/2019   Pain in finger of left hand 12/15/2019   Decreased fetal movement 01/20/2017   Perforated appendicitis 06/01/2014   Acute appendicitis 05/31/2014   Annual physical exam 02/09/2014   Pap smear for cervical cancer screening 02/09/2014   NSVD (normal spontaneous vaginal delivery) 03/29/2012   Mild preeclampsia delivered 03/29/2012   GBS (group B streptococcus) UTI complicating pregnancy 08/21/2011    Past Medical History:  Diagnosis Date   Hx of preeclampsia, prior pregnancy, currently pregnant    No pertinent past medical history    Pregnancy induced hypertension    previous pregnancy    Family History  Problem  Relation Age of Onset   Asthma Mother    Hypertension Mother    Alcohol abuse Neg Hx    Arthritis Neg Hx    Birth defects Neg Hx    Cancer Neg Hx    COPD Neg Hx    Depression Neg Hx    Diabetes Neg Hx    Drug abuse Neg Hx    Early death Neg Hx    Hearing loss Neg Hx    Heart disease Neg Hx    Hyperlipidemia Neg Hx    Learning disabilities Neg Hx    Kidney disease Neg Hx    Mental illness Neg Hx    Mental retardation Neg Hx    Miscarriages / Stillbirths Neg Hx    Stroke Neg Hx    Vision loss Neg Hx    Past Surgical History:  Procedure Laterality Date   APPENDECTOMY     CESAREAN SECTION N/A 01/21/2017   Procedure: CESAREAN SECTION;  Surgeon: Gretta Gums, MD;  Location: [facility];  Service: Obstetrics;  Laterality: N/A;   CESAREAN SECTION N/A    [employer] 08/30/2020   LAPAROSCOPIC APPENDECTOMY N/A 05/31/2014   Procedure: APPENDECTOMY LAPAROSCOPIC;  Surgeon: Lynda Leos, MD;  Location: [facility];  Service: General;  Laterality: N/A;   RECONSTRUCTION TENDON PULLEY HAND Left 07/31/2022   TENOLYSIS Left 06/15/2020   Procedure: Left ring and small finger scar excision and tenolysis of Flexor digitorum superficialis/Flexor digitorum profundus repairs;  Surgeon: Shari Easter, MD;  Location: [facility];  Service: Orthopedics;  Laterality: Left;  with IV sedation needs 90 minutes   Social History   Social History Narrative   Not on  file   Immunization History  Administered Date(s) Administered   Influenza Split 03/30/2012   MMR 03/30/2012   Rho (D) Immune Globulin  08/10/2011, 03/30/2012   Tdap 03/29/2012, 12/13/2019     Objective: Vital Signs: There were no vitals taken for this visit.   Physical Exam   Musculoskeletal Exam: ***  CDAI Exam: CDAI Score: -- Patient Global: --; Provider Global: -- Swollen: --; Tender: -- Joint Exam 01/30/2024   No joint exam has been documented for this visit   There is currently no information documented on  the homunculus. Go to the Rheumatology activity and complete the homunculus joint exam.  Investigation: No additional findings.  Imaging: No results found.  Recent Labs: Lab Results  Component Value Date   WBC 8.5 11/27/2023   HGB 14.7 11/27/2023   PLT 251 11/27/2023   NA 138 11/27/2023   K 4.4 11/27/2023   CL 103 11/27/2023   CO2 21 11/27/2023   GLUCOSE 90 11/27/2023   BUN 12 11/27/2023   CREATININE 0.71 11/27/2023   BILITOT 0.2 11/27/2023   ALKPHOS 65 11/27/2023   AST 19 11/27/2023   ALT 19 11/27/2023   PROT 6.8 11/27/2023   ALBUMIN 4.3 11/27/2023   CALCIUM 9.2 11/27/2023   GFRAA 132 03/22/2020    Speciality Comments: No specialty comments available.  Procedures:  No procedures performed Allergies: Patient has no known allergies.   Assessment / Plan:     Visit Diagnoses: No diagnosis found.  #High risk medication use  Orders: No orders of the defined types were placed in this encounter.  No orders of the defined types were placed in this encounter.   Face-to-face time spent with patient was *** minutes. Greater than 50% of time was spent in counseling and coordination of care.  Follow-Up Instructions: No follow-ups on file.   Asberry Claw, DO

## 2024-04-28 ENCOUNTER — Telehealth: Payer: Self-pay

## 2024-04-28 NOTE — Telephone Encounter (Signed)
 Received [facility] clarification of diagnosis for date of service 11/27/2023. Cerified L50.9. Dr Luke added R21, L29.9 and L30.9 faxed back 11/13 at 4:58pm  to [phone] and sent to scan center.
